# Patient Record
Sex: Male | Born: 1935
Health system: Southern US, Community
[De-identification: ages and names within clinical notes are randomized; demographics above are authoritative.]

## PROBLEM LIST (undated history)

## (undated) DIAGNOSIS — C61 Malignant neoplasm of prostate: Secondary | ICD-10-CM

## (undated) DIAGNOSIS — I10 Essential (primary) hypertension: Secondary | ICD-10-CM

## (undated) DIAGNOSIS — E119 Type 2 diabetes mellitus without complications: Secondary | ICD-10-CM

## (undated) HISTORY — DX: Malignant neoplasm of prostate: C61

---

## 2005-05-22 ENCOUNTER — Other Ambulatory Visit: Payer: Self-pay

## 2005-05-22 ENCOUNTER — Ambulatory Visit: Payer: Self-pay | Admitting: General Surgery

## 2005-05-27 ENCOUNTER — Ambulatory Visit: Payer: Self-pay | Admitting: General Surgery

## 2008-06-15 ENCOUNTER — Ambulatory Visit: Payer: Self-pay | Admitting: Internal Medicine

## 2010-10-11 ENCOUNTER — Ambulatory Visit: Payer: Self-pay | Admitting: Neurology

## 2013-12-09 ENCOUNTER — Ambulatory Visit: Payer: Self-pay | Admitting: Internal Medicine

## 2013-12-15 ENCOUNTER — Ambulatory Visit: Payer: Self-pay | Admitting: Urology

## 2013-12-20 LAB — CBC CANCER CENTER
Basophil #: 0 x10 3/mm (ref 0.0–0.1)
Basophil %: 0.7 %
EOS ABS: 0.5 x10 3/mm (ref 0.0–0.7)
EOS PCT: 6.4 %
HCT: 41.1 % (ref 40.0–52.0)
HGB: 13.7 g/dL (ref 13.0–18.0)
Lymphocyte #: 1.9 x10 3/mm (ref 1.0–3.6)
Lymphocyte %: 26.2 %
MCH: 31.8 pg (ref 26.0–34.0)
MCHC: 33.3 g/dL (ref 32.0–36.0)
MCV: 95 fL (ref 80–100)
MONO ABS: 0.6 x10 3/mm (ref 0.2–1.0)
MONOS PCT: 7.5 %
NEUTROS PCT: 59.2 %
Neutrophil #: 4.4 x10 3/mm (ref 1.4–6.5)
Platelet: 217 x10 3/mm (ref 150–440)
RBC: 4.31 10*6/uL — ABNORMAL LOW (ref 4.40–5.90)
RDW: 12.7 % (ref 11.5–14.5)
WBC: 7.4 x10 3/mm (ref 3.8–10.6)

## 2013-12-20 LAB — HEPATIC FUNCTION PANEL A (ARMC)
ALBUMIN: 4 g/dL (ref 3.4–5.0)
Alkaline Phosphatase: 68 U/L
Bilirubin,Total: 0.3 mg/dL (ref 0.2–1.0)
SGOT(AST): 15 U/L (ref 15–37)
SGPT (ALT): 24 U/L
TOTAL PROTEIN: 6.9 g/dL (ref 6.4–8.2)

## 2013-12-20 LAB — CREATININE, SERUM
CREATININE: 1.35 mg/dL — AB (ref 0.60–1.30)
EGFR (African American): >60
EGFR (Non-African Amer.): 54 — ABNORMAL LOW

## 2013-12-22 LAB — PSA: PSA: 13.6 ng/mL — AB (ref 0.0–4.0)

## 2014-01-06 LAB — HEPATIC FUNCTION PANEL A (ARMC)
Albumin: 3.7 g/dL (ref 3.4–5.0)
Alkaline Phosphatase: 81 U/L
BILIRUBIN DIRECT: 0.1 mg/dL (ref 0.0–0.2)
BILIRUBIN TOTAL: 0.3 mg/dL (ref 0.2–1.0)
SGOT(AST): 32 U/L (ref 15–37)
SGPT (ALT): 60 U/L
Total Protein: 7.2 g/dL (ref 6.4–8.2)

## 2014-01-06 LAB — CREATININE, SERUM
Creatinine: 1.28 mg/dL (ref 0.60–1.30)
EGFR (African American): >60
GFR CALC NON AF AMER: 58 — AB

## 2014-01-07 ENCOUNTER — Ambulatory Visit: Payer: Self-pay | Admitting: Internal Medicine

## 2014-01-19 LAB — HEPATIC FUNCTION PANEL A (ARMC)
ALBUMIN: 3.7 g/dL (ref 3.4–5.0)
Alkaline Phosphatase: 80 U/L
Bilirubin,Total: 0.3 mg/dL (ref 0.2–1.0)
SGOT(AST): 18 U/L (ref 15–37)
SGPT (ALT): 33 U/L
Total Protein: 7.2 g/dL (ref 6.4–8.2)

## 2014-02-07 ENCOUNTER — Ambulatory Visit: Payer: Self-pay | Admitting: Internal Medicine

## 2014-04-04 ENCOUNTER — Ambulatory Visit
Admit: 2014-04-04 | Disposition: A | Discharge status: Home / Self Care | Payer: Self-pay | Attending: Internal Medicine | Admitting: Internal Medicine

## 2014-04-04 LAB — CREATININE, SERUM
Creatinine: 1.17 mg/dL
EGFR (African American): >60
EGFR (Non-African Amer.): 59 — ABNORMAL LOW

## 2014-04-04 LAB — ALKALINE PHOSPHATASE: Alkaline Phosphatase: 61 U/L

## 2014-04-04 LAB — CANCER CENTER HEMOGLOBIN: HGB: 12.7 g/dL — AB (ref 13.0–18.0)

## 2014-04-05 LAB — PSA: PSA: 0.2 ng/mL (ref 0.0–4.0)

## 2014-04-08 ENCOUNTER — Ambulatory Visit
Admit: 2014-04-08 | Disposition: A | Discharge status: Home / Self Care | Payer: Self-pay | Attending: Internal Medicine | Admitting: Internal Medicine

## 2014-06-14 ENCOUNTER — Telehealth: Payer: Self-pay | Admitting: *Deleted

## 2014-06-15 NOTE — Telephone Encounter (Addendum)
Returned pt's call at 1230 on 06/15/14. He stated that his dose of Lupron SQ was on 04/04/14. This past week he begin to have an upset stomach with constipation; had dry heaves; and noted dizziness. His next treatment is scheduled in July. I told him that I would f/u with the NP regarding this and give him a call back this afternoon.  I mentioned to him that stomach upset and constipation were a side effect of Lupron; and I would f/u with the practitioner  regarding this... I returned pt's call at 1410 and advised him to try a stool softener and drink plenty of water; and that his symptoms were not a side effect of the Lupron injection as they would be noted earlier on~as discussed with Georgeanne Nim, AGNP-C.  He stated that he would drink plenty of fluids, but was not interested in taking a stool softener; is set to f/u with his PMD on next Friday.Marland KitchenMarland Kitchen

## 2014-07-08 ENCOUNTER — Other Ambulatory Visit: Payer: Self-pay | Admitting: *Deleted

## 2014-07-08 DIAGNOSIS — C61 Malignant neoplasm of prostate: Secondary | ICD-10-CM

## 2014-07-13 ENCOUNTER — Inpatient Hospital Stay: Payer: Medicare Other

## 2014-07-13 ENCOUNTER — Encounter: Payer: Self-pay | Admitting: Oncology

## 2014-07-13 ENCOUNTER — Inpatient Hospital Stay (HOSPITAL_BASED_OUTPATIENT_CLINIC_OR_DEPARTMENT_OTHER): Payer: Medicare Other | Admitting: Oncology

## 2014-07-13 ENCOUNTER — Inpatient Hospital Stay: Payer: Medicare Other | Attending: Oncology

## 2014-07-13 VITALS — BP 127/79 | HR 71 | Temp 96.4°F | Wt 226.0 lb

## 2014-07-13 DIAGNOSIS — Z79818 Long term (current) use of other agents affecting estrogen receptors and estrogen levels: Secondary | ICD-10-CM | POA: Insufficient documentation

## 2014-07-13 DIAGNOSIS — K5900 Constipation, unspecified: Secondary | ICD-10-CM

## 2014-07-13 DIAGNOSIS — F17210 Nicotine dependence, cigarettes, uncomplicated: Secondary | ICD-10-CM | POA: Diagnosis not present

## 2014-07-13 DIAGNOSIS — I10 Essential (primary) hypertension: Secondary | ICD-10-CM | POA: Insufficient documentation

## 2014-07-13 DIAGNOSIS — R5383 Other fatigue: Secondary | ICD-10-CM | POA: Diagnosis not present

## 2014-07-13 DIAGNOSIS — E785 Hyperlipidemia, unspecified: Secondary | ICD-10-CM | POA: Diagnosis not present

## 2014-07-13 DIAGNOSIS — E119 Type 2 diabetes mellitus without complications: Secondary | ICD-10-CM | POA: Insufficient documentation

## 2014-07-13 DIAGNOSIS — C61 Malignant neoplasm of prostate: Secondary | ICD-10-CM | POA: Insufficient documentation

## 2014-07-13 DIAGNOSIS — R599 Enlarged lymph nodes, unspecified: Secondary | ICD-10-CM | POA: Insufficient documentation

## 2014-07-13 DIAGNOSIS — R140 Abdominal distension (gaseous): Secondary | ICD-10-CM | POA: Diagnosis not present

## 2014-07-13 DIAGNOSIS — R531 Weakness: Secondary | ICD-10-CM

## 2014-07-13 HISTORY — DX: Malignant neoplasm of prostate: C61

## 2014-07-13 LAB — COMPREHENSIVE METABOLIC PANEL
ALT: 19 U/L (ref 17–63)
AST: 22 U/L (ref 15–41)
Albumin: 4 g/dL (ref 3.5–5.0)
Alkaline Phosphatase: 58 U/L (ref 38–126)
Anion gap: 10 (ref 5–15)
BUN: 23 mg/dL — AB (ref 6–20)
CALCIUM: 9.3 mg/dL (ref 8.9–10.3)
CO2: 24 mmol/L (ref 22–32)
CREATININE: 1.2 mg/dL (ref 0.61–1.24)
Chloride: 101 mmol/L (ref 101–111)
GFR calc Af Amer: >60 mL/min (ref >60)
GFR calc non Af Amer: 56 mL/min — ABNORMAL LOW (ref >60)
Glucose, Bld: 155 mg/dL — ABNORMAL HIGH (ref 65–99)
Potassium: 4.7 mmol/L (ref 3.5–5.1)
Sodium: 135 mmol/L (ref 135–145)
Total Bilirubin: 0.4 mg/dL (ref 0.3–1.2)
Total Protein: 7.4 g/dL (ref 6.5–8.1)

## 2014-07-13 LAB — CBC WITH DIFFERENTIAL/PLATELET
Basophils Absolute: 0 10*3/uL (ref 0–0.1)
Basophils Relative: 0 %
Eosinophils Absolute: 0.1 10*3/uL (ref 0–0.7)
Eosinophils Relative: 2 %
HCT: 38.1 % — ABNORMAL LOW (ref 40.0–52.0)
Hemoglobin: 12.6 g/dL — ABNORMAL LOW (ref 13.0–18.0)
LYMPHS ABS: 2.4 10*3/uL (ref 1.0–3.6)
LYMPHS PCT: 31 %
MCH: 30.6 pg (ref 26.0–34.0)
MCHC: 33 g/dL (ref 32.0–36.0)
MCV: 92.8 fL (ref 80.0–100.0)
MONO ABS: 0.5 10*3/uL (ref 0.2–1.0)
MONOS PCT: 6 %
NEUTROS ABS: 4.8 10*3/uL (ref 1.4–6.5)
NEUTROS PCT: 61 %
PLATELETS: 239 10*3/uL (ref 150–440)
RBC: 4.1 MIL/uL — ABNORMAL LOW (ref 4.40–5.90)
RDW: 13 % (ref 11.5–14.5)
WBC: 7.9 10*3/uL (ref 3.8–10.6)

## 2014-07-13 LAB — PSA: PSA: 0.12 ng/mL (ref 0.00–4.00)

## 2014-07-13 MED ORDER — LEUPROLIDE ACETATE (3 MONTH) 22.5 MG IM KIT
22.5000 mg | PACK | Freq: Once | INTRAMUSCULAR | Status: AC
  Administered 2014-07-13: 22.5 mg via INTRAMUSCULAR
  Filled 2014-07-13: qty 22.5

## 2014-07-13 NOTE — Progress Notes (Signed)
Does not have a living will.  Currently smokes. Patient requesting handicapped sticker.

## 2014-07-17 ENCOUNTER — Encounter: Payer: Self-pay | Admitting: Oncology

## 2014-07-17 NOTE — Progress Notes (Signed)
[place] @ [facility] Telephone:(336) [phone]  Fax:(336) [place]: 13-Mar-1933  MR#: [id number]  DXI#:[id number]  No care team member to display  CHIEF COMPLAINT:  Chief Complaint  Patient presents with  . Follow-up    Oncology History   1. prostate cancer, extra capsule extention, also illiac and retroperitoneal adenopathy on ct, small lesion liver possible metastatic, minimal uptake one area t spine. Asymptomatic. Not a surgical candidate with CT findings. Considdered not bulk and not visceral disease. 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  Since last evaluation patient has developed abdominal distention.  No bony pain.  Family is present.  Constipation.  Patient is taking stool softener as needed and no fibers.  Continues to smoke  REVIEW OF SYSTEMS:    general status: Patient is feeling weak and tired.  No change in a performance status.  No chills.  No fever. HEENT:   No evidence of stomatitis Lungs: No cough or shortness of breath Cardiac: No chest pain or paroxysmal nocturnal dyspnea GI: No nausea no vomiting no diarrhea no abdominal pain Complains of abdominal distention and constipation Skin: No rash Lower extremity no swelling Neurological system: No tingling.  No numbness.  No other focal signs Musculoskeletal system no bony pains  As per HPI. Otherwise, a complete review of systems is negatve.  PAST MEDICAL HISTORY: Past Medical History  Diagnosis Date  . Cancer of prostate 07/13/2014    Significant History/PMH:   Cataracts:    Hypertension:    Diabetes:    Appendectomy:   Preventive Screening:  Has patient had any of the following test? Colonscopy  Prostate Exam (1)   Last  Colonoscopy: Never(1)   Last Prostate Exam: Biopsy on 11/26/13, PSA 8.2(1)   Smoking History: Smoking History 1(1)Packs per day and Has been smoking for 68 years(1)  PFSH: Comments: no change see initial consult, ca prostate father    Social History: negative alcohol  Additional Past Medical and Surgical History: hyperlipidemia   ADVANCED DIRECTIVES: Patient does not have any living will or healthcare power of attorney.  Information was given .  Available resources had been discussed.  We will follow-up on subsequent appointments regarding this issue HEALTH MAINTENANCE: History  Substance Use Topics  . Smoking status: Current Every Day Smoker  . Smokeless tobacco: Not on file  . Alcohol Use: Not on file      No Known Allergies  No current outpatient prescriptions on file.   No current facility-administered medications for this visit.    OBJECTIVE:  Filed Vitals:   07/13/14 1531  BP: 127/79  Pulse: 71  Temp: 96.4 F (35.8 C)     There is no height on file to calculate BMI.    ECOG FS:1 - Symptomatic but completely ambulatory  PHYSICAL EXAM: General status: Performance status is good.  Patient has not lost significant weight HEENT: No evidence of stomatitis. Sclera and conjunctivae :: No jaundice.   pale looking. Lungs: Air  entry equal on both sides.  No rhonchi.  No rales.  Cardiac: Heart sounds are normal.  No pericardial rub.  No murmur. Lymphatic system: Cervical, axillary, inguinal, lymph nodes not palpable GI: Abdomen is soft.  No ascites.  Liver spleen not palpable.  No tenderness.  Bowel sounds are within normal limit Abdominal distention bowel sounds are present.  Liver and spleen not palpable no palpable masses Lower extremity: No edema Neurological system: Higher functions, cranial nerves intact no evidence of peripheral neuropathy. Skin: No rash.  No ecchymosis.Marland Kitchen   LAB RESULTS:  Appointment on 07/13/2014  Component Date Value Ref Range Status  . WBC  07/13/2014 7.9  3.8 - 10.6 K/uL Final  . RBC 07/13/2014 4.10* 4.40 - 5.90 MIL/uL Final  . Hemoglobin 07/13/2014 12.6* 13.0 - 18.0 g/dL Final  . HCT 07/13/2014 38.1* 40.0 - 52.0 % Final  . MCV 07/13/2014 92.8  80.0 - 100.0 fL Final  . MCH 07/13/2014 30.6  26.0 - 34.0 pg Final  . MCHC 07/13/2014 33.0  32.0 - 36.0 g/dL Final  . RDW 07/13/2014 13.0  11.5 - 14.5 % Final  . Platelets 07/13/2014 239  150 - 440 K/uL Final  . Neutrophils Relative % 07/13/2014 61   Final  . Neutro Abs 07/13/2014 4.8  1.4 - 6.5 K/uL Final  . Lymphocytes Relative 07/13/2014 31   Final  . Lymphs Abs 07/13/2014 2.4  1.0 - 3.6 K/uL Final  . Monocytes Relative 07/13/2014 6   Final  . Monocytes Absolute 07/13/2014 0.5  0.2 - 1.0 K/uL Final  . Eosinophils Relative 07/13/2014 2   Final  . Eosinophils Absolute 07/13/2014 0.1  0 - 0.7 K/uL Final  . Basophils Relative 07/13/2014 0   Final  . Basophils Absolute 07/13/2014 0.0  0 - 0.1 K/uL Final  . Sodium 07/13/2014 135  135 - 145 mmol/L Final  . Potassium 07/13/2014 4.7  3.5 - 5.1 mmol/L Final  . Chloride 07/13/2014 101  101 - 111 mmol/L Final  . CO2 07/13/2014 24  22 - 32 mmol/L Final  . Glucose, Bld 07/13/2014 155* 65 - 99 mg/dL Final  . BUN 07/13/2014 23* 6 - 20 mg/dL Final  . Creatinine, Ser 07/13/2014 1.20  0.61 - 1.24 mg/dL Final  . Calcium 07/13/2014 9.3  8.9 - 10.3 mg/dL Final  . Total Protein 07/13/2014 7.4  6.5 - 8.1 g/dL Final  . Albumin 07/13/2014 4.0  3.5 - 5.0 g/dL Final  . AST 07/13/2014 22  15 - 41 U/L Final  . ALT 07/13/2014 19  17 - 63 U/L Final  . Alkaline Phosphatase 07/13/2014 58  38 - 126 U/L Final  . Total Bilirubin 07/13/2014 0.4  0.3 - 1.2 mg/dL Final  . GFR calc non Af Amer 07/13/2014 56* >60 mL/min Final  . GFR calc Af Amer 07/13/2014 >60  >60 mL/min Final   Comment: (NOTE) The eGFR has been calculated using the CKD EPI equation. This calculation has not been validated in all clinical situations. eGFR's persistently <60 mL/min signify  possible Chronic Kidney Disease.   . Anion gap 07/13/2014 10  5 - 15 Final  . PSA 07/13/2014 0.12  0.00 - 4.00 ng/mL Final   Comment: (NOTE) While PSA levels of <=4.0 ng/ml are reported as reference range, some men with levels below 4.0 ng/ml can have prostate cancer and many men with PSA above 4.0 ng/ml do not have prostate cancer.  Other tests such as free PSA, age specific reference ranges, PSA velocity and PSA doubling time may be helpful especially in men less than 31 years old. Performed at [facility]      ASSESSMENT: Carcinoma prostate retroperitoneal adenopathy by PSA criteria patient is responding to the treatment MEDICAL DECISION  MAKING:  Lab data has been reviewed.  Continue Lupron.  In view of abdominal distention is CT scan of abdomen has been ordered.  Will be reviewed.  If there is abnormality will call patient. Patient was also advised regarding constipation to take stool softener and fibrosis and a regular basis Total duration of visit was 35  minutes.  50% or more time was spent in counseling patient and family regarding prognosis and options of treatment and available resources  Patient expressed understanding and was in agreement with this plan. He also understands that He can call clinic at any time with any questions, concerns, or complaints.    No matching staging information was found for the patient.  Forest Gleason, MD   07/17/2014 8:40 AM

## 2014-07-18 ENCOUNTER — Telehealth: Payer: Self-pay | Admitting: *Deleted

## 2014-07-18 NOTE — Telephone Encounter (Signed)
Called patient to inform him that his PSA level is coming down.  MD will continue current tx and plan.  Patient verbalized understanding.

## 2014-07-18 NOTE — Telephone Encounter (Signed)
PSA is trending down. Continue with current treatment plan.

## 2014-07-20 ENCOUNTER — Ambulatory Visit
Admission: RE | Admit: 2014-07-20 | Discharge: 2014-07-20 | Disposition: A | Discharge status: Home / Self Care | Payer: Medicare Other | Source: Ambulatory Visit | Attending: Oncology | Admitting: Oncology

## 2014-07-20 DIAGNOSIS — C61 Malignant neoplasm of prostate: Secondary | ICD-10-CM

## 2014-07-20 DIAGNOSIS — K8020 Calculus of gallbladder without cholecystitis without obstruction: Secondary | ICD-10-CM | POA: Insufficient documentation

## 2014-07-20 HISTORY — DX: Essential (primary) hypertension: I10

## 2014-07-20 HISTORY — DX: Type 2 diabetes mellitus without complications: E11.9

## 2014-07-20 MED ORDER — IOHEXOL 300 MG/ML  SOLN
100.0000 mL | Freq: Once | INTRAMUSCULAR | Status: AC | PRN
  Administered 2014-07-20: 100 mL via INTRAVENOUS

## 2014-10-18 ENCOUNTER — Inpatient Hospital Stay (HOSPITAL_BASED_OUTPATIENT_CLINIC_OR_DEPARTMENT_OTHER): Payer: Medicare Other | Admitting: Family Medicine

## 2014-10-18 ENCOUNTER — Inpatient Hospital Stay: Payer: Medicare Other | Attending: Oncology

## 2014-10-18 ENCOUNTER — Inpatient Hospital Stay: Payer: Medicare Other

## 2014-10-18 VITALS — BP 125/74 | HR 90 | Temp 97.1°F | Wt 227.3 lb

## 2014-10-18 DIAGNOSIS — C61 Malignant neoplasm of prostate: Secondary | ICD-10-CM

## 2014-10-18 DIAGNOSIS — I10 Essential (primary) hypertension: Secondary | ICD-10-CM | POA: Diagnosis not present

## 2014-10-18 DIAGNOSIS — R590 Localized enlarged lymph nodes: Secondary | ICD-10-CM | POA: Diagnosis not present

## 2014-10-18 DIAGNOSIS — Z7982 Long term (current) use of aspirin: Secondary | ICD-10-CM

## 2014-10-18 DIAGNOSIS — Z79899 Other long term (current) drug therapy: Secondary | ICD-10-CM | POA: Insufficient documentation

## 2014-10-18 DIAGNOSIS — Z87891 Personal history of nicotine dependence: Secondary | ICD-10-CM | POA: Diagnosis not present

## 2014-10-18 DIAGNOSIS — M2550 Pain in unspecified joint: Secondary | ICD-10-CM | POA: Insufficient documentation

## 2014-10-18 DIAGNOSIS — Z79818 Long term (current) use of other agents affecting estrogen receptors and estrogen levels: Secondary | ICD-10-CM | POA: Diagnosis not present

## 2014-10-18 DIAGNOSIS — E119 Type 2 diabetes mellitus without complications: Secondary | ICD-10-CM

## 2014-10-18 DIAGNOSIS — K769 Liver disease, unspecified: Secondary | ICD-10-CM | POA: Diagnosis not present

## 2014-10-18 LAB — COMPREHENSIVE METABOLIC PANEL
ALBUMIN: 4.1 g/dL (ref 3.5–5.0)
ALT: 27 U/L (ref 17–63)
AST: 24 U/L (ref 15–41)
Alkaline Phosphatase: 64 U/L (ref 38–126)
Anion gap: 6 (ref 5–15)
BUN: 24 mg/dL — AB (ref 6–20)
CO2: 24 mmol/L (ref 22–32)
CREATININE: 1.28 mg/dL — AB (ref 0.61–1.24)
Calcium: 9 mg/dL (ref 8.9–10.3)
Chloride: 104 mmol/L (ref 101–111)
GFR calc Af Amer: 60 mL/min — ABNORMAL LOW (ref >60)
GFR, EST NON AFRICAN AMERICAN: 52 mL/min — AB (ref >60)
GLUCOSE: 162 mg/dL — AB (ref 65–99)
POTASSIUM: 4.2 mmol/L (ref 3.5–5.1)
Sodium: 134 mmol/L — ABNORMAL LOW (ref 135–145)
Total Bilirubin: 0.4 mg/dL (ref 0.3–1.2)
Total Protein: 7.5 g/dL (ref 6.5–8.1)

## 2014-10-18 LAB — PSA: PSA: 0.11 ng/mL (ref 0.00–4.00)

## 2014-10-18 LAB — CBC WITH DIFFERENTIAL/PLATELET
BASOS ABS: 0 10*3/uL (ref 0–0.1)
BASOS PCT: 0 %
Eosinophils Absolute: 0.1 10*3/uL (ref 0–0.7)
Eosinophils Relative: 1 %
HCT: 38.7 % — ABNORMAL LOW (ref 40.0–52.0)
Hemoglobin: 13.1 g/dL (ref 13.0–18.0)
LYMPHS PCT: 23 %
Lymphs Abs: 2.5 10*3/uL (ref 1.0–3.6)
MCH: 30.5 pg (ref 26.0–34.0)
MCHC: 33.9 g/dL (ref 32.0–36.0)
MCV: 89.9 fL (ref 80.0–100.0)
Monocytes Absolute: 0.6 10*3/uL (ref 0.2–1.0)
Monocytes Relative: 6 %
NEUTROS ABS: 7.5 10*3/uL — AB (ref 1.4–6.5)
Neutrophils Relative %: 70 %
Platelets: 243 10*3/uL (ref 150–440)
RBC: 4.3 MIL/uL — AB (ref 4.40–5.90)
RDW: 13.7 % (ref 11.5–14.5)
WBC: 10.8 10*3/uL — AB (ref 3.8–10.6)

## 2014-10-18 MED ORDER — LEUPROLIDE ACETATE (3 MONTH) 22.5 MG IM KIT
22.5000 mg | PACK | Freq: Once | INTRAMUSCULAR | Status: AC
  Administered 2014-10-18: 22.5 mg via INTRAMUSCULAR
  Filled 2014-10-18: qty 22.5

## 2014-10-18 NOTE — Progress Notes (Signed)
Patient does not have living will.  Currently smokes. 

## 2014-10-24 NOTE — Progress Notes (Signed)
[place]  Telephone:(336) [phone]  Fax:(336) [place] DOB: 17-Aug-1933  MR#: [id number]  PYP#:[id number]  Patient Care Team: Pcp Not In System as PCP - General   Patient seen on October 18, 2014.  CHIEF COMPLAINT:  Chief Complaint  Patient presents with  . OTHER   Lupron therapy for prostate cancer.   INTERVAL HISTORY:  Patient is here for continued follow-up and treatment consideration regarding prostate cancer. Patient is for Lupron 22.5 mg today which he receives every 3 months. He reports having some joint pain mainly in the knees, as well as hot flashes from therapy. He reports having some occasional constipation. He overall feels fairly well and denies any other acute complaints.  REVIEW OF SYSTEMS:   Review of Systems  Constitutional: Negative for fever, chills, weight loss, malaise/fatigue and diaphoresis.       Hot flashes  HENT: Negative for congestion, ear discharge, ear pain, hearing loss, nosebleeds, sore throat and tinnitus.   Eyes: Negative for blurred vision, double vision, photophobia, pain, discharge and redness.  Respiratory: Negative for cough, hemoptysis, sputum production, shortness of breath, wheezing and stridor.   Cardiovascular: Negative for chest pain, palpitations, orthopnea, claudication, leg swelling and PND.  Gastrointestinal: Negative for heartburn, nausea, vomiting, abdominal pain, diarrhea, constipation, blood in stool and melena.  Genitourinary: Negative.   Musculoskeletal: Positive for joint pain.  Skin: Negative.   Neurological: Negative for dizziness, tingling, focal weakness, seizures, weakness and headaches.  Endo/Heme/Allergies: Does not bruise/bleed easily.  Psychiatric/Behavioral: Negative for depression. The patient is not nervous/anxious and does not have insomnia.     As per HPI. Otherwise, a complete review of systems is negatve.  ONCOLOGY HISTORY: Oncology History   1. prostate cancer,  extra capsule extention, also illiac and retroperitoneal adenopathy on ct, small lesion liver possible metastatic, minimal uptake one area t spine. Asymptomatic. Not a surgical candidate with CT findings. Considdered not bulk and not visceral disease. Psa 13.6 prior to tx...casodex , then lupron first dose 12/31     Cancer of prostate [facility])    PAST MEDICAL HISTORY: Past Medical History  Diagnosis Date  . Cancer of prostate 07/13/2014  . Diabetes mellitus without complication     Patient takes Metformin  . Hypertension     PAST SURGICAL HISTORY: No past surgical history on file.  FAMILY HISTORY No family history on file.  GYNECOLOGIC HISTORY:  No LMP for male patient.     ADVANCED DIRECTIVES:    HEALTH MAINTENANCE: Social History  Substance Use Topics  . Smoking status: Current Every Day Smoker  . Smokeless tobacco: Not on file  . Alcohol Use: Not on file     Colonoscopy:  PAP:  Bone density:  Lipid panel:  No Known Allergies  Current Outpatient Prescriptions  Medication Sig Dispense Refill  . aspirin 325 MG EC tablet Take 325 mg by mouth daily.    . fluvastatin (LESCOL) 20 MG capsule Take 20 mg by mouth at bedtime.    Marland Kitchen lisinopril-hydrochlorothiazide (PRINZIDE,ZESTORETIC) 10-12.5 MG tablet Take 1 tablet by mouth daily.    . metFORMIN (GLUCOPHAGE) 500 MG tablet Take by mouth 1 day or 1 dose.    . niacin (NIASPAN) 500 MG CR tablet Take 500 mg by mouth at bedtime.    . Omega-3 Fatty Acids (FISH OIL) 1000 MG CAPS Take by mouth 4 (four) times daily.    . tamsulosin (FLOMAX) 0.4 MG CAPS capsule Take 0.4 mg by mouth  2 (two) times daily.     No current facility-administered medications for this visit.    OBJECTIVE: BP 125/74 mmHg  Pulse 90  Temp(Src) 97.1 F (36.2 C) (Tympanic)  Wt 227 lb 4.7 oz (103.1 kg)   There is no height on file to calculate BMI.    ECOG FS:1 - Symptomatic but completely ambulatory  General: Well-developed, well-nourished, no acute  distress. Eyes: Pink conjunctiva, anicteric sclera. HEENT: Normocephalic, moist mucous membranes, clear oropharnyx. Lungs: Clear to auscultation bilaterally. Heart: Regular rate and rhythm. No rubs, murmurs, or gallops. Musculoskeletal: No edema, cyanosis, or clubbing. Occasional knee and joint pain. Neuro: Alert, answering all questions appropriately. Cranial nerves grossly intact. Skin: No rashes or petechiae noted. Psych: Normal affect.   LAB RESULTS:  Appointment on 10/18/2014  Component Date Value Ref Range Status  . WBC 10/18/2014 10.8* 3.8 - 10.6 K/uL Final  . RBC 10/18/2014 4.30* 4.40 - 5.90 MIL/uL Final  . Hemoglobin 10/18/2014 13.1  13.0 - 18.0 g/dL Final  . HCT 10/18/2014 38.7* 40.0 - 52.0 % Final  . MCV 10/18/2014 89.9  80.0 - 100.0 fL Final  . MCH 10/18/2014 30.5  26.0 - 34.0 pg Final  . MCHC 10/18/2014 33.9  32.0 - 36.0 g/dL Final  . RDW 10/18/2014 13.7  11.5 - 14.5 % Final  . Platelets 10/18/2014 243  150 - 440 K/uL Final  . Neutrophils Relative % 10/18/2014 70   Final  . Neutro Abs 10/18/2014 7.5* 1.4 - 6.5 K/uL Final  . Lymphocytes Relative 10/18/2014 23   Final  . Lymphs Abs 10/18/2014 2.5  1.0 - 3.6 K/uL Final  . Monocytes Relative 10/18/2014 6   Final  . Monocytes Absolute 10/18/2014 0.6  0.2 - 1.0 K/uL Final  . Eosinophils Relative 10/18/2014 1   Final  . Eosinophils Absolute 10/18/2014 0.1  0 - 0.7 K/uL Final  . Basophils Relative 10/18/2014 0   Final  . Basophils Absolute 10/18/2014 0.0  0 - 0.1 K/uL Final  . Sodium 10/18/2014 134* 135 - 145 mmol/L Final  . Potassium 10/18/2014 4.2  3.5 - 5.1 mmol/L Final  . Chloride 10/18/2014 104  101 - 111 mmol/L Final  . CO2 10/18/2014 24  22 - 32 mmol/L Final  . Glucose, Bld 10/18/2014 162* 65 - 99 mg/dL Final  . BUN 10/18/2014 24* 6 - 20 mg/dL Final  . Creatinine, Ser 10/18/2014 1.28* 0.61 - 1.24 mg/dL Final  . Calcium 10/18/2014 9.0  8.9 - 10.3 mg/dL Final  . Total Protein 10/18/2014 7.5  6.5 - 8.1 g/dL Final   . Albumin 10/18/2014 4.1  3.5 - 5.0 g/dL Final  . AST 10/18/2014 24  15 - 41 U/L Final  . ALT 10/18/2014 27  17 - 63 U/L Final  . Alkaline Phosphatase 10/18/2014 64  38 - 126 U/L Final  . Total Bilirubin 10/18/2014 0.4  0.3 - 1.2 mg/dL Final  . GFR calc non Af Amer 10/18/2014 52* >60 mL/min Final  . GFR calc Af Amer 10/18/2014 60* >60 mL/min Final   Comment: (NOTE) The eGFR has been calculated using the CKD EPI equation. This calculation has not been validated in all clinical situations. eGFR's persistently <60 mL/min signify possible Chronic Kidney Disease.   . Anion gap 10/18/2014 6  5 - 15 Final  . PSA 10/18/2014 0.11  0.00 - 4.00 ng/mL Final   Comment: (NOTE) While PSA levels of <=4.0 ng/ml are reported as reference range, some men with levels below 4.0 ng/ml can  have prostate cancer and many men with PSA above 4.0 ng/ml do not have prostate cancer.  Other tests such as free PSA, age specific reference ranges, PSA velocity and PSA doubling time may be helpful especially in men less than 22 years old. Performed at [facility]: No results found.  ASSESSMENT:  Prostate cancer, with retroperitoneal adenopathy.  PLAN:   1. Prostate CA. We'll proceed with Lupron 22.5 mg every 3 months. Most recent PSA was drawn on October 11 and reported as 0.11, patient called with these results. We will continue with follow-up in 3 months with Dr. Oliva Bustard.  Patient expressed understanding and was in agreement with this plan. He also understands that He can call clinic at any time with any questions, concerns, or complaints.   Dr. Oliva Bustard was available for consultation and review of plan of care for this patient.  Evlyn Kanner, NP   10/18/2014 10:44 AM

## 2015-01-05 ENCOUNTER — Other Ambulatory Visit: Payer: Self-pay | Admitting: Nurse Practitioner

## 2015-01-17 ENCOUNTER — Other Ambulatory Visit: Payer: Self-pay | Admitting: *Deleted

## 2015-01-17 DIAGNOSIS — C61 Malignant neoplasm of prostate: Secondary | ICD-10-CM

## 2015-01-25 ENCOUNTER — Encounter: Payer: Self-pay | Admitting: Oncology

## 2015-01-25 ENCOUNTER — Inpatient Hospital Stay: Payer: Medicare Other | Attending: Oncology | Admitting: Oncology

## 2015-01-25 ENCOUNTER — Inpatient Hospital Stay: Payer: Medicare Other

## 2015-01-25 VITALS — BP 106/71 | HR 109 | Temp 97.5°F | Resp 18 | Wt 222.7 lb

## 2015-01-25 DIAGNOSIS — R531 Weakness: Secondary | ICD-10-CM | POA: Diagnosis not present

## 2015-01-25 DIAGNOSIS — Z79899 Other long term (current) drug therapy: Secondary | ICD-10-CM | POA: Diagnosis not present

## 2015-01-25 DIAGNOSIS — Z79818 Long term (current) use of other agents affecting estrogen receptors and estrogen levels: Secondary | ICD-10-CM

## 2015-01-25 DIAGNOSIS — K5900 Constipation, unspecified: Secondary | ICD-10-CM | POA: Diagnosis not present

## 2015-01-25 DIAGNOSIS — R109 Unspecified abdominal pain: Secondary | ICD-10-CM | POA: Diagnosis not present

## 2015-01-25 DIAGNOSIS — Z8042 Family history of malignant neoplasm of prostate: Secondary | ICD-10-CM

## 2015-01-25 DIAGNOSIS — F17210 Nicotine dependence, cigarettes, uncomplicated: Secondary | ICD-10-CM

## 2015-01-25 DIAGNOSIS — K769 Liver disease, unspecified: Secondary | ICD-10-CM | POA: Diagnosis not present

## 2015-01-25 DIAGNOSIS — R590 Localized enlarged lymph nodes: Secondary | ICD-10-CM | POA: Diagnosis not present

## 2015-01-25 DIAGNOSIS — E785 Hyperlipidemia, unspecified: Secondary | ICD-10-CM

## 2015-01-25 DIAGNOSIS — C61 Malignant neoplasm of prostate: Secondary | ICD-10-CM

## 2015-01-25 DIAGNOSIS — E119 Type 2 diabetes mellitus without complications: Secondary | ICD-10-CM | POA: Diagnosis not present

## 2015-01-25 DIAGNOSIS — R5383 Other fatigue: Secondary | ICD-10-CM | POA: Diagnosis not present

## 2015-01-25 DIAGNOSIS — I10 Essential (primary) hypertension: Secondary | ICD-10-CM | POA: Diagnosis not present

## 2015-01-25 DIAGNOSIS — Z7984 Long term (current) use of oral hypoglycemic drugs: Secondary | ICD-10-CM

## 2015-01-25 LAB — CBC WITH DIFFERENTIAL/PLATELET
BASOS PCT: 1 %
Basophils Absolute: 0 10*3/uL (ref 0–0.1)
EOS ABS: 0.1 10*3/uL (ref 0–0.7)
EOS PCT: 1 %
HCT: 39 % — ABNORMAL LOW (ref 40.0–52.0)
HEMOGLOBIN: 13.1 g/dL (ref 13.0–18.0)
LYMPHS ABS: 2.2 10*3/uL (ref 1.0–3.6)
Lymphocytes Relative: 23 %
MCH: 29.8 pg (ref 26.0–34.0)
MCHC: 33.5 g/dL (ref 32.0–36.0)
MCV: 89.1 fL (ref 80.0–100.0)
MONO ABS: 0.6 10*3/uL (ref 0.2–1.0)
MONOS PCT: 6 %
NEUTROS PCT: 69 %
Neutro Abs: 6.5 10*3/uL (ref 1.4–6.5)
Platelets: 234 10*3/uL (ref 150–440)
RBC: 4.38 MIL/uL — ABNORMAL LOW (ref 4.40–5.90)
RDW: 13.6 % (ref 11.5–14.5)
WBC: 9.4 10*3/uL (ref 3.8–10.6)

## 2015-01-25 LAB — COMPREHENSIVE METABOLIC PANEL
ALK PHOS: 71 U/L (ref 38–126)
ALT: 25 U/L (ref 17–63)
ANION GAP: 11 (ref 5–15)
AST: 24 U/L (ref 15–41)
Albumin: 3.8 g/dL (ref 3.5–5.0)
BUN: 21 mg/dL — ABNORMAL HIGH (ref 6–20)
CALCIUM: 9.3 mg/dL (ref 8.9–10.3)
CO2: 22 mmol/L (ref 22–32)
Chloride: 102 mmol/L (ref 101–111)
Creatinine, Ser: 1.18 mg/dL (ref 0.61–1.24)
GFR calc Af Amer: >60 mL/min (ref >60)
GFR calc non Af Amer: 57 mL/min — ABNORMAL LOW (ref >60)
Glucose, Bld: 167 mg/dL — ABNORMAL HIGH (ref 65–99)
POTASSIUM: 3.9 mmol/L (ref 3.5–5.1)
SODIUM: 135 mmol/L (ref 135–145)
Total Bilirubin: 0.4 mg/dL (ref 0.3–1.2)
Total Protein: 7.3 g/dL (ref 6.5–8.1)

## 2015-01-25 LAB — PSA: PSA: 0.19 ng/mL (ref 0.00–4.00)

## 2015-01-25 MED ORDER — LEUPROLIDE ACETATE (3 MONTH) 22.5 MG IM KIT
22.5000 mg | PACK | Freq: Once | INTRAMUSCULAR | Status: AC
  Administered 2015-01-25: 22.5 mg via INTRAMUSCULAR
  Filled 2015-01-25: qty 22.5

## 2015-01-25 MED ORDER — OMEPRAZOLE 20 MG PO CPDR: 20.0000 mg | DELAYED_RELEASE_CAPSULE | Freq: Every day | ORAL | Status: DC

## 2015-01-25 NOTE — Progress Notes (Signed)
[place] @ [facility] Telephone:(336) [phone]  Fax:(336) [place]: [birth year]  MR#: [id number]  CHE#:[id number]  Patient Care Team: Pcp Not In System as PCP - General  CHIEF COMPLAINT:  Chief Complaint  Patient presents with  . Prostate Cancer   Oncology History   1. prostate cancer, extra capsule extention, also illiac and retroperitoneal adenopathy on ct, small lesion liver possible metastatic, minimal uptake one area t spine. Asymptomatic. Not a surgical candidate with CT findings. Considdered not bulky  and non  visceral disease. Psa 13.6 prior to tx...casodex , then lupron first dose 12/31     Oncology History   1. prostate cancer, extra capsule extention, also illiac and retroperitoneal adenopathy on ct, small lesion liver possible metastatic, minimal uptake one area t spine. Asymptomatic. Not a surgical candidate with CT findings. Considdered not bulk and not visceral disease. 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  Since last evaluation patient has developed abdominal distention.  No bony pain.  Family is present.  Constipation.  Patient is taking stool softener as needed and no fibers.  Continues to smoke   patient came today further follow-up.  Feeling much better.  Her abdominal pain.  No nausea no vomiting no diarrhea.  CT scan done in July did not reveal any bulky retroperitoneal lymphadenopathy.  Patient is responding to the Lupron   REVIEW OF SYSTEMS:    general status: Patient is feeling weak and tired.  No change in a performance status.  No chills.  No fever. HEENT:   No evidence of stomatitis Lungs: No cough or shortness of breath Cardiac: No chest pain or  paroxysmal nocturnal dyspnea GI: No nausea no vomiting no diarrhea no abdominal pain Complains of abdominal distention and constipation Skin: No rash Lower extremity no swelling Neurological system: No tingling.  No numbness.  No other focal signs Musculoskeletal system no bony pains  As per HPI. Otherwise, a complete review of systems is negatve.  PAST MEDICAL HISTORY: Past Medical History  Diagnosis Date  . Cancer of prostate ([place]) 07/13/2014  . Diabetes mellitus without complication [facility])     Patient takes Metformin  . Hypertension     Significant History/PMH:   Cataracts:    Hypertension:    Diabetes:    Appendectomy:   Preventive Screening:  Has patient had any of the following test? Colonscopy  Prostate Exam (1)   Last Colonoscopy: Never(1)   Last Prostate Exam: Biopsy on 11/26/13, PSA 8.2(1)   Smoking History: Smoking History 1(1)Packs per day and Has been smoking for 68 years(1)  PFSH: Comments: no change see initial consult, ca prostate father    Social History: negative alcohol  Additional Past Medical and Surgical History: hyperlipidemia   ADVANCED DIRECTIVES: Patient does not have any living will or healthcare power of attorney.  Information was given .  Available resources had been discussed.  We will follow-up on subsequent appointments regarding this issue HEALTH MAINTENANCE: Social History  Substance Use Topics  . Smoking status: Current Every Day Smoker  . Smokeless tobacco: None  . Alcohol Use: None      No Known Allergies  Current Outpatient Prescriptions  Medication Sig Dispense  Refill  . aspirin 325 MG EC tablet Take 325 mg by mouth daily.    . fluvastatin (LESCOL) 20 MG capsule Take 20 mg by mouth at bedtime.    Marland Kitchen lisinopril-hydrochlorothiazide (PRINZIDE,ZESTORETIC) 10-12.5 MG tablet Take 1 tablet by mouth daily.    . metFORMIN (GLUCOPHAGE) 500 MG tablet Take by mouth 1 day or 1 dose.    . niacin (NIASPAN) 500 MG CR tablet Take 500  mg by mouth at bedtime.    . Omega-3 Fatty Acids (FISH OIL) 1000 MG CAPS Take by mouth 4 (four) times daily.    . tamsulosin (FLOMAX) 0.4 MG CAPS capsule Take 0.4 mg by mouth 2 (two) times daily.    Marland Kitchen lisinopril-hydrochlorothiazide (PRINZIDE,ZESTORETIC) 20-12.5 MG tablet      No current facility-administered medications for this visit.    OBJECTIVE:  Filed Vitals:   01/25/15 1457  BP: 106/71  Pulse: 109  Temp: 97.5 F (36.4 C)  Resp: 18     There is no height on file to calculate BMI.    ECOG FS:1 - Symptomatic but completely ambulatory  PHYSICAL EXAM: General status: Performance status is good.  Patient has not lost significant weight HEENT: No evidence of stomatitis. Sclera and conjunctivae :: No jaundice.   pale looking. Lungs: Air  entry equal on both sides.  No rhonchi.  No rales.  Cardiac: Heart sounds are normal.  No pericardial rub.  No murmur. Lymphatic system: Cervical, axillary, inguinal, lymph nodes not palpable GI: Abdomen is soft.  No ascites.  Liver spleen not palpable.  No tenderness.  Bowel sounds are within normal limit Abdominal distention bowel sounds are present.  Liver and spleen not palpable no palpable masses Lower extremity: No edema Neurological system: Higher functions, cranial nerves intact no evidence of peripheral neuropathy. Skin: No rash.  No ecchymosis.Marland Kitchen   LAB RESULTS:  Appointment on 01/25/2015  Component Date Value Ref Range Status  . WBC 01/25/2015 9.4  3.8 - 10.6 K/uL Final  . RBC 01/25/2015 4.38* 4.40 - 5.90 MIL/uL Final  . Hemoglobin 01/25/2015 13.1  13.0 - 18.0 g/dL Final  . HCT 01/25/2015 39.0* 40.0 - 52.0 % Final  . MCV 01/25/2015 89.1  80.0 - 100.0 fL Final  . MCH 01/25/2015 29.8  26.0 - 34.0 pg Final  . MCHC 01/25/2015 33.5  32.0 - 36.0 g/dL Final  . RDW 01/25/2015 13.6  11.5 - 14.5 % Final  . Platelets 01/25/2015 234  150 - 440 K/uL Final  . Neutrophils Relative % 01/25/2015 69   Final  . Neutro Abs 01/25/2015 6.5  1.4 - 6.5  K/uL Final  . Lymphocytes Relative 01/25/2015 23   Final  . Lymphs Abs 01/25/2015 2.2  1.0 - 3.6 K/uL Final  . Monocytes Relative 01/25/2015 6   Final  . Monocytes Absolute 01/25/2015 0.6  0.2 - 1.0 K/uL Final  . Eosinophils Relative 01/25/2015 1   Final  . Eosinophils Absolute 01/25/2015 0.1  0 - 0.7 K/uL Final  . Basophils Relative 01/25/2015 1   Final  . Basophils Absolute 01/25/2015 0.0  0 - 0.1 K/uL Final  . Sodium 01/25/2015 135  135 - 145 mmol/L Final  . Potassium 01/25/2015 3.9  3.5 - 5.1 mmol/L Final  . Chloride 01/25/2015 102  101 - 111 mmol/L Final  . CO2 01/25/2015 22  22 - 32 mmol/L Final  . Glucose, Bld 01/25/2015 167* 65 - 99 mg/dL Final  . BUN 01/25/2015 21* 6 - 20 mg/dL Final  .  Creatinine, Ser 01/25/2015 1.18  0.61 - 1.24 mg/dL Final  . Calcium 01/25/2015 9.3  8.9 - 10.3 mg/dL Final  . Total Protein 01/25/2015 7.3  6.5 - 8.1 g/dL Final  . Albumin 01/25/2015 3.8  3.5 - 5.0 g/dL Final  . AST 01/25/2015 24  15 - 41 U/L Final  . ALT 01/25/2015 25  17 - 63 U/L Final  . Alkaline Phosphatase 01/25/2015 71  38 - 126 U/L Final  . Total Bilirubin 01/25/2015 0.4  0.3 - 1.2 mg/dL Final  . GFR calc non Af Amer 01/25/2015 57* >60 mL/min Final  . GFR calc Af Amer 01/25/2015 >60  >60 mL/min Final   Comment: (NOTE) The eGFR has been calculated using the CKD EPI equation. This calculation has not been validated in all clinical situations. eGFR's persistently <60 mL/min signify possible Chronic Kidney Disease.   . Anion gap 01/25/2015 11  5 - 15 Final     ASSESSMENT: Carcinoma prostate retroperitoneal adenopathy by PSA criteria patient is responding to the treatment MEDICAL DECISION MAKING:   PSA 0.19  All of the lab data has been reviewed and they are within normal limit  Continue Lupron 22.5 mg.  Reevaluate patient in 3 months   Patient expressed understanding and was in agreement with this plan. He also understands that He can call clinic at any time with any questions,  concerns, or complaints.    No matching staging information was found for the patient.  Forest Gleason, MD   01/25/2015 3:22 PM

## 2015-01-25 NOTE — Progress Notes (Signed)
Patient states he has reflux but also states he drinks 12 cups of coffee a day.  Also states that he becomes SOB when his reflux is bad.

## 2015-01-26 ENCOUNTER — Encounter: Payer: Self-pay | Admitting: Oncology

## 2015-04-26 ENCOUNTER — Inpatient Hospital Stay (HOSPITAL_BASED_OUTPATIENT_CLINIC_OR_DEPARTMENT_OTHER): Payer: Medicare Other | Admitting: Family Medicine

## 2015-04-26 ENCOUNTER — Inpatient Hospital Stay: Payer: Medicare Other

## 2015-04-26 ENCOUNTER — Inpatient Hospital Stay: Payer: Medicare Other | Attending: Oncology

## 2015-04-26 DIAGNOSIS — Z7982 Long term (current) use of aspirin: Secondary | ICD-10-CM | POA: Diagnosis not present

## 2015-04-26 DIAGNOSIS — M2550 Pain in unspecified joint: Secondary | ICD-10-CM | POA: Insufficient documentation

## 2015-04-26 DIAGNOSIS — E119 Type 2 diabetes mellitus without complications: Secondary | ICD-10-CM | POA: Diagnosis not present

## 2015-04-26 DIAGNOSIS — I10 Essential (primary) hypertension: Secondary | ICD-10-CM | POA: Insufficient documentation

## 2015-04-26 DIAGNOSIS — Z79899 Other long term (current) drug therapy: Secondary | ICD-10-CM

## 2015-04-26 DIAGNOSIS — K769 Liver disease, unspecified: Secondary | ICD-10-CM

## 2015-04-26 DIAGNOSIS — K219 Gastro-esophageal reflux disease without esophagitis: Secondary | ICD-10-CM

## 2015-04-26 DIAGNOSIS — Z7984 Long term (current) use of oral hypoglycemic drugs: Secondary | ICD-10-CM | POA: Insufficient documentation

## 2015-04-26 DIAGNOSIS — Z79818 Long term (current) use of other agents affecting estrogen receptors and estrogen levels: Secondary | ICD-10-CM | POA: Insufficient documentation

## 2015-04-26 DIAGNOSIS — R590 Localized enlarged lymph nodes: Secondary | ICD-10-CM | POA: Diagnosis not present

## 2015-04-26 DIAGNOSIS — F17210 Nicotine dependence, cigarettes, uncomplicated: Secondary | ICD-10-CM

## 2015-04-26 DIAGNOSIS — M899 Disorder of bone, unspecified: Secondary | ICD-10-CM | POA: Insufficient documentation

## 2015-04-26 DIAGNOSIS — C61 Malignant neoplasm of prostate: Secondary | ICD-10-CM

## 2015-04-26 LAB — CBC WITH DIFFERENTIAL/PLATELET
BASOS ABS: 0.1 10*3/uL (ref 0–0.1)
Basophils Relative: 1 %
Eosinophils Absolute: 0.1 10*3/uL (ref 0–0.7)
Eosinophils Relative: 1 %
HEMATOCRIT: 37.6 % — AB (ref 40.0–52.0)
HEMOGLOBIN: 12.6 g/dL — AB (ref 13.0–18.0)
LYMPHS ABS: 2 10*3/uL (ref 1.0–3.6)
LYMPHS PCT: 21 %
MCH: 29.9 pg (ref 26.0–34.0)
MCHC: 33.6 g/dL (ref 32.0–36.0)
MCV: 89.1 fL (ref 80.0–100.0)
Monocytes Absolute: 0.6 10*3/uL (ref 0.2–1.0)
Monocytes Relative: 6 %
NEUTROS ABS: 6.7 10*3/uL — AB (ref 1.4–6.5)
Neutrophils Relative %: 71 %
Platelets: 243 10*3/uL (ref 150–440)
RBC: 4.22 MIL/uL — AB (ref 4.40–5.90)
RDW: 14.1 % (ref 11.5–14.5)
WBC: 9.4 10*3/uL (ref 3.8–10.6)

## 2015-04-26 LAB — COMPREHENSIVE METABOLIC PANEL
ALK PHOS: 69 U/L (ref 38–126)
ALT: 30 U/L (ref 17–63)
AST: 31 U/L (ref 15–41)
Albumin: 4.2 g/dL (ref 3.5–5.0)
Anion gap: 8 (ref 5–15)
BILIRUBIN TOTAL: 0.4 mg/dL (ref 0.3–1.2)
BUN: 25 mg/dL — AB (ref 6–20)
CALCIUM: 9.3 mg/dL (ref 8.9–10.3)
CHLORIDE: 106 mmol/L (ref 101–111)
CO2: 23 mmol/L (ref 22–32)
CREATININE: 1.29 mg/dL — AB (ref 0.61–1.24)
GFR calc Af Amer: 59 mL/min — ABNORMAL LOW (ref >60)
GFR, EST NON AFRICAN AMERICAN: 51 mL/min — AB (ref >60)
Glucose, Bld: 145 mg/dL — ABNORMAL HIGH (ref 65–99)
Potassium: 5.2 mmol/L — ABNORMAL HIGH (ref 3.5–5.1)
Sodium: 137 mmol/L (ref 135–145)
TOTAL PROTEIN: 7.5 g/dL (ref 6.5–8.1)

## 2015-04-26 MED ORDER — LEUPROLIDE ACETATE (3 MONTH) 22.5 MG IM KIT
22.5000 mg | PACK | Freq: Once | INTRAMUSCULAR | Status: AC
  Administered 2015-04-26: 22.5 mg via INTRAMUSCULAR
  Filled 2015-04-26: qty 22.5

## 2015-04-26 MED ORDER — OMEPRAZOLE 20 MG PO CPDR: 20.0000 mg | DELAYED_RELEASE_CAPSULE | Freq: Every day | ORAL | Status: DC

## 2015-04-26 NOTE — Progress Notes (Signed)
[place]  Telephone:(336) [phone]  Fax:(336) [place] DOB: 1934-01-08  MR#: [id number]  CHY#:[id number]  Patient Care Team: Pcp Not In System as PCP - General   Patient seen on October 18, 2014.  CHIEF COMPLAINT:  Chief Complaint  Patient presents with  . Prostate Cancer   Lupron therapy for prostate cancer.   INTERVAL HISTORY:  Patient is here for continued follow-up and treatment consideration regarding prostate cancer. Patient is for Lupron 22.5 mg today which he receives every 3 months. He states overall feeling very well. He denies any hematuria, difficulty with urination, or pain with urination. He reports having improvement in acid reflux with initiation of omeprazole.   REVIEW OF SYSTEMS:   Review of Systems  Constitutional: Negative for fever, chills, weight loss, malaise/fatigue and diaphoresis.       Hot flashes  HENT: Negative for congestion, ear discharge, ear pain, hearing loss, nosebleeds, sore throat and tinnitus.   Eyes: Negative for blurred vision, double vision, photophobia, pain, discharge and redness.  Respiratory: Negative for cough, hemoptysis, sputum production, shortness of breath, wheezing and stridor.   Cardiovascular: Negative for chest pain, palpitations, orthopnea, claudication, leg swelling and PND.  Gastrointestinal: Negative for heartburn, nausea, vomiting, abdominal pain, diarrhea, constipation, blood in stool and melena.  Genitourinary: Negative.   Musculoskeletal: Positive for joint pain.  Skin: Negative.   Neurological: Negative for dizziness, tingling, focal weakness, seizures, weakness and headaches.  Endo/Heme/Allergies: Does not bruise/bleed easily.  Psychiatric/Behavioral: Negative for depression. The patient is not nervous/anxious and does not have insomnia.     As per HPI. Otherwise, a complete review of systems is negatve.  ONCOLOGY HISTORY: Oncology History   1. prostate cancer, extra  capsule extention, also illiac and retroperitoneal adenopathy on ct, small lesion liver possible metastatic, minimal uptake one area t spine. Asymptomatic. Not a surgical candidate with CT findings. Considdered not bulk and not visceral disease. Psa 13.6 prior to tx...casodex , then lupron first dose 12/31     Cancer of prostate [facility])    PAST MEDICAL HISTORY: Past Medical History  Diagnosis Date  . Cancer of prostate ([place]) 07/13/2014  . Diabetes mellitus without complication [facility])     Patient takes Metformin  . Hypertension     PAST SURGICAL HISTORY: No past surgical history on file.  FAMILY HISTORY No family history on file.  GYNECOLOGIC HISTORY:  No LMP for male patient.     ADVANCED DIRECTIVES:    HEALTH MAINTENANCE: Social History  Substance Use Topics  . Smoking status: Current Every Day Smoker  . Smokeless tobacco: Not on file  . Alcohol Use: Not on file     No Known Allergies  Current Outpatient Prescriptions  Medication Sig Dispense Refill  . aspirin 325 MG EC tablet Take 325 mg by mouth daily.    . fluvastatin (LESCOL) 20 MG capsule Take 20 mg by mouth at bedtime.    Marland Kitchen lisinopril-hydrochlorothiazide (PRINZIDE,ZESTORETIC) 10-12.5 MG tablet Take 1 tablet by mouth daily.    . metFORMIN (GLUCOPHAGE) 500 MG tablet Take 500 mg by mouth 2 (two) times daily with a meal.     . niacin (NIASPAN) 500 MG CR tablet Take 500 mg by mouth at bedtime.    . Omega-3 Fatty Acids (FISH OIL) 1000 MG CAPS Take by mouth 4 (four) times daily.    Marland Kitchen omeprazole (PRILOSEC) 20 MG capsule Take 1 capsule (20 mg total) by mouth daily. 30 capsule 3  .  tamsulosin (FLOMAX) 0.4 MG CAPS capsule Take 0.4 mg by mouth 2 (two) times daily.     No current facility-administered medications for this visit.    OBJECTIVE: There were no vitals taken for this visit.   There is no height or weight on file to calculate BMI.    ECOG FS:1 - Symptomatic but completely ambulatory  General: Well-developed,  well-nourished, no acute distress. Eyes: Pink conjunctiva, anicteric sclera. HEENT: Normocephalic, moist mucous membranes, clear oropharnyx. Lungs: Clear to auscultation bilaterally. Heart: Regular rate and rhythm. No rubs, murmurs, or gallops. Musculoskeletal: No edema, cyanosis, or clubbing. Occasional knee and joint pain. Neuro: Alert, answering all questions appropriately. Cranial nerves grossly intact. Skin: No rashes or petechiae noted. Psych: Normal affect.   LAB RESULTS:  Appointment on 04/26/2015  Component Date Value Ref Range Status  . WBC 04/26/2015 9.4  3.8 - 10.6 K/uL Final  . RBC 04/26/2015 4.22* 4.40 - 5.90 MIL/uL Final  . Hemoglobin 04/26/2015 12.6* 13.0 - 18.0 g/dL Final  . HCT 04/26/2015 37.6* 40.0 - 52.0 % Final  . MCV 04/26/2015 89.1  80.0 - 100.0 fL Final  . MCH 04/26/2015 29.9  26.0 - 34.0 pg Final  . MCHC 04/26/2015 33.6  32.0 - 36.0 g/dL Final  . RDW 04/26/2015 14.1  11.5 - 14.5 % Final  . Platelets 04/26/2015 243  150 - 440 K/uL Final  . Neutrophils Relative % 04/26/2015 71   Final  . Neutro Abs 04/26/2015 6.7* 1.4 - 6.5 K/uL Final  . Lymphocytes Relative 04/26/2015 21   Final  . Lymphs Abs 04/26/2015 2.0  1.0 - 3.6 K/uL Final  . Monocytes Relative 04/26/2015 6   Final  . Monocytes Absolute 04/26/2015 0.6  0.2 - 1.0 K/uL Final  . Eosinophils Relative 04/26/2015 1   Final  . Eosinophils Absolute 04/26/2015 0.1  0 - 0.7 K/uL Final  . Basophils Relative 04/26/2015 1   Final  . Basophils Absolute 04/26/2015 0.1  0 - 0.1 K/uL Final  . Sodium 04/26/2015 137  135 - 145 mmol/L Final  . Potassium 04/26/2015 5.2* 3.5 - 5.1 mmol/L Final  . Chloride 04/26/2015 106  101 - 111 mmol/L Final  . CO2 04/26/2015 23  22 - 32 mmol/L Final  . Glucose, Bld 04/26/2015 145* 65 - 99 mg/dL Final  . BUN 04/26/2015 25* 6 - 20 mg/dL Final  . Creatinine, Ser 04/26/2015 1.29* 0.61 - 1.24 mg/dL Final  . Calcium 04/26/2015 9.3  8.9 - 10.3 mg/dL Final  . Total Protein 04/26/2015 7.5   6.5 - 8.1 g/dL Final  . Albumin 04/26/2015 4.2  3.5 - 5.0 g/dL Final  . AST 04/26/2015 31  15 - 41 U/L Final  . ALT 04/26/2015 30  17 - 63 U/L Final  . Alkaline Phosphatase 04/26/2015 69  38 - 126 U/L Final  . Total Bilirubin 04/26/2015 0.4  0.3 - 1.2 mg/dL Final  . GFR calc non Af Amer 04/26/2015 51* >60 mL/min Final  . GFR calc Af Amer 04/26/2015 59* >60 mL/min Final   Comment: (NOTE) The eGFR has been calculated using the CKD EPI equation. This calculation has not been validated in all clinical situations. eGFR's persistently <60 mL/min signify possible Chronic Kidney Disease.   . Anion gap 04/26/2015 8  5 - 15 Final    STUDIES: No results found.  ASSESSMENT:  Prostate cancer, with retroperitoneal adenopathy.  PLAN:   1. Prostate CA. We'll proceed with Lupron 22.5 mg every 3 months. Most recent PSA  was In January 2017 and reported as 0.19. PSA from today is pending, will call patient with results on Monday.  We will continue with follow-up in 3 months with Dr. Grayland Ormond at his request.  Patient expressed understanding and was in agreement with this plan. He also understands that He can call clinic at any time with any questions, concerns, or complaints.   Dr. Oliva Bustard was available for consultation and review of plan of care for this patient.  Evlyn Kanner, NP   04/26/2015 3:45 PM

## 2015-04-27 LAB — PSA: PSA: 0.48 ng/mL (ref 0.00–4.00)

## 2015-05-01 ENCOUNTER — Other Ambulatory Visit: Payer: Self-pay | Admitting: Family Medicine

## 2015-06-29 ENCOUNTER — Other Ambulatory Visit: Payer: Self-pay | Admitting: Nurse Practitioner

## 2015-07-26 ENCOUNTER — Ambulatory Visit: Payer: Medicare Other

## 2015-07-26 ENCOUNTER — Ambulatory Visit: Payer: Medicare Other | Admitting: Oncology

## 2015-07-26 ENCOUNTER — Other Ambulatory Visit: Payer: Medicare Other

## 2015-08-02 ENCOUNTER — Inpatient Hospital Stay: Payer: Medicare Other

## 2015-08-02 ENCOUNTER — Inpatient Hospital Stay: Payer: Medicare Other | Attending: Oncology | Admitting: Oncology

## 2015-08-02 VITALS — BP 149/74 | HR 80 | Temp 97.4°F | Wt 221.8 lb

## 2015-08-02 DIAGNOSIS — I10 Essential (primary) hypertension: Secondary | ICD-10-CM | POA: Insufficient documentation

## 2015-08-02 DIAGNOSIS — Z7984 Long term (current) use of oral hypoglycemic drugs: Secondary | ICD-10-CM | POA: Insufficient documentation

## 2015-08-02 DIAGNOSIS — Z7982 Long term (current) use of aspirin: Secondary | ICD-10-CM | POA: Insufficient documentation

## 2015-08-02 DIAGNOSIS — R599 Enlarged lymph nodes, unspecified: Secondary | ICD-10-CM | POA: Diagnosis not present

## 2015-08-02 DIAGNOSIS — K769 Liver disease, unspecified: Secondary | ICD-10-CM | POA: Insufficient documentation

## 2015-08-02 DIAGNOSIS — Z79899 Other long term (current) drug therapy: Secondary | ICD-10-CM | POA: Diagnosis not present

## 2015-08-02 DIAGNOSIS — E119 Type 2 diabetes mellitus without complications: Secondary | ICD-10-CM | POA: Diagnosis not present

## 2015-08-02 DIAGNOSIS — M899 Disorder of bone, unspecified: Secondary | ICD-10-CM | POA: Diagnosis not present

## 2015-08-02 DIAGNOSIS — C61 Malignant neoplasm of prostate: Secondary | ICD-10-CM

## 2015-08-02 DIAGNOSIS — R232 Flushing: Secondary | ICD-10-CM | POA: Diagnosis not present

## 2015-08-02 LAB — COMPREHENSIVE METABOLIC PANEL
ALK PHOS: 64 U/L (ref 38–126)
ALT: 27 U/L (ref 17–63)
AST: 27 U/L (ref 15–41)
Albumin: 4 g/dL (ref 3.5–5.0)
Anion gap: 6 (ref 5–15)
BUN: 29 mg/dL — AB (ref 6–20)
CALCIUM: 9.4 mg/dL (ref 8.9–10.3)
CHLORIDE: 106 mmol/L (ref 101–111)
CO2: 24 mmol/L (ref 22–32)
CREATININE: 1.26 mg/dL — AB (ref 0.61–1.24)
GFR calc Af Amer: >60 mL/min (ref >60)
GFR, EST NON AFRICAN AMERICAN: 52 mL/min — AB (ref >60)
Glucose, Bld: 114 mg/dL — ABNORMAL HIGH (ref 65–99)
Potassium: 5.4 mmol/L — ABNORMAL HIGH (ref 3.5–5.1)
Sodium: 136 mmol/L (ref 135–145)
Total Bilirubin: 0.5 mg/dL (ref 0.3–1.2)
Total Protein: 7.5 g/dL (ref 6.5–8.1)

## 2015-08-02 LAB — CBC WITH DIFFERENTIAL/PLATELET
BASOS ABS: 0.1 10*3/uL (ref 0–0.1)
Basophils Relative: 1 %
EOS PCT: 2 %
Eosinophils Absolute: 0.1 10*3/uL (ref 0–0.7)
HCT: 36.4 % — ABNORMAL LOW (ref 40.0–52.0)
HEMOGLOBIN: 12.5 g/dL — AB (ref 13.0–18.0)
LYMPHS ABS: 2.5 10*3/uL (ref 1.0–3.6)
LYMPHS PCT: 30 %
MCH: 30.6 pg (ref 26.0–34.0)
MCHC: 34.3 g/dL (ref 32.0–36.0)
MCV: 89.3 fL (ref 80.0–100.0)
Monocytes Absolute: 0.5 10*3/uL (ref 0.2–1.0)
Monocytes Relative: 6 %
NEUTROS ABS: 5.1 10*3/uL (ref 1.4–6.5)
NEUTROS PCT: 61 %
PLATELETS: 224 10*3/uL (ref 150–440)
RBC: 4.08 MIL/uL — AB (ref 4.40–5.90)
RDW: 13.7 % (ref 11.5–14.5)
WBC: 8.2 10*3/uL (ref 3.8–10.6)

## 2015-08-02 LAB — PSA: PSA: 2.92 ng/mL (ref 0.00–4.00)

## 2015-08-02 MED ORDER — LEUPROLIDE ACETATE (4 MONTH) 30 MG IM KIT
30.0000 mg | PACK | Freq: Once | INTRAMUSCULAR | Status: AC
  Administered 2015-08-02: 30 mg via INTRAMUSCULAR
  Filled 2015-08-02: qty 30

## 2015-08-02 NOTE — Progress Notes (Signed)
[place]  [phone]) [phone]  Fax:(336) [place] DOB: [birth year]  MR#: [id number]  FXT#:[id number]  Patient Care Team: Pcp Not In System as PCP - General   Patient seen on October 18, 2014.  CHIEF COMPLAINT: Prostate cancer with retroperitoneal adenopathy.  INTERVAL HISTORY:  Patient returns to clinic today for routine follow-up and continuation of Lupron. He continues to feel well and remains asymptomatic. He does admit to hot flashes with his Lupron injections, but otherwise is tolerating them well. He has no neurologic complaints. He denies any recent fevers or illnesses. He has a good appetite and denies weight loss. He denies any chest pain or shortness of breath. He denies any nausea, vomiting, constipation, or diarrhea. He has no urinary complaints. Patient otherwise feels well and offers no further specific complaints.   REVIEW OF SYSTEMS:   Review of Systems  Constitutional: Positive for diaphoresis. Negative for fever, malaise/fatigue and weight loss.  Respiratory: Negative.  Negative for shortness of breath.   Cardiovascular: Negative.  Negative for chest pain and leg swelling.  Gastrointestinal: Negative.  Negative for abdominal pain.  Genitourinary: Negative.   Musculoskeletal: Negative.   Neurological: Positive for sensory change. Negative for weakness.  Psychiatric/Behavioral: Negative.     As per HPI. Otherwise, a complete review of systems is negatve.  ONCOLOGY HISTORY: Oncology History   1. prostate cancer, extra capsule extention, also illiac and retroperitoneal adenopathy on ct, small lesion liver possible metastatic, minimal uptake one area t spine. Asymptomatic. Not a surgical candidate with CT findings. Considdered not bulk and not visceral disease. Psa 13.6 prior to tx...casodex , then lupron first dose 12/31     Cancer of prostate [facility])    PAST MEDICAL HISTORY: Past Medical History:  Diagnosis Date  .  Cancer of prostate ([place]) 07/13/2014  . Diabetes mellitus without complication [facility])    Patient takes Metformin  . Hypertension     PAST SURGICAL HISTORY: No past surgical history on file.  FAMILY HISTORY: Reviewed and unchanged. No reported history of malignancy or chronic disease.  GYNECOLOGIC HISTORY:  No LMP for male patient.     ADVANCED DIRECTIVES:    HEALTH MAINTENANCE: Social History  Substance Use Topics  . Smoking status: Current Every Day Smoker  . Smokeless tobacco: Not on file  . Alcohol use Not on file     No Known Allergies  Current Outpatient Prescriptions  Medication Sig Dispense Refill  . aspirin 325 MG EC tablet Take 325 mg by mouth daily.    . fluvastatin (LESCOL) 20 MG capsule Take 20 mg by mouth at bedtime.    Marland Kitchen lisinopril-hydrochlorothiazide (PRINZIDE,ZESTORETIC) 10-12.5 MG tablet Take 1 tablet by mouth daily.    Marland Kitchen lisinopril-hydrochlorothiazide (PRINZIDE,ZESTORETIC) 20-12.5 MG tablet     . metFORMIN (GLUCOPHAGE) 500 MG tablet Take 500 mg by mouth 2 (two) times daily with a meal.     . niacin (NIASPAN) 500 MG CR tablet Take 500 mg by mouth at bedtime.    . Omega-3 Fatty Acids (FISH OIL) 1000 MG CAPS Take by mouth 4 (four) times daily.    Marland Kitchen omeprazole (PRILOSEC) 20 MG capsule Take 1 capsule (20 mg total) by mouth daily. 90 capsule 3  . tamsulosin (FLOMAX) 0.4 MG CAPS capsule Take 0.4 mg by mouth 2 (two) times daily.     No current facility-administered medications for this visit.     OBJECTIVE: BP (!) 149/74 (BP Location: Right Arm, Patient Position: Sitting)  Pulse 80   Temp 97.4 F (36.3 C) (Tympanic)   Wt 221 lb 12.5 oz (100.6 kg)    There is no height or weight on file to calculate BMI.    ECOG FS:1 - Symptomatic but completely ambulatory  General: Well-developed, well-nourished, no acute distress. Eyes: Pink conjunctiva, anicteric sclera. HEENT: Normocephalic, moist mucous membranes, clear oropharnyx. Lungs: Clear to auscultation  bilaterally. Heart: Regular rate and rhythm. No rubs, murmurs, or gallops. Musculoskeletal: No edema, cyanosis, or clubbing. Occasional knee and joint pain. Neuro: Alert, answering all questions appropriately. Cranial nerves grossly intact. Skin: No rashes or petechiae noted. Psych: Normal affect.   LAB RESULTS:  Appointment on 08/02/2015  Component Date Value Ref Range Status  . WBC 08/02/2015 8.2  3.8 - 10.6 K/uL Final  . RBC 08/02/2015 4.08* 4.40 - 5.90 MIL/uL Final  . Hemoglobin 08/02/2015 12.5* 13.0 - 18.0 g/dL Final  . HCT 08/02/2015 36.4* 40.0 - 52.0 % Final  . MCV 08/02/2015 89.3  80.0 - 100.0 fL Final  . MCH 08/02/2015 30.6  26.0 - 34.0 pg Final  . MCHC 08/02/2015 34.3  32.0 - 36.0 g/dL Final  . RDW 08/02/2015 13.7  11.5 - 14.5 % Final  . Platelets 08/02/2015 224  150 - 440 K/uL Final  . Neutrophils Relative % 08/02/2015 61  % Final  . Neutro Abs 08/02/2015 5.1  1.4 - 6.5 K/uL Final  . Lymphocytes Relative 08/02/2015 30  % Final  . Lymphs Abs 08/02/2015 2.5  1.0 - 3.6 K/uL Final  . Monocytes Relative 08/02/2015 6  % Final  . Monocytes Absolute 08/02/2015 0.5  0.2 - 1.0 K/uL Final  . Eosinophils Relative 08/02/2015 2  % Final  . Eosinophils Absolute 08/02/2015 0.1  0 - 0.7 K/uL Final  . Basophils Relative 08/02/2015 1  % Final  . Basophils Absolute 08/02/2015 0.1  0 - 0.1 K/uL Final  . Sodium 08/02/2015 136  135 - 145 mmol/L Final  . Potassium 08/02/2015 5.4* 3.5 - 5.1 mmol/L Final  . Chloride 08/02/2015 106  101 - 111 mmol/L Final  . CO2 08/02/2015 24  22 - 32 mmol/L Final  . Glucose, Bld 08/02/2015 114* 65 - 99 mg/dL Final  . BUN 08/02/2015 29* 6 - 20 mg/dL Final  . Creatinine, Ser 08/02/2015 1.26* 0.61 - 1.24 mg/dL Final  . Calcium 08/02/2015 9.4  8.9 - 10.3 mg/dL Final  . Total Protein 08/02/2015 7.5  6.5 - 8.1 g/dL Final  . Albumin 08/02/2015 4.0  3.5 - 5.0 g/dL Final  . AST 08/02/2015 27  15 - 41 U/L Final  . ALT 08/02/2015 27  17 - 63 U/L Final  . Alkaline  Phosphatase 08/02/2015 64  38 - 126 U/L Final  . Total Bilirubin 08/02/2015 0.5  0.3 - 1.2 mg/dL Final  . GFR calc non Af Amer 08/02/2015 52* >60 mL/min Final  . GFR calc Af Amer 08/02/2015 >60  >60 mL/min Final   Comment: (NOTE) The eGFR has been calculated using the CKD EPI equation. This calculation has not been validated in all clinical situations. eGFR's persistently <60 mL/min signify possible Chronic Kidney Disease.   . Anion gap 08/02/2015 6  5 - 15 Final   Lab Results  Component Value Date   PSA 2.92 08/02/2015    STUDIES: No results found.  ASSESSMENT:  Prostate cancer with retroperitoneal adenopathy.  PLAN:     1. Prostate cancer with retroperitoneal adenopathy: Although patient's PSA is trending up, will proceed with increased  dose of 30 mg Lupron today. Patient likely has progression of disease. Will call patient with his PSA results and also inquired he wishes to proceed with more aggressive treatment using Xtandi or Zytiga. For now, patient has follow-up in 4 months to receive his next injection of Lupron. At that point, he may be switched to 45 mg Lupron for injections every 6 months.  Approximately 30 minutes was spent discussion of which greater than 50% was consultation.  Patient expressed understanding and was in agreement with this plan. He also understands that He can call clinic at any time with any questions, concerns, or complaints.    Lloyd Huger, MD   04/26/2015 2:22 PM

## 2015-08-28 NOTE — Progress Notes (Signed)
[place]  Telephone:(336) [phone]  Fax:(336) [place] DOB: 28-Nov-1933  MR#: [id number]  ZYY#:[id number]  Patient Care Team: Pcp Not In System as PCP - General   Patient seen on October 18, 2014.  CHIEF COMPLAINT: Prostate cancer with retroperitoneal adenopathy.  INTERVAL HISTORY:  Patient returns to clinic today for evaluation and treatment planning for his rising PSA. He continues to feel well and remains asymptomatic. He does admit to hot flashes with his Lupron injections, but otherwise is tolerating them well. He has no neurologic complaints. He denies any recent fevers or illnesses. He has a good appetite and denies weight loss. He denies any chest pain or shortness of breath. He denies any nausea, vomiting, constipation, or diarrhea. He has no urinary complaints. Patient otherwise feels well and offers no further specific complaints.   REVIEW OF SYSTEMS:   Review of Systems  Constitutional: Positive for diaphoresis. Negative for fever, malaise/fatigue and weight loss.  Respiratory: Negative.  Negative for shortness of breath.   Cardiovascular: Negative.  Negative for chest pain and leg swelling.  Gastrointestinal: Negative.  Negative for abdominal pain.  Genitourinary: Negative.   Musculoskeletal: Negative.   Neurological: Positive for sensory change. Negative for weakness.  Psychiatric/Behavioral: Negative.     As per HPI. Otherwise, a complete review of systems is negatve.  ONCOLOGY HISTORY: Oncology History   1. prostate cancer, extra capsule extention, also illiac and retroperitoneal adenopathy on ct, small lesion liver possible metastatic, minimal uptake one area t spine. Asymptomatic. Not a surgical candidate with CT findings. Considdered not bulk and not visceral disease. Psa 13.6 prior to tx...casodex , then lupron first dose 12/31     Primary prostate cancer with metastasis from prostate to other site [facility])    PAST  MEDICAL HISTORY: Past Medical History:  Diagnosis Date  . Cancer of prostate ([place]) 07/13/2014  . Diabetes mellitus without complication [facility])    Patient takes Metformin  . Hypertension     PAST SURGICAL HISTORY: No past surgical history on file.  FAMILY HISTORY: Reviewed and unchanged. No reported history of malignancy or chronic disease.  GYNECOLOGIC HISTORY:  No LMP for male patient.     ADVANCED DIRECTIVES:    HEALTH MAINTENANCE: Social History  Substance Use Topics  . Smoking status: Current Every Day Smoker  . Smokeless tobacco: Not on file  . Alcohol use Not on file     No Known Allergies  Current Outpatient Prescriptions  Medication Sig Dispense Refill  . aspirin 325 MG EC tablet Take 325 mg by mouth daily.    . fluvastatin (LESCOL) 20 MG capsule Take 20 mg by mouth at bedtime.    Marland Kitchen lisinopril-hydrochlorothiazide (PRINZIDE,ZESTORETIC) 20-12.5 MG tablet     . metFORMIN (GLUCOPHAGE) 500 MG tablet Take 500 mg by mouth 2 (two) times daily with a meal.     . niacin (NIASPAN) 500 MG CR tablet Take 500 mg by mouth at bedtime.    . Omega-3 Fatty Acids (FISH OIL) 1000 MG CAPS Take by mouth 4 (four) times daily.    Marland Kitchen omeprazole (PRILOSEC) 20 MG capsule Take 1 capsule (20 mg total) by mouth daily. 90 capsule 3  . tamsulosin (FLOMAX) 0.4 MG CAPS capsule Take 0.4 mg by mouth 2 (two) times daily.    . enzalutamide (XTANDI) 40 MG capsule Take 4 capsules (160 mg total) by mouth daily. 120 capsule 5  . lisinopril-hydrochlorothiazide (PRINZIDE,ZESTORETIC) 10-12.5 MG tablet Take 1 tablet by  mouth daily.     No current facility-administered medications for this visit.     OBJECTIVE: BP 118/80 (BP Location: Right Arm, Patient Position: Sitting)   Pulse 83   Temp 97.5 F (36.4 C) (Oral)   Resp 18   Wt 221 lb 14.3 oz (100.7 kg)    There is no height or weight on file to calculate BMI.    ECOG FS:1 - Symptomatic but completely ambulatory  General: Well-developed, well-nourished,  no acute distress. Eyes: Pink conjunctiva, anicteric sclera. HEENT: Normocephalic, moist mucous membranes, clear oropharnyx. Lungs: Clear to auscultation bilaterally. Heart: Regular rate and rhythm. No rubs, murmurs, or gallops. Musculoskeletal: No edema, cyanosis, or clubbing. Occasional knee and joint pain. Neuro: Alert, answering all questions appropriately. Cranial nerves grossly intact. Skin: No rashes or petechiae noted. Psych: Normal affect.   LAB RESULTS:  No visits with results within 3 Day(s) from this visit.  Latest known visit with results is:  Appointment on 08/02/2015  Component Date Value Ref Range Status  . WBC 08/02/2015 8.2  3.8 - 10.6 K/uL Final  . RBC 08/02/2015 4.08* 4.40 - 5.90 MIL/uL Final  . Hemoglobin 08/02/2015 12.5* 13.0 - 18.0 g/dL Final  . HCT 08/02/2015 36.4* 40.0 - 52.0 % Final  . MCV 08/02/2015 89.3  80.0 - 100.0 fL Final  . MCH 08/02/2015 30.6  26.0 - 34.0 pg Final  . MCHC 08/02/2015 34.3  32.0 - 36.0 g/dL Final  . RDW 08/02/2015 13.7  11.5 - 14.5 % Final  . Platelets 08/02/2015 224  150 - 440 K/uL Final  . Neutrophils Relative % 08/02/2015 61  % Final  . Neutro Abs 08/02/2015 5.1  1.4 - 6.5 K/uL Final  . Lymphocytes Relative 08/02/2015 30  % Final  . Lymphs Abs 08/02/2015 2.5  1.0 - 3.6 K/uL Final  . Monocytes Relative 08/02/2015 6  % Final  . Monocytes Absolute 08/02/2015 0.5  0.2 - 1.0 K/uL Final  . Eosinophils Relative 08/02/2015 2  % Final  . Eosinophils Absolute 08/02/2015 0.1  0 - 0.7 K/uL Final  . Basophils Relative 08/02/2015 1  % Final  . Basophils Absolute 08/02/2015 0.1  0 - 0.1 K/uL Final  . Sodium 08/02/2015 136  135 - 145 mmol/L Final  . Potassium 08/02/2015 5.4* 3.5 - 5.1 mmol/L Final  . Chloride 08/02/2015 106  101 - 111 mmol/L Final  . CO2 08/02/2015 24  22 - 32 mmol/L Final  . Glucose, Bld 08/02/2015 114* 65 - 99 mg/dL Final  . BUN 08/02/2015 29* 6 - 20 mg/dL Final  . Creatinine, Ser 08/02/2015 1.26* 0.61 - 1.24 mg/dL Final   . Calcium 08/02/2015 9.4  8.9 - 10.3 mg/dL Final  . Total Protein 08/02/2015 7.5  6.5 - 8.1 g/dL Final  . Albumin 08/02/2015 4.0  3.5 - 5.0 g/dL Final  . AST 08/02/2015 27  15 - 41 U/L Final  . ALT 08/02/2015 27  17 - 63 U/L Final  . Alkaline Phosphatase 08/02/2015 64  38 - 126 U/L Final  . Total Bilirubin 08/02/2015 0.5  0.3 - 1.2 mg/dL Final  . GFR calc non Af Amer 08/02/2015 52* >60 mL/min Final  . GFR calc Af Amer 08/02/2015 >60  >60 mL/min Final   Comment: (NOTE) The eGFR has been calculated using the CKD EPI equation. This calculation has not been validated in all clinical situations. eGFR's persistently <60 mL/min signify possible Chronic Kidney Disease.   . Anion gap 08/02/2015 6  5 - 15  Final  . PSA 08/02/2015 2.92  0.00 - 4.00 ng/mL Final   Comment: (NOTE) While PSA levels of <=4.0 ng/ml are reported as reference range, some men with levels below 4.0 ng/ml can have prostate cancer and many men with PSA above 4.0 ng/ml do not have prostate cancer.  Other tests such as free PSA, age specific reference ranges, PSA velocity and PSA doubling time may be helpful especially in men less than 30 years old. Performed at [facility]    Lab Results  Component Value Date   PSA 2.92 08/02/2015    STUDIES: No results found.  ASSESSMENT:  Prostate cancer with retroperitoneal adenopathy.  PLAN:     1. Prostate cancer with retroperitoneal adenopathy: Patient's PSA is now trending up. Given his retroperitoneal adenopathy patient likely has progression of disease. After lengthy discussion with the patient and his family, he has agreed to initiate 160 mg Xtandi daily. Patient last received Lupron in July 2017. Return to clinic in 6 weeks which will be approximately 4 weeks after initiating treatment for laboratory work and to assess his toleration of Xtandi.  Approximately 30 minutes was spent discussion of which greater than 50% was consultation.  Patient expressed  understanding and was in agreement with this plan. He also understands that He can call clinic at any time with any questions, concerns, or complaints.    Lloyd Huger, MD   04/26/2015 8:23 AM

## 2015-08-29 ENCOUNTER — Inpatient Hospital Stay: Payer: Medicare Other | Attending: Oncology | Admitting: Oncology

## 2015-08-29 VITALS — BP 118/80 | HR 83 | Temp 97.5°F | Resp 18 | Wt 221.9 lb

## 2015-08-29 DIAGNOSIS — R61 Generalized hyperhidrosis: Secondary | ICD-10-CM | POA: Insufficient documentation

## 2015-08-29 DIAGNOSIS — F17210 Nicotine dependence, cigarettes, uncomplicated: Secondary | ICD-10-CM | POA: Insufficient documentation

## 2015-08-29 DIAGNOSIS — Z7982 Long term (current) use of aspirin: Secondary | ICD-10-CM | POA: Insufficient documentation

## 2015-08-29 DIAGNOSIS — E119 Type 2 diabetes mellitus without complications: Secondary | ICD-10-CM | POA: Insufficient documentation

## 2015-08-29 DIAGNOSIS — I10 Essential (primary) hypertension: Secondary | ICD-10-CM | POA: Insufficient documentation

## 2015-08-29 DIAGNOSIS — R9721 Rising PSA following treatment for malignant neoplasm of prostate: Secondary | ICD-10-CM | POA: Insufficient documentation

## 2015-08-29 DIAGNOSIS — Z79899 Other long term (current) drug therapy: Secondary | ICD-10-CM

## 2015-08-29 DIAGNOSIS — C61 Malignant neoplasm of prostate: Secondary | ICD-10-CM | POA: Diagnosis not present

## 2015-08-29 DIAGNOSIS — R599 Enlarged lymph nodes, unspecified: Secondary | ICD-10-CM | POA: Insufficient documentation

## 2015-08-29 DIAGNOSIS — K769 Liver disease, unspecified: Secondary | ICD-10-CM | POA: Insufficient documentation

## 2015-08-29 DIAGNOSIS — M899 Disorder of bone, unspecified: Secondary | ICD-10-CM | POA: Insufficient documentation

## 2015-08-29 DIAGNOSIS — Z7984 Long term (current) use of oral hypoglycemic drugs: Secondary | ICD-10-CM | POA: Insufficient documentation

## 2015-08-31 MED ORDER — ENZALUTAMIDE 40 MG PO CAPS: 160.0000 mg | ORAL_CAPSULE | Freq: Every day | ORAL | 5 refills | Status: DC

## 2015-09-06 NOTE — Progress Notes (Signed)
Pt to start xtandi 40mg  tablets, take 4 tabs PO daily. Informed consent obtained. Pt educated on how to take medication and side effects were reviewed with patient and family. Prescription faxed to [facility]. Pt has follow up scheduled on 10/10/15. Pt and family verbalized understanding.

## 2015-09-18 ENCOUNTER — Other Ambulatory Visit: Payer: Self-pay | Admitting: *Deleted

## 2015-09-18 ENCOUNTER — Telehealth: Payer: Self-pay | Admitting: *Deleted

## 2015-09-18 NOTE — Telephone Encounter (Signed)
Patient notified our office that received xtandi medication and started taking on Thursday, Sept 7th. Pt states has difficult time taking all 4 capsules at one time and was instructed to take 2 caps in the morning and 2 in the evening. Pt verbalized understanding. Pt has appt scheduled for follow up on 10/10/15.

## 2015-10-09 NOTE — Progress Notes (Signed)
[place]  Telephone:(336) [phone]  Fax:(336) [place] DOB: 1933/01/25  MR#: [id number]  WOE#:[id number]  Patient Care Team: Pcp Not In System as PCP - General   Patient seen on October 18, 2014.  CHIEF COMPLAINT: Prostate cancer with retroperitoneal adenopathy.  INTERVAL HISTORY:  Patient returns to clinic today for evaluation and to assess his toleration of Xtandi. He has intermittent postural dizziness and occasional shortness of breath, but otherwise feels well. He has no neurologic complaints. He denies any recent fevers or illnesses. He has a good appetite and denies weight loss. He denies any chest pain. He denies any nausea, vomiting, constipation, or diarrhea. He has no urinary complaints. Patient offers no further specific complaints today.   REVIEW OF SYSTEMS:   Review of Systems  Constitutional: Negative for diaphoresis, fever, malaise/fatigue and weight loss.  Respiratory: Positive for shortness of breath. Negative for cough.   Cardiovascular: Negative.  Negative for chest pain and leg swelling.  Gastrointestinal: Negative.  Negative for abdominal pain.  Genitourinary: Negative.   Musculoskeletal: Negative.   Neurological: Positive for dizziness and sensory change. Negative for weakness.  Psychiatric/Behavioral: Negative.  The patient is not nervous/anxious.     As per HPI. Otherwise, a complete review of systems is negative.  ONCOLOGY HISTORY: Oncology History   1. prostate cancer, extra capsule extention, also illiac and retroperitoneal adenopathy on ct, small lesion liver possible metastatic, minimal uptake one area t spine. Asymptomatic. Not a surgical candidate with CT findings. Considdered not bulk and not visceral disease. Psa 13.6 prior to tx...casodex , then lupron first dose 12/31     Primary prostate cancer with metastasis from prostate to other site [facility])    PAST MEDICAL HISTORY: Past Medical History:    Diagnosis Date  . Cancer of prostate ([place]) 07/13/2014  . Diabetes mellitus without complication [facility])    Patient takes Metformin  . Hypertension     PAST SURGICAL HISTORY: No past surgical history on file.  FAMILY HISTORY: Reviewed and unchanged. No reported history of malignancy or chronic disease.  GYNECOLOGIC HISTORY:  No LMP for male patient.     ADVANCED DIRECTIVES:    HEALTH MAINTENANCE: Social History  Substance Use Topics  . Smoking status: Current Every Day Smoker  . Smokeless tobacco: Never Used  . Alcohol use 0.0 oz/week     No Known Allergies  Current Outpatient Prescriptions  Medication Sig Dispense Refill  . aspirin 325 MG EC tablet Take 325 mg by mouth daily.    . enzalutamide (XTANDI) 40 MG capsule Take 4 capsules (160 mg total) by mouth daily. 120 capsule 5  . fluvastatin (LESCOL) 20 MG capsule Take 20 mg by mouth at bedtime.    Marland Kitchen lisinopril-hydrochlorothiazide (PRINZIDE,ZESTORETIC) 20-12.5 MG tablet     . metFORMIN (GLUCOPHAGE) 500 MG tablet Take 500 mg by mouth 2 (two) times daily with a meal.     . niacin (NIASPAN) 500 MG CR tablet Take 500 mg by mouth at bedtime.    . Omega-3 Fatty Acids (FISH OIL) 1000 MG CAPS Take by mouth 4 (four) times daily.    Marland Kitchen omeprazole (PRILOSEC) 20 MG capsule Take 1 capsule (20 mg total) by mouth daily. 90 capsule 3  . tamsulosin (FLOMAX) 0.4 MG CAPS capsule Take 0.4 mg by mouth 2 (two) times daily.    Marland Kitchen lisinopril-hydrochlorothiazide (PRINZIDE,ZESTORETIC) 10-12.5 MG tablet Take 1 tablet by mouth daily.     No current facility-administered medications for  this visit.     OBJECTIVE: BP 136/78 (BP Location: Left Arm, Patient Position: Sitting)   Pulse 91   Temp 97.6 F (36.4 C) (Tympanic)   Wt 221 lb 0.2 oz (100.3 kg)    There is no height or weight on file to calculate BMI.    ECOG FS:1 - Symptomatic but completely ambulatory  General: Well-developed, well-nourished, no acute distress. Eyes: Pink conjunctiva,  anicteric sclera. HEENT: Normocephalic, moist mucous membranes, clear oropharnyx. Lungs: Clear to auscultation bilaterally. Heart: Regular rate and rhythm. No rubs, murmurs, or gallops. Musculoskeletal: No edema, cyanosis, or clubbing. Occasional knee and joint pain. Neuro: Alert, answering all questions appropriately. Cranial nerves grossly intact. Skin: No rashes or petechiae noted. Psych: Normal affect.   LAB RESULTS:  Appointment on 10/10/2015  Component Date Value Ref Range Status  . WBC 10/10/2015 8.1  3.8 - 10.6 K/uL Final  . RBC 10/10/2015 3.96* 4.40 - 5.90 MIL/uL Final  . Hemoglobin 10/10/2015 12.1* 13.0 - 18.0 g/dL Final  . HCT 10/10/2015 35.2* 40.0 - 52.0 % Final  . MCV 10/10/2015 88.8  80.0 - 100.0 fL Final  . MCH 10/10/2015 30.6  26.0 - 34.0 pg Final  . MCHC 10/10/2015 34.5  32.0 - 36.0 g/dL Final  . RDW 10/10/2015 13.8  11.5 - 14.5 % Final  . Platelets 10/10/2015 239  150 - 440 K/uL Final  . Neutrophils Relative % 10/10/2015 65  % Final  . Neutro Abs 10/10/2015 5.3  1.4 - 6.5 K/uL Final  . Lymphocytes Relative 10/10/2015 26  % Final  . Lymphs Abs 10/10/2015 2.1  1.0 - 3.6 K/uL Final  . Monocytes Relative 10/10/2015 6  % Final  . Monocytes Absolute 10/10/2015 0.5  0.2 - 1.0 K/uL Final  . Eosinophils Relative 10/10/2015 2  % Final  . Eosinophils Absolute 10/10/2015 0.2  0 - 0.7 K/uL Final  . Basophils Relative 10/10/2015 1  % Final  . Basophils Absolute 10/10/2015 0.1  0 - 0.1 K/uL Final  . Sodium 10/10/2015 135  135 - 145 mmol/L Final  . Potassium 10/10/2015 4.6  3.5 - 5.1 mmol/L Final  . Chloride 10/10/2015 103  101 - 111 mmol/L Final  . CO2 10/10/2015 23  22 - 32 mmol/L Final  . Glucose, Bld 10/10/2015 163* 65 - 99 mg/dL Final  . BUN 10/10/2015 28* 6 - 20 mg/dL Final  . Creatinine, Ser 10/10/2015 1.46* 0.61 - 1.24 mg/dL Final  . Calcium 10/10/2015 9.3  8.9 - 10.3 mg/dL Final  . Total Protein 10/10/2015 7.5  6.5 - 8.1 g/dL Final  . Albumin 10/10/2015 4.1  3.5 -  5.0 g/dL Final  . AST 10/10/2015 20  15 - 41 U/L Final  . ALT 10/10/2015 17  17 - 63 U/L Final  . Alkaline Phosphatase 10/10/2015 73  38 - 126 U/L Final  . Total Bilirubin 10/10/2015 0.5  0.3 - 1.2 mg/dL Final  . GFR calc non Af Amer 10/10/2015 44* >60 mL/min Final  . GFR calc Af Amer 10/10/2015 51* >60 mL/min Final   Comment: (NOTE) The eGFR has been calculated using the CKD EPI equation. This calculation has not been validated in all clinical situations. eGFR's persistently <60 mL/min signify possible Chronic Kidney Disease.   . Anion gap 10/10/2015 9  5 - 15 Final   Lab Results  Component Value Date   PSA 2.92 08/02/2015    STUDIES: No results found.  ASSESSMENT:  Prostate cancer with retroperitoneal adenopathy.  PLAN:  1. Prostate cancer with retroperitoneal adenopathy: Patient recently initiated 119m Xtandi and is tolerating it well without significant side effects. PSA was initially trending up, but today's result is pending. Given his retroperitoneal adenopathy patient likely has progression of disease.  Patient last received Lupron in July 2017. We will consider nuclear med bone scan in the near future. Return to clinic in 6 weeks for repeat laboratory work and further evaluation.  2. Dizziness: Likely secondary to postural hypotension, monitor. 3. Shortness of breath: Patient attributes this to his weight gain, monitor.   Approximately 30 minutes was spent discussion of which greater than 50% was consultation.  Patient expressed understanding and was in agreement with this plan. He also understands that He can call clinic at any time with any questions, concerns, or complaints.    TLloyd Huger MD   04/26/2015 10:58 AM

## 2015-10-10 ENCOUNTER — Inpatient Hospital Stay: Payer: Medicare Other | Attending: Oncology

## 2015-10-10 ENCOUNTER — Encounter: Payer: Self-pay | Admitting: Oncology

## 2015-10-10 ENCOUNTER — Inpatient Hospital Stay (HOSPITAL_BASED_OUTPATIENT_CLINIC_OR_DEPARTMENT_OTHER): Payer: Medicare Other | Admitting: Oncology

## 2015-10-10 ENCOUNTER — Other Ambulatory Visit: Payer: Medicare Other

## 2015-10-10 ENCOUNTER — Ambulatory Visit: Payer: Medicare Other | Admitting: Oncology

## 2015-10-10 VITALS — BP 136/78 | HR 91 | Temp 97.6°F | Wt 221.0 lb

## 2015-10-10 DIAGNOSIS — Z23 Encounter for immunization: Secondary | ICD-10-CM

## 2015-10-10 DIAGNOSIS — C61 Malignant neoplasm of prostate: Secondary | ICD-10-CM | POA: Diagnosis present

## 2015-10-10 DIAGNOSIS — Z7984 Long term (current) use of oral hypoglycemic drugs: Secondary | ICD-10-CM | POA: Insufficient documentation

## 2015-10-10 DIAGNOSIS — R42 Dizziness and giddiness: Secondary | ICD-10-CM | POA: Insufficient documentation

## 2015-10-10 DIAGNOSIS — R590 Localized enlarged lymph nodes: Secondary | ICD-10-CM | POA: Insufficient documentation

## 2015-10-10 DIAGNOSIS — R0602 Shortness of breath: Secondary | ICD-10-CM | POA: Diagnosis not present

## 2015-10-10 DIAGNOSIS — F17210 Nicotine dependence, cigarettes, uncomplicated: Secondary | ICD-10-CM | POA: Insufficient documentation

## 2015-10-10 DIAGNOSIS — Z7982 Long term (current) use of aspirin: Secondary | ICD-10-CM | POA: Diagnosis not present

## 2015-10-10 DIAGNOSIS — E119 Type 2 diabetes mellitus without complications: Secondary | ICD-10-CM

## 2015-10-10 DIAGNOSIS — I10 Essential (primary) hypertension: Secondary | ICD-10-CM | POA: Diagnosis not present

## 2015-10-10 DIAGNOSIS — C7951 Secondary malignant neoplasm of bone: Secondary | ICD-10-CM | POA: Insufficient documentation

## 2015-10-10 DIAGNOSIS — Z79899 Other long term (current) drug therapy: Secondary | ICD-10-CM

## 2015-10-10 LAB — COMPREHENSIVE METABOLIC PANEL
ALK PHOS: 73 U/L (ref 38–126)
ALT: 17 U/L (ref 17–63)
ANION GAP: 9 (ref 5–15)
AST: 20 U/L (ref 15–41)
Albumin: 4.1 g/dL (ref 3.5–5.0)
BILIRUBIN TOTAL: 0.5 mg/dL (ref 0.3–1.2)
BUN: 28 mg/dL — ABNORMAL HIGH (ref 6–20)
CALCIUM: 9.3 mg/dL (ref 8.9–10.3)
CO2: 23 mmol/L (ref 22–32)
Chloride: 103 mmol/L (ref 101–111)
Creatinine, Ser: 1.46 mg/dL — ABNORMAL HIGH (ref 0.61–1.24)
GFR, EST AFRICAN AMERICAN: 51 mL/min — AB (ref >60)
GFR, EST NON AFRICAN AMERICAN: 44 mL/min — AB (ref >60)
GLUCOSE: 163 mg/dL — AB (ref 65–99)
Potassium: 4.6 mmol/L (ref 3.5–5.1)
Sodium: 135 mmol/L (ref 135–145)
TOTAL PROTEIN: 7.5 g/dL (ref 6.5–8.1)

## 2015-10-10 LAB — CBC WITH DIFFERENTIAL/PLATELET
Basophils Absolute: 0.1 10*3/uL (ref 0–0.1)
Basophils Relative: 1 %
Eosinophils Absolute: 0.2 10*3/uL (ref 0–0.7)
Eosinophils Relative: 2 %
HEMATOCRIT: 35.2 % — AB (ref 40.0–52.0)
HEMOGLOBIN: 12.1 g/dL — AB (ref 13.0–18.0)
LYMPHS ABS: 2.1 10*3/uL (ref 1.0–3.6)
Lymphocytes Relative: 26 %
MCH: 30.6 pg (ref 26.0–34.0)
MCHC: 34.5 g/dL (ref 32.0–36.0)
MCV: 88.8 fL (ref 80.0–100.0)
MONO ABS: 0.5 10*3/uL (ref 0.2–1.0)
MONOS PCT: 6 %
NEUTROS ABS: 5.3 10*3/uL (ref 1.4–6.5)
NEUTROS PCT: 65 %
Platelets: 239 10*3/uL (ref 150–440)
RBC: 3.96 MIL/uL — ABNORMAL LOW (ref 4.40–5.90)
RDW: 13.8 % (ref 11.5–14.5)
WBC: 8.1 10*3/uL (ref 3.8–10.6)

## 2015-10-10 LAB — PSA: PSA: 0.53 ng/mL (ref 0.00–4.00)

## 2015-10-10 MED ORDER — INFLUENZA VAC SPLIT QUAD 0.5 ML IM SUSY
0.5000 mL | PREFILLED_SYRINGE | Freq: Once | INTRAMUSCULAR | Status: AC
  Administered 2015-10-10: 0.5 mL via INTRAMUSCULAR
  Filled 2015-10-10: qty 0.5

## 2015-10-10 NOTE — Progress Notes (Signed)
Patient here today for follow up regarding prostate cancer. Patient currently on Xtandi, began treatment on 09/14/15. Patient reports tolerating Xtandi well. Patient does report dizziness and shortness of breath today.

## 2015-10-19 ENCOUNTER — Telehealth: Payer: Self-pay | Admitting: *Deleted

## 2015-10-19 MED ORDER — ENZALUTAMIDE 40 MG PO CAPS: 160.0000 mg | ORAL_CAPSULE | Freq: Every day | ORAL | 3 refills | Status: DC

## 2015-10-19 NOTE — Telephone Encounter (Signed)
Pt states that will not get xtandi refill for another 2 weeks and would be out of medication by then. Re[facility] and was informed that the patient would get prescription delivered on Monday or Tuesday next week. Also, pt requests 90 day supply of prescription. Will send new script with 90 day supply. Pt aware.

## 2015-11-20 ENCOUNTER — Other Ambulatory Visit: Payer: Self-pay | Admitting: *Deleted

## 2015-11-20 DIAGNOSIS — C61 Malignant neoplasm of prostate: Secondary | ICD-10-CM

## 2015-11-20 NOTE — Progress Notes (Signed)
[place]  Telephone:(336) [phone]  Fax:(336) [place] DOB: 08-Aug-1933  MR#: [id number]  YCX#:[id number]  Patient Care Team: Pcp Not In System as PCP - General   Patient seen on October 18, 2014.  CHIEF COMPLAINT: Prostate cancer with retroperitoneal adenopathy.  INTERVAL HISTORY:  Patient returns to clinic today for evaluation, laboratory work, and to assess his toleration of Xtandi. He currently feels well and is asymptomatic. He has no neurologic complaints. He denies any recent fevers or illnesses. He has a good appetite and denies weight loss. He denies any chest pain or shortness of breath. He denies any nausea, vomiting, constipation, or diarrhea. He has no urinary complaints. Patient offers no specific complaints today.   REVIEW OF SYSTEMS:   Review of Systems  Constitutional: Negative for diaphoresis, fever, malaise/fatigue and weight loss.  Respiratory: Negative for cough and shortness of breath.   Cardiovascular: Negative.  Negative for chest pain and leg swelling.  Gastrointestinal: Negative.  Negative for abdominal pain.  Genitourinary: Negative.   Musculoskeletal: Negative.   Neurological: Positive for sensory change. Negative for dizziness and weakness.  Psychiatric/Behavioral: Negative.  The patient is not nervous/anxious.     As per HPI. Otherwise, a complete review of systems is negative.  ONCOLOGY HISTORY: Oncology History   1. prostate cancer, extra capsule extention, also illiac and retroperitoneal adenopathy on ct, small lesion liver possible metastatic, minimal uptake one area t spine. Asymptomatic. Not a surgical candidate with CT findings. Considdered not bulk and not visceral disease. Psa 13.6 prior to tx...casodex , then lupron first dose 12/31     Primary prostate cancer with metastasis from prostate to other site [facility])    PAST MEDICAL HISTORY: Past Medical History:  Diagnosis Date  . Cancer of prostate  ([place]) 07/13/2014  . Diabetes mellitus without complication [facility])    Patient takes Metformin  . Hypertension     PAST SURGICAL HISTORY: No past surgical history on file.  FAMILY HISTORY: Reviewed and unchanged. No reported history of malignancy or chronic disease.  GYNECOLOGIC HISTORY:  No LMP for male patient.     ADVANCED DIRECTIVES:    HEALTH MAINTENANCE: Social History  Substance Use Topics  . Smoking status: Current Every Day Smoker  . Smokeless tobacco: Never Used  . Alcohol use 0.0 oz/week     No Known Allergies  Current Outpatient Prescriptions  Medication Sig Dispense Refill  . aspirin 325 MG EC tablet Take 325 mg by mouth daily.    . enzalutamide (XTANDI) 40 MG capsule Take 4 capsules (160 mg total) by mouth daily. 360 capsule 3  . fluvastatin (LESCOL) 20 MG capsule Take 20 mg by mouth at bedtime.    Marland Kitchen lisinopril-hydrochlorothiazide (PRINZIDE,ZESTORETIC) 10-12.5 MG tablet Take 1 tablet by mouth daily.    Marland Kitchen lisinopril-hydrochlorothiazide (PRINZIDE,ZESTORETIC) 20-12.5 MG tablet     . metFORMIN (GLUCOPHAGE) 500 MG tablet Take 500 mg by mouth 2 (two) times daily with a meal.     . niacin (NIASPAN) 500 MG CR tablet Take 500 mg by mouth at bedtime.    . Omega-3 Fatty Acids (FISH OIL) 1000 MG CAPS Take by mouth 4 (four) times daily.    Marland Kitchen omeprazole (PRILOSEC) 20 MG capsule Take 1 capsule (20 mg total) by mouth daily. 90 capsule 3  . tamsulosin (FLOMAX) 0.4 MG CAPS capsule Take 0.4 mg by mouth 2 (two) times daily.     No current facility-administered medications for this visit.  OBJECTIVE: BP 132/79 (BP Location: Left Arm, Patient Position: Sitting)   Pulse 78   Temp 97.1 F (36.2 C) (Tympanic)   Resp 18   Wt 218 lb 4.1 oz (99 kg)    There is no height or weight on file to calculate BMI.    ECOG FS:1 - Symptomatic but completely ambulatory  General: Well-developed, well-nourished, no acute distress. Eyes: Pink conjunctiva, anicteric sclera. HEENT:  Normocephalic, moist mucous membranes, clear oropharnyx. Lungs: Clear to auscultation bilaterally. Heart: Regular rate and rhythm. No rubs, murmurs, or gallops. Musculoskeletal: No edema, cyanosis, or clubbing. Occasional knee and joint pain. Neuro: Alert, answering all questions appropriately. Cranial nerves grossly intact. Skin: No rashes or petechiae noted. Psych: Normal affect.   LAB RESULTS:  Appointment on 11/21/2015  Component Date Value Ref Range Status  . WBC 11/21/2015 7.5  3.8 - 10.6 K/uL Final  . RBC 11/21/2015 3.82* 4.40 - 5.90 MIL/uL Final  . Hemoglobin 11/21/2015 11.6* 13.0 - 18.0 g/dL Final  . HCT 11/21/2015 34.2* 40.0 - 52.0 % Final  . MCV 11/21/2015 89.5  80.0 - 100.0 fL Final  . MCH 11/21/2015 30.3  26.0 - 34.0 pg Final  . MCHC 11/21/2015 33.9  32.0 - 36.0 g/dL Final  . RDW 11/21/2015 14.5  11.5 - 14.5 % Final  . Platelets 11/21/2015 244  150 - 440 K/uL Final  . Neutrophils Relative % 11/21/2015 60  % Final  . Neutro Abs 11/21/2015 4.5  1.4 - 6.5 K/uL Final  . Lymphocytes Relative 11/21/2015 30  % Final  . Lymphs Abs 11/21/2015 2.2  1.0 - 3.6 K/uL Final  . Monocytes Relative 11/21/2015 7  % Final  . Monocytes Absolute 11/21/2015 0.6  0.2 - 1.0 K/uL Final  . Eosinophils Relative 11/21/2015 2  % Final  . Eosinophils Absolute 11/21/2015 0.1  0 - 0.7 K/uL Final  . Basophils Relative 11/21/2015 1  % Final  . Basophils Absolute 11/21/2015 0.0  0 - 0.1 K/uL Final  . Sodium 11/21/2015 135  135 - 145 mmol/L Final  . Potassium 11/21/2015 4.6  3.5 - 5.1 mmol/L Final  . Chloride 11/21/2015 105  101 - 111 mmol/L Final  . CO2 11/21/2015 21* 22 - 32 mmol/L Final  . Glucose, Bld 11/21/2015 136* 65 - 99 mg/dL Final  . BUN 11/21/2015 30* 6 - 20 mg/dL Final  . Creatinine, Ser 11/21/2015 1.34* 0.61 - 1.24 mg/dL Final  . Calcium 11/21/2015 9.4  8.9 - 10.3 mg/dL Final  . Total Protein 11/21/2015 7.4  6.5 - 8.1 g/dL Final  . Albumin 11/21/2015 3.7  3.5 - 5.0 g/dL Final  . AST  11/21/2015 20  15 - 41 U/L Final  . ALT 11/21/2015 17  17 - 63 U/L Final  . Alkaline Phosphatase 11/21/2015 66  38 - 126 U/L Final  . Total Bilirubin 11/21/2015 0.4  0.3 - 1.2 mg/dL Final  . GFR calc non Af Amer 11/21/2015 48* >60 mL/min Final  . GFR calc Af Amer 11/21/2015 56* >60 mL/min Final   Comment: (NOTE) The eGFR has been calculated using the CKD EPI equation. This calculation has not been validated in all clinical situations. eGFR's persistently <60 mL/min signify possible Chronic Kidney Disease.   . Anion gap 11/21/2015 9  5 - 15 Final  . PSA 11/21/2015 0.19  0.00 - 4.00 ng/mL Final   Comment: (NOTE) While PSA levels of <=4.0 ng/ml are reported as reference range, some men with levels below 4.0 ng/ml can have  prostate cancer and many men with PSA above 4.0 ng/ml do not have prostate cancer.  Other tests such as free PSA, age specific reference ranges, PSA velocity and PSA doubling time may be helpful especially in men less than 25 years old. Performed at [facility]    Lab Results  Component Value Date   PSA 0.19 11/21/2015    STUDIES: No results found.  ASSESSMENT:  Prostate cancer with retroperitoneal adenopathy.  PLAN:     1. Prostate cancer with retroperitoneal adenopathy: Continue 18m Xtandi daily. PSA continues to trend down. Given his retroperitoneal adenopathy patient likely has progression of disease.  Patient last received Lupron in July 2017. We will consider nuclear med bone scan in the near future if necessary. Return to clinic in 3 months for repeat laboratory work and further evaluation.  2. Chronic renal insufficiency: Patient's creatinine appears to be at his baseline.  Approximately 30 minutes was spent discussion of which greater than 50% was consultation.  Patient expressed understanding and was in agreement with this plan. He also understands that He can call clinic at any time with any questions, concerns, or complaints.     TLloyd Huger MD   04/26/2015 10:52 PM

## 2015-11-21 ENCOUNTER — Inpatient Hospital Stay: Payer: Medicare Other | Attending: Oncology

## 2015-11-21 ENCOUNTER — Inpatient Hospital Stay (HOSPITAL_BASED_OUTPATIENT_CLINIC_OR_DEPARTMENT_OTHER): Payer: Medicare Other | Admitting: Oncology

## 2015-11-21 VITALS — BP 132/79 | HR 78 | Temp 97.1°F | Resp 18 | Wt 218.3 lb

## 2015-11-21 DIAGNOSIS — M899 Disorder of bone, unspecified: Secondary | ICD-10-CM | POA: Diagnosis not present

## 2015-11-21 DIAGNOSIS — I129 Hypertensive chronic kidney disease with stage 1 through stage 4 chronic kidney disease, or unspecified chronic kidney disease: Secondary | ICD-10-CM

## 2015-11-21 DIAGNOSIS — Z7982 Long term (current) use of aspirin: Secondary | ICD-10-CM

## 2015-11-21 DIAGNOSIS — F17210 Nicotine dependence, cigarettes, uncomplicated: Secondary | ICD-10-CM | POA: Diagnosis not present

## 2015-11-21 DIAGNOSIS — C61 Malignant neoplasm of prostate: Secondary | ICD-10-CM | POA: Diagnosis not present

## 2015-11-21 DIAGNOSIS — R599 Enlarged lymph nodes, unspecified: Secondary | ICD-10-CM

## 2015-11-21 DIAGNOSIS — Z79899 Other long term (current) drug therapy: Secondary | ICD-10-CM

## 2015-11-21 DIAGNOSIS — Z7984 Long term (current) use of oral hypoglycemic drugs: Secondary | ICD-10-CM | POA: Insufficient documentation

## 2015-11-21 DIAGNOSIS — N189 Chronic kidney disease, unspecified: Secondary | ICD-10-CM

## 2015-11-21 DIAGNOSIS — E119 Type 2 diabetes mellitus without complications: Secondary | ICD-10-CM

## 2015-11-21 DIAGNOSIS — K769 Liver disease, unspecified: Secondary | ICD-10-CM | POA: Diagnosis not present

## 2015-11-21 LAB — CBC WITH DIFFERENTIAL/PLATELET
Basophils Absolute: 0 10*3/uL (ref 0–0.1)
Basophils Relative: 1 %
EOS PCT: 2 %
Eosinophils Absolute: 0.1 10*3/uL (ref 0–0.7)
HEMATOCRIT: 34.2 % — AB (ref 40.0–52.0)
Hemoglobin: 11.6 g/dL — ABNORMAL LOW (ref 13.0–18.0)
LYMPHS ABS: 2.2 10*3/uL (ref 1.0–3.6)
LYMPHS PCT: 30 %
MCH: 30.3 pg (ref 26.0–34.0)
MCHC: 33.9 g/dL (ref 32.0–36.0)
MCV: 89.5 fL (ref 80.0–100.0)
MONO ABS: 0.6 10*3/uL (ref 0.2–1.0)
Monocytes Relative: 7 %
NEUTROS ABS: 4.5 10*3/uL (ref 1.4–6.5)
Neutrophils Relative %: 60 %
PLATELETS: 244 10*3/uL (ref 150–440)
RBC: 3.82 MIL/uL — ABNORMAL LOW (ref 4.40–5.90)
RDW: 14.5 % (ref 11.5–14.5)
WBC: 7.5 10*3/uL (ref 3.8–10.6)

## 2015-11-21 LAB — COMPREHENSIVE METABOLIC PANEL
ALT: 17 U/L (ref 17–63)
AST: 20 U/L (ref 15–41)
Albumin: 3.7 g/dL (ref 3.5–5.0)
Alkaline Phosphatase: 66 U/L (ref 38–126)
Anion gap: 9 (ref 5–15)
BILIRUBIN TOTAL: 0.4 mg/dL (ref 0.3–1.2)
BUN: 30 mg/dL — AB (ref 6–20)
CHLORIDE: 105 mmol/L (ref 101–111)
CO2: 21 mmol/L — ABNORMAL LOW (ref 22–32)
CREATININE: 1.34 mg/dL — AB (ref 0.61–1.24)
Calcium: 9.4 mg/dL (ref 8.9–10.3)
GFR, EST AFRICAN AMERICAN: 56 mL/min — AB (ref >60)
GFR, EST NON AFRICAN AMERICAN: 48 mL/min — AB (ref >60)
Glucose, Bld: 136 mg/dL — ABNORMAL HIGH (ref 65–99)
POTASSIUM: 4.6 mmol/L (ref 3.5–5.1)
Sodium: 135 mmol/L (ref 135–145)
TOTAL PROTEIN: 7.4 g/dL (ref 6.5–8.1)

## 2015-11-21 LAB — PSA: PSA: 0.19 ng/mL (ref 0.00–4.00)

## 2015-11-21 NOTE — Progress Notes (Signed)
Offers no complaints. Pt states has been taking xtandi as prescribed without missing any doses. Pt states understanding that is to take 4 capsules (160mg  total) xtandi daily.

## 2015-12-04 ENCOUNTER — Ambulatory Visit: Payer: Medicare Other

## 2015-12-04 ENCOUNTER — Ambulatory Visit: Payer: Medicare Other | Admitting: Oncology

## 2015-12-04 ENCOUNTER — Other Ambulatory Visit: Payer: Medicare Other

## 2016-02-21 ENCOUNTER — Ambulatory Visit: Payer: Medicare Other | Admitting: Oncology

## 2016-02-21 ENCOUNTER — Other Ambulatory Visit: Payer: Medicare Other

## 2016-02-21 NOTE — Progress Notes (Signed)
[place]  Telephone:(336) [phone]  Fax:(336) [place] DOB: 1933-09-18  MR#: [id number]  UUV#:[id number]  Patient Care Team: Pcp Not In System as PCP - General   Patient seen on October 18, 2014.  CHIEF COMPLAINT: Prostate cancer with retroperitoneal adenopathy.  INTERVAL HISTORY:  Patient returns to clinic today for evaluation, laboratory work, and to assess his toleration of Xtandi. He currently feels well and is asymptomatic. He has no neurologic complaints. He denies any recent fevers or illnesses. He has a good appetite and denies weight loss. He denies any chest pain or shortness of breath. He denies any nausea, vomiting, constipation, or diarrhea. He has no urinary complaints. Patient offers no specific complaints today.   REVIEW OF SYSTEMS:   Review of Systems  Constitutional: Negative for diaphoresis, fever, malaise/fatigue and weight loss.  Respiratory: Negative for cough and shortness of breath.   Cardiovascular: Negative.  Negative for chest pain and leg swelling.  Gastrointestinal: Negative.  Negative for abdominal pain.  Genitourinary: Negative.   Musculoskeletal: Negative.   Neurological: Negative for dizziness, sensory change and weakness.  Psychiatric/Behavioral: Negative.  The patient is not nervous/anxious.     As per HPI. Otherwise, a complete review of systems is negative.  ONCOLOGY HISTORY: Oncology History   1. prostate cancer, extra capsule extention, also illiac and retroperitoneal adenopathy on ct, small lesion liver possible metastatic, minimal uptake one area t spine. Asymptomatic. Not a surgical candidate with CT findings. Considdered not bulk and not visceral disease. Psa 13.6 prior to tx...casodex , then lupron first dose 12/31     Primary prostate cancer with metastasis from prostate to other site [facility])    PAST MEDICAL HISTORY: Past Medical History:  Diagnosis Date  . Cancer of prostate ([place]) 07/13/2014    . Diabetes mellitus without complication [facility])    Patient takes Metformin  . Hypertension     PAST SURGICAL HISTORY: No past surgical history on file.  FAMILY HISTORY: Reviewed and unchanged. No reported history of malignancy or chronic disease.  GYNECOLOGIC HISTORY:  No LMP for male patient.     ADVANCED DIRECTIVES:    HEALTH MAINTENANCE: Social History  Substance Use Topics  . Smoking status: Current Every Day Smoker  . Smokeless tobacco: Never Used  . Alcohol use 0.0 oz/week     No Known Allergies  Current Outpatient Prescriptions  Medication Sig Dispense Refill  . aspirin 325 MG EC tablet Take 325 mg by mouth daily.    . enzalutamide (XTANDI) 40 MG capsule Take 4 capsules (160 mg total) by mouth daily. 360 capsule 3  . fluvastatin (LESCOL) 20 MG capsule Take 20 mg by mouth at bedtime.    Marland Kitchen lisinopril-hydrochlorothiazide (PRINZIDE,ZESTORETIC) 10-12.5 MG tablet Take 1 tablet by mouth daily.    . niacin (NIASPAN) 500 MG CR tablet Take 500 mg by mouth at bedtime.    . Omega-3 Fatty Acids (FISH OIL) 1000 MG CAPS Take by mouth 4 (four) times daily.    . tamsulosin (FLOMAX) 0.4 MG CAPS capsule Take 0.4 mg by mouth 2 (two) times daily.    Marland Kitchen omeprazole (PRILOSEC) 20 MG capsule Take 1 capsule (20 mg total) by mouth daily. (Patient not taking: Reported on 02/22/2016) 90 capsule 3   No current facility-administered medications for this visit.     OBJECTIVE: BP 136/78 (BP Location: Left Arm, Patient Position: Sitting)   Pulse 98   Temp 97 F (36.1 C) (Tympanic)   Ht  '5\' 8"'  (1.727 m)   Wt 219 lb 12.8 oz (99.7 kg)   BMI 33.42 kg/m    Body mass index is 33.42 kg/m.    ECOG FS:1 - Symptomatic but completely ambulatory  General: Well-developed, well-nourished, no acute distress. Eyes: Pink conjunctiva, anicteric sclera. HEENT: Normocephalic, moist mucous membranes, clear oropharnyx. Lungs: Clear to auscultation bilaterally. Heart: Regular rate and rhythm. No rubs, murmurs,  or gallops. Musculoskeletal: No edema, cyanosis, or clubbing. Occasional knee and joint pain. Neuro: Alert, answering all questions appropriately. Cranial nerves grossly intact. Skin: No rashes or petechiae noted. Psych: Normal affect.   LAB RESULTS:  Appointment on 02/22/2016  Component Date Value Ref Range Status  . PSA 02/22/2016 0.28  0.00 - 4.00 ng/mL Final   Comment: (NOTE) While PSA levels of <=4.0 ng/ml are reported as reference range, some men with levels below 4.0 ng/ml can have prostate cancer and many men with PSA above 4.0 ng/ml do not have prostate cancer.  Other tests such as free PSA, age specific reference ranges, PSA velocity and PSA doubling time may be helpful especially in men less than 3 years old. Performed at [facility], [place] [address] [place],  [address]   . WBC 02/22/2016 8.7  3.8 - 10.6 K/uL Final  . RBC 02/22/2016 3.76* 4.40 - 5.90 MIL/uL Final  . Hemoglobin 02/22/2016 11.5* 13.0 - 18.0 g/dL Final  . HCT 02/22/2016 33.7* 40.0 - 52.0 % Final  . MCV 02/22/2016 89.6  80.0 - 100.0 fL Final  . MCH 02/22/2016 30.6  26.0 - 34.0 pg Final  . MCHC 02/22/2016 34.1  32.0 - 36.0 g/dL Final  . RDW 02/22/2016 14.5  11.5 - 14.5 % Final  . Platelets 02/22/2016 291  150 - 440 K/uL Final  . Neutrophils Relative % 02/22/2016 63  % Final  . Neutro Abs 02/22/2016 5.4  1.4 - 6.5 K/uL Final  . Lymphocytes Relative 02/22/2016 28  % Final  . Lymphs Abs 02/22/2016 2.4  1.0 - 3.6 K/uL Final  . Monocytes Relative 02/22/2016 7  % Final  . Monocytes Absolute 02/22/2016 0.6  0.2 - 1.0 K/uL Final  . Eosinophils Relative 02/22/2016 2  % Final  . Eosinophils Absolute 02/22/2016 0.2  0 - 0.7 K/uL Final  . Basophils Relative 02/22/2016 0  % Final  . Basophils Absolute 02/22/2016 0.0  0 - 0.1 K/uL Final  . Sodium 02/22/2016 132* 135 - 145 mmol/L Final  . Potassium 02/22/2016 4.6  3.5 - 5.1 mmol/L Final  . Chloride 02/22/2016 101  101 - 111 mmol/L Final  . CO2  02/22/2016 24  22 - 32 mmol/L Final  . Glucose, Bld 02/22/2016 142* 65 - 99 mg/dL Final  . BUN 02/22/2016 28* 6 - 20 mg/dL Final  . Creatinine, Ser 02/22/2016 1.21  0.61 - 1.24 mg/dL Final  . Calcium 02/22/2016 9.2  8.9 - 10.3 mg/dL Final  . Total Protein 02/22/2016 7.6  6.5 - 8.1 g/dL Final  . Albumin 02/22/2016 3.8  3.5 - 5.0 g/dL Final  . AST 02/22/2016 18  15 - 41 U/L Final  . ALT 02/22/2016 13* 17 - 63 U/L Final  . Alkaline Phosphatase 02/22/2016 73  38 - 126 U/L Final  . Total Bilirubin 02/22/2016 0.4  0.3 - 1.2 mg/dL Final  . GFR calc non Af Amer 02/22/2016 55* >60 mL/min Final  . GFR calc Af Amer 02/22/2016 >60  >60 mL/min Final   Comment: (NOTE) The eGFR has been calculated  using the CKD EPI equation. This calculation has not been validated in all clinical situations. eGFR's persistently <60 mL/min signify possible Chronic Kidney Disease.   . Anion gap 02/22/2016 7  5 - 15 Final   Lab Results  Component Value Date   PSA 0.28 02/22/2016    STUDIES: No results found.  ASSESSMENT:  Prostate cancer with retroperitoneal adenopathy.  PLAN:     1. Prostate cancer with retroperitoneal adenopathy: Continue 130m Xtandi daily. PSA is essentially stable at 0.28. Given his retroperitoneal adenopathy patient likely has progression of disease.  Patient last received Lupron in July 2017. Will consider nuclear med bone scan in the near future if necessary. Return to clinic in 3 months for repeat laboratory work and further evaluation.  2. Chronic renal insufficiency: Patient's creatinine appears to be at his baseline.   Patient expressed understanding and was in agreement with this plan. He also understands that He can call clinic at any time with any questions, concerns, or complaints.    TLloyd Huger MD   04/26/2015 8:07 AM

## 2016-02-22 ENCOUNTER — Inpatient Hospital Stay (HOSPITAL_BASED_OUTPATIENT_CLINIC_OR_DEPARTMENT_OTHER): Payer: Medicare Other | Admitting: Oncology

## 2016-02-22 ENCOUNTER — Inpatient Hospital Stay: Payer: Medicare Other | Attending: Oncology

## 2016-02-22 VITALS — BP 136/78 | HR 98 | Temp 97.0°F | Ht 68.0 in | Wt 219.8 lb

## 2016-02-22 DIAGNOSIS — Z7984 Long term (current) use of oral hypoglycemic drugs: Secondary | ICD-10-CM | POA: Insufficient documentation

## 2016-02-22 DIAGNOSIS — Z79818 Long term (current) use of other agents affecting estrogen receptors and estrogen levels: Secondary | ICD-10-CM | POA: Insufficient documentation

## 2016-02-22 DIAGNOSIS — C61 Malignant neoplasm of prostate: Secondary | ICD-10-CM | POA: Diagnosis present

## 2016-02-22 DIAGNOSIS — N2889 Other specified disorders of kidney and ureter: Secondary | ICD-10-CM | POA: Insufficient documentation

## 2016-02-22 DIAGNOSIS — Z79899 Other long term (current) drug therapy: Secondary | ICD-10-CM | POA: Diagnosis not present

## 2016-02-22 DIAGNOSIS — F17210 Nicotine dependence, cigarettes, uncomplicated: Secondary | ICD-10-CM | POA: Insufficient documentation

## 2016-02-22 DIAGNOSIS — E119 Type 2 diabetes mellitus without complications: Secondary | ICD-10-CM | POA: Insufficient documentation

## 2016-02-22 DIAGNOSIS — K769 Liver disease, unspecified: Secondary | ICD-10-CM

## 2016-02-22 DIAGNOSIS — R599 Enlarged lymph nodes, unspecified: Secondary | ICD-10-CM | POA: Diagnosis not present

## 2016-02-22 DIAGNOSIS — I10 Essential (primary) hypertension: Secondary | ICD-10-CM | POA: Diagnosis not present

## 2016-02-22 LAB — CBC WITH DIFFERENTIAL/PLATELET
BASOS ABS: 0 10*3/uL (ref 0–0.1)
Basophils Relative: 0 %
Eosinophils Absolute: 0.2 10*3/uL (ref 0–0.7)
Eosinophils Relative: 2 %
HEMATOCRIT: 33.7 % — AB (ref 40.0–52.0)
Hemoglobin: 11.5 g/dL — ABNORMAL LOW (ref 13.0–18.0)
LYMPHS ABS: 2.4 10*3/uL (ref 1.0–3.6)
LYMPHS PCT: 28 %
MCH: 30.6 pg (ref 26.0–34.0)
MCHC: 34.1 g/dL (ref 32.0–36.0)
MCV: 89.6 fL (ref 80.0–100.0)
MONO ABS: 0.6 10*3/uL (ref 0.2–1.0)
MONOS PCT: 7 %
NEUTROS ABS: 5.4 10*3/uL (ref 1.4–6.5)
Neutrophils Relative %: 63 %
Platelets: 291 10*3/uL (ref 150–440)
RBC: 3.76 MIL/uL — ABNORMAL LOW (ref 4.40–5.90)
RDW: 14.5 % (ref 11.5–14.5)
WBC: 8.7 10*3/uL (ref 3.8–10.6)

## 2016-02-22 LAB — COMPREHENSIVE METABOLIC PANEL
ALT: 13 U/L — ABNORMAL LOW (ref 17–63)
AST: 18 U/L (ref 15–41)
Albumin: 3.8 g/dL (ref 3.5–5.0)
Alkaline Phosphatase: 73 U/L (ref 38–126)
Anion gap: 7 (ref 5–15)
BILIRUBIN TOTAL: 0.4 mg/dL (ref 0.3–1.2)
BUN: 28 mg/dL — AB (ref 6–20)
CO2: 24 mmol/L (ref 22–32)
Calcium: 9.2 mg/dL (ref 8.9–10.3)
Chloride: 101 mmol/L (ref 101–111)
Creatinine, Ser: 1.21 mg/dL (ref 0.61–1.24)
GFR calc Af Amer: >60 mL/min (ref >60)
GFR, EST NON AFRICAN AMERICAN: 55 mL/min — AB (ref >60)
Glucose, Bld: 142 mg/dL — ABNORMAL HIGH (ref 65–99)
POTASSIUM: 4.6 mmol/L (ref 3.5–5.1)
Sodium: 132 mmol/L — ABNORMAL LOW (ref 135–145)
TOTAL PROTEIN: 7.6 g/dL (ref 6.5–8.1)

## 2016-02-22 LAB — PSA: PSA: 0.28 ng/mL (ref 0.00–4.00)

## 2016-02-22 NOTE — Progress Notes (Signed)
Patient here for follow up no changes in condition since last appointment.

## 2016-02-23 ENCOUNTER — Telehealth: Payer: Self-pay

## 2016-02-23 NOTE — Telephone Encounter (Signed)
Called patient with PSA results as requested 02/22/16 when he was seen in the office. Gave him the results and he voiced understanding and we will see him in 3 months.

## 2016-05-19 NOTE — Progress Notes (Signed)
[place]  Telephone:([phone]) [phone]  Fax:(336) [place] DOB: 10-16-1933  MR#: [id number]  CBJ#:[id number]  Patient Care Team: System, Pcp Not In as PCP - General   Patient seen on October 18, 2014.  CHIEF COMPLAINT: Prostate cancer with retroperitoneal adenopathy.  INTERVAL HISTORY:  Patient returns to clinic today for evaluation, laboratory work, and continuation of Xtandi. He currently feels well and is asymptomatic. He complains of nontender gynecomastia. He has no neurologic complaints. He denies any recent fevers or illnesses. He has a good appetite and denies weight loss. He denies any chest pain or shortness of breath. He denies any nausea, vomiting, constipation, or diarrhea. He has no urinary complaints. Patient offers no further specific complaints today.   REVIEW OF SYSTEMS:   Review of Systems  Constitutional: Negative for diaphoresis, fever, malaise/fatigue and weight loss.  Respiratory: Negative for cough and shortness of breath.   Cardiovascular: Negative.  Negative for chest pain and leg swelling.  Gastrointestinal: Negative.  Negative for abdominal pain.  Genitourinary: Negative.   Musculoskeletal: Negative.   Neurological: Negative for dizziness, sensory change and weakness.  Psychiatric/Behavioral: Negative.  The patient is not nervous/anxious.     As per HPI. Otherwise, a complete review of systems is negative.  ONCOLOGY HISTORY: Oncology History   1. prostate cancer, extra capsule extention, also illiac and retroperitoneal adenopathy on ct, small lesion liver possible metastatic, minimal uptake one area t spine. Asymptomatic. Not a surgical candidate with CT findings. Considdered not bulk and not visceral disease. Psa 13.6 prior to tx...casodex , then lupron first dose 12/31     Primary prostate cancer with metastasis from prostate to other site [facility])    PAST MEDICAL HISTORY: Past Medical History:  Diagnosis Date    . Cancer of prostate ([place]) 07/13/2014  . Diabetes mellitus without complication [facility])    Patient takes Metformin  . Hypertension     PAST SURGICAL HISTORY: No past surgical history on file.  FAMILY HISTORY: Reviewed and unchanged. No reported history of malignancy or chronic disease.  GYNECOLOGIC HISTORY:  No LMP for male patient.     ADVANCED DIRECTIVES:    HEALTH MAINTENANCE: Social History  Substance Use Topics  . Smoking status: Current Every Day Smoker  . Smokeless tobacco: Never Used  . Alcohol use 0.0 oz/week     No Known Allergies  Current Outpatient Prescriptions  Medication Sig Dispense Refill  . aspirin 325 MG EC tablet Take 325 mg by mouth daily.    . enzalutamide (XTANDI) 40 MG capsule Take 4 capsules (160 mg total) by mouth daily. 360 capsule 3  . fluvastatin (LESCOL) 20 MG capsule Take 20 mg by mouth at bedtime.    Marland Kitchen lisinopril-hydrochlorothiazide (PRINZIDE,ZESTORETIC) 10-12.5 MG tablet Take 1 tablet by mouth daily.    . metFORMIN (GLUCOPHAGE) 500 MG tablet     . Omega-3 Fatty Acids (FISH OIL) 1000 MG CAPS Take by mouth 4 (four) times daily.    . tamsulosin (FLOMAX) 0.4 MG CAPS capsule Take 0.4 mg by mouth 2 (two) times daily.     No current facility-administered medications for this visit.     OBJECTIVE: BP 121/77 (BP Location: Left Arm, Patient Position: Sitting)   Pulse 66   Temp 98.5 F (36.9 C) (Tympanic)   Resp 18   Wt 219 lb 6.4 oz (99.5 kg)   BMI 33.36 kg/m    Body mass index is 33.36 kg/m.    ECOG FS:1 -  Symptomatic but completely ambulatory  General: Well-developed, well-nourished, no acute distress. Eyes: Pink conjunctiva, anicteric sclera. HEENT: Normocephalic, moist mucous membranes, clear oropharnyx. Breasts: Gynecomastia, without lumps or masses. Lungs: Clear to auscultation bilaterally. Heart: Regular rate and rhythm. No rubs, murmurs, or gallops. Musculoskeletal: No edema, cyanosis, or clubbing. Occasional knee and joint  pain. Neuro: Alert, answering all questions appropriately. Cranial nerves grossly intact. Skin: No rashes or petechiae noted. Psych: Normal affect.   LAB RESULTS:  Appointment on 05/21/2016  Component Date Value Ref Range Status  . Sodium 05/21/2016 134* 135 - 145 mmol/L Final  . Potassium 05/21/2016 4.6  3.5 - 5.1 mmol/L Final  . Chloride 05/21/2016 104  101 - 111 mmol/L Final  . CO2 05/21/2016 22  22 - 32 mmol/L Final  . Glucose, Bld 05/21/2016 177* 65 - 99 mg/dL Final  . BUN 05/21/2016 34* 6 - 20 mg/dL Final  . Creatinine, Ser 05/21/2016 1.49* 0.61 - 1.24 mg/dL Final  . Calcium 05/21/2016 9.3  8.9 - 10.3 mg/dL Final  . Total Protein 05/21/2016 7.0  6.5 - 8.1 g/dL Final  . Albumin 05/21/2016 3.7  3.5 - 5.0 g/dL Final  . AST 05/21/2016 21  15 - 41 U/L Final  . ALT 05/21/2016 12* 17 - 63 U/L Final  . Alkaline Phosphatase 05/21/2016 71  38 - 126 U/L Final  . Total Bilirubin 05/21/2016 0.5  0.3 - 1.2 mg/dL Final  . GFR calc non Af Amer 05/21/2016 43* >60 mL/min Final  . GFR calc Af Amer 05/21/2016 49* >60 mL/min Final   Comment: (NOTE) The eGFR has been calculated using the CKD EPI equation. This calculation has not been validated in all clinical situations. eGFR's persistently <60 mL/min signify possible Chronic Kidney Disease.   . Anion gap 05/21/2016 8  5 - 15 Final  . WBC 05/21/2016 7.8  3.8 - 10.6 K/uL Final  . RBC 05/21/2016 3.73* 4.40 - 5.90 MIL/uL Final  . Hemoglobin 05/21/2016 11.4* 13.0 - 18.0 g/dL Final  . HCT 05/21/2016 33.5* 40.0 - 52.0 % Final  . MCV 05/21/2016 90.0  80.0 - 100.0 fL Final  . MCH 05/21/2016 30.6  26.0 - 34.0 pg Final  . MCHC 05/21/2016 34.0  32.0 - 36.0 g/dL Final  . RDW 05/21/2016 14.7* 11.5 - 14.5 % Final  . Platelets 05/21/2016 259  150 - 440 K/uL Final  . Neutrophils Relative % 05/21/2016 57  % Final  . Neutro Abs 05/21/2016 4.4  1.4 - 6.5 K/uL Final  . Lymphocytes Relative 05/21/2016 34  % Final  . Lymphs Abs 05/21/2016 2.6  1.0 - 3.6 K/uL  Final  . Monocytes Relative 05/21/2016 6  % Final  . Monocytes Absolute 05/21/2016 0.5  0.2 - 1.0 K/uL Final  . Eosinophils Relative 05/21/2016 2  % Final  . Eosinophils Absolute 05/21/2016 0.1  0 - 0.7 K/uL Final  . Basophils Relative 05/21/2016 1  % Final  . Basophils Absolute 05/21/2016 0.1  0 - 0.1 K/uL Final  . PSA 05/21/2016 0.95  0.00 - 4.00 ng/mL Final   Comment: (NOTE) While PSA levels of <=4.0 ng/ml are reported as reference range, some men with levels below 4.0 ng/ml can have prostate cancer and many men with PSA above 4.0 ng/ml do not have prostate cancer.  Other tests such as free PSA, age specific reference ranges, PSA velocity and PSA doubling time may be helpful especially in men less than 89 years old. Performed at [facility], [place]  [address], [place], [place] [address]    Lab Results  Component Value Date   PSA 0.95 05/21/2016    STUDIES: No results found.  ASSESSMENT:  Prostate cancer with retroperitoneal adenopathy.  PLAN:     1. Prostate cancer with retroperitoneal adenopathy: Continue 184m Xtandi daily. PSA is Slowly trending up and is now 0.95. Patient last received Lupron in July 2017. Will consider nuclear med bone scan in the near future if necessary. Return to clinic in 3 months for repeat laboratory work only and then in 6 months for laboratory work and further evaluation.  2. Chronic renal insufficiency: Patient's creatinine appears to be at his baseline. 3. Gynecomastia: Possibly secondary to XKelso We discussed discontinuing treatment or possibly a dose reduction, which patient declined at this time.  Approximately 30 minutes was spent in discussion of which greater than 50% was consultation.  Patient expressed understanding and was in agreement with this plan. He also understands that He can call clinic at any time with any questions, concerns, or complaints.    TLloyd Huger MD   04/26/2015 1:37 PM

## 2016-05-21 ENCOUNTER — Inpatient Hospital Stay (HOSPITAL_BASED_OUTPATIENT_CLINIC_OR_DEPARTMENT_OTHER): Payer: Medicare Other | Admitting: Oncology

## 2016-05-21 ENCOUNTER — Inpatient Hospital Stay: Payer: Medicare Other | Attending: Oncology

## 2016-05-21 VITALS — BP 121/77 | HR 66 | Temp 98.5°F | Resp 18 | Wt 219.4 lb

## 2016-05-21 DIAGNOSIS — F17210 Nicotine dependence, cigarettes, uncomplicated: Secondary | ICD-10-CM

## 2016-05-21 DIAGNOSIS — I10 Essential (primary) hypertension: Secondary | ICD-10-CM | POA: Insufficient documentation

## 2016-05-21 DIAGNOSIS — Z7984 Long term (current) use of oral hypoglycemic drugs: Secondary | ICD-10-CM | POA: Insufficient documentation

## 2016-05-21 DIAGNOSIS — N62 Hypertrophy of breast: Secondary | ICD-10-CM

## 2016-05-21 DIAGNOSIS — E119 Type 2 diabetes mellitus without complications: Secondary | ICD-10-CM | POA: Insufficient documentation

## 2016-05-21 DIAGNOSIS — C61 Malignant neoplasm of prostate: Secondary | ICD-10-CM | POA: Diagnosis present

## 2016-05-21 DIAGNOSIS — C772 Secondary and unspecified malignant neoplasm of intra-abdominal lymph nodes: Secondary | ICD-10-CM | POA: Diagnosis not present

## 2016-05-21 DIAGNOSIS — Z7982 Long term (current) use of aspirin: Secondary | ICD-10-CM

## 2016-05-21 DIAGNOSIS — N2889 Other specified disorders of kidney and ureter: Secondary | ICD-10-CM | POA: Insufficient documentation

## 2016-05-21 LAB — COMPREHENSIVE METABOLIC PANEL
ALBUMIN: 3.7 g/dL (ref 3.5–5.0)
ALT: 12 U/L — ABNORMAL LOW (ref 17–63)
ANION GAP: 8 (ref 5–15)
AST: 21 U/L (ref 15–41)
Alkaline Phosphatase: 71 U/L (ref 38–126)
BUN: 34 mg/dL — ABNORMAL HIGH (ref 6–20)
CHLORIDE: 104 mmol/L (ref 101–111)
CO2: 22 mmol/L (ref 22–32)
Calcium: 9.3 mg/dL (ref 8.9–10.3)
Creatinine, Ser: 1.49 mg/dL — ABNORMAL HIGH (ref 0.61–1.24)
GFR calc Af Amer: 49 mL/min — ABNORMAL LOW (ref >60)
GFR calc non Af Amer: 43 mL/min — ABNORMAL LOW (ref >60)
GLUCOSE: 177 mg/dL — AB (ref 65–99)
POTASSIUM: 4.6 mmol/L (ref 3.5–5.1)
SODIUM: 134 mmol/L — AB (ref 135–145)
Total Bilirubin: 0.5 mg/dL (ref 0.3–1.2)
Total Protein: 7 g/dL (ref 6.5–8.1)

## 2016-05-21 LAB — PSA: PSA: 0.95 ng/mL (ref 0.00–4.00)

## 2016-05-21 LAB — CBC WITH DIFFERENTIAL/PLATELET
BASOS PCT: 1 %
Basophils Absolute: 0.1 10*3/uL (ref 0–0.1)
Eosinophils Absolute: 0.1 10*3/uL (ref 0–0.7)
Eosinophils Relative: 2 %
HEMATOCRIT: 33.5 % — AB (ref 40.0–52.0)
Hemoglobin: 11.4 g/dL — ABNORMAL LOW (ref 13.0–18.0)
Lymphocytes Relative: 34 %
Lymphs Abs: 2.6 10*3/uL (ref 1.0–3.6)
MCH: 30.6 pg (ref 26.0–34.0)
MCHC: 34 g/dL (ref 32.0–36.0)
MCV: 90 fL (ref 80.0–100.0)
MONO ABS: 0.5 10*3/uL (ref 0.2–1.0)
MONOS PCT: 6 %
NEUTROS ABS: 4.4 10*3/uL (ref 1.4–6.5)
Neutrophils Relative %: 57 %
Platelets: 259 10*3/uL (ref 150–440)
RBC: 3.73 MIL/uL — ABNORMAL LOW (ref 4.40–5.90)
RDW: 14.7 % — AB (ref 11.5–14.5)
WBC: 7.8 10*3/uL (ref 3.8–10.6)

## 2016-08-16 ENCOUNTER — Other Ambulatory Visit: Payer: Self-pay

## 2016-08-16 DIAGNOSIS — C61 Malignant neoplasm of prostate: Secondary | ICD-10-CM

## 2016-08-20 ENCOUNTER — Inpatient Hospital Stay: Payer: Medicare Other | Attending: Oncology

## 2016-08-20 DIAGNOSIS — C61 Malignant neoplasm of prostate: Secondary | ICD-10-CM

## 2016-08-20 LAB — CBC WITH DIFFERENTIAL/PLATELET
BASOS ABS: 0.1 10*3/uL (ref 0–0.1)
Basophils Relative: 1 %
Eosinophils Absolute: 0.2 10*3/uL (ref 0–0.7)
Eosinophils Relative: 2 %
HEMATOCRIT: 36.7 % — AB (ref 40.0–52.0)
HEMOGLOBIN: 12.5 g/dL — AB (ref 13.0–18.0)
LYMPHS PCT: 19 %
Lymphs Abs: 1.9 10*3/uL (ref 1.0–3.6)
MCH: 30.5 pg (ref 26.0–34.0)
MCHC: 34 g/dL (ref 32.0–36.0)
MCV: 89.8 fL (ref 80.0–100.0)
Monocytes Absolute: 0.6 10*3/uL (ref 0.2–1.0)
Monocytes Relative: 6 %
NEUTROS ABS: 7.5 10*3/uL — AB (ref 1.4–6.5)
NEUTROS PCT: 72 %
Platelets: 302 10*3/uL (ref 150–440)
RBC: 4.09 MIL/uL — AB (ref 4.40–5.90)
RDW: 14 % (ref 11.5–14.5)
WBC: 10.3 10*3/uL (ref 3.8–10.6)

## 2016-08-20 LAB — COMPREHENSIVE METABOLIC PANEL
ALBUMIN: 3.7 g/dL (ref 3.5–5.0)
ALK PHOS: 91 U/L (ref 38–126)
ALT: 16 U/L — AB (ref 17–63)
AST: 22 U/L (ref 15–41)
Anion gap: 10 (ref 5–15)
BILIRUBIN TOTAL: 0.5 mg/dL (ref 0.3–1.2)
BUN: 24 mg/dL — AB (ref 6–20)
CO2: 24 mmol/L (ref 22–32)
CREATININE: 1.25 mg/dL — AB (ref 0.61–1.24)
Calcium: 9.5 mg/dL (ref 8.9–10.3)
Chloride: 104 mmol/L (ref 101–111)
GFR calc Af Amer: >60 mL/min (ref >60)
GFR calc non Af Amer: 53 mL/min — ABNORMAL LOW (ref >60)
GLUCOSE: 113 mg/dL — AB (ref 65–99)
POTASSIUM: 5.1 mmol/L (ref 3.5–5.1)
Sodium: 138 mmol/L (ref 135–145)
TOTAL PROTEIN: 7.4 g/dL (ref 6.5–8.1)

## 2016-08-20 LAB — PSA: Prostatic Specific Antigen: 3.75 ng/mL (ref 0.00–4.00)

## 2016-11-04 NOTE — Progress Notes (Signed)
[place]  Telephone:(336) [phone]  Fax:(336) [place] DOB: February 16, 1933  MR#: [id number]  UMP#:[id number]  Patient Care Team: System, Pcp Not In as PCP - General   Patient seen on October 18, 2014.  CHIEF COMPLAINT: Prostate cancer with retroperitoneal adenopathy.  INTERVAL HISTORY:  Patient returns to clinic today for evaluation and laboratory work.  His only complaint today is of constipation.  He denies any bony pain. He has no neurologic complaints. He denies any recent fevers or illnesses. He has a good appetite and denies weight loss. He denies any chest pain or shortness of breath. He denies any nausea, vomiting, constipation, or diarrhea. He has no urinary complaints. Patient offers no further specific complaints today.   REVIEW OF SYSTEMS:   Review of Systems  Constitutional: Negative for diaphoresis, fever, malaise/fatigue and weight loss.  Respiratory: Negative for cough and shortness of breath.   Cardiovascular: Negative.  Negative for chest pain and leg swelling.  Gastrointestinal: Positive for constipation. Negative for abdominal pain.  Genitourinary: Negative.  Negative for flank pain and frequency.  Musculoskeletal: Negative.   Neurological: Negative for dizziness, sensory change and weakness.  Psychiatric/Behavioral: Negative.  The patient is not nervous/anxious.     As per HPI. Otherwise, a complete review of systems is negative.  ONCOLOGY HISTORY: Oncology History   1. prostate cancer, extra capsule extention, also illiac and retroperitoneal adenopathy on ct, small lesion liver possible metastatic, minimal uptake one area t spine. Asymptomatic. Not a surgical candidate with CT findings. Considdered not bulk and not visceral disease. Psa 13.6 prior to tx...casodex , then lupron first dose 12/31     Primary prostate cancer with metastasis from prostate to other site [facility])    PAST MEDICAL HISTORY: Past Medical History:    Diagnosis Date  . Cancer of prostate ([place]) 07/13/2014  . Diabetes mellitus without complication [facility])    Patient takes Metformin  . Hypertension     PAST SURGICAL HISTORY: No past surgical history on file.  FAMILY HISTORY: Reviewed and unchanged. No reported history of malignancy or chronic disease.  GYNECOLOGIC HISTORY:  No LMP for male patient.     ADVANCED DIRECTIVES:    HEALTH MAINTENANCE: Social History  Substance Use Topics  . Smoking status: Current Every Day Smoker  . Smokeless tobacco: Never Used  . Alcohol use 0.0 oz/week     No Known Allergies  Current Outpatient Prescriptions  Medication Sig Dispense Refill  . aspirin 325 MG EC tablet Take 325 mg by mouth daily.    . enzalutamide (XTANDI) 40 MG capsule Take 4 capsules (160 mg total) by mouth daily. 360 capsule 3  . fluvastatin (LESCOL) 20 MG capsule Take 20 mg by mouth at bedtime.    Marland Kitchen lisinopril-hydrochlorothiazide (PRINZIDE,ZESTORETIC) 20-12.5 MG tablet     . metFORMIN (GLUCOPHAGE) 500 MG tablet 500 mg daily at 6 (six) AM.     . Omega-3 Fatty Acids (FISH OIL) 1000 MG CAPS Take by mouth 4 (four) times daily.    . tamsulosin (FLOMAX) 0.4 MG CAPS capsule Take 0.4 mg by mouth 2 (two) times daily.     No current facility-administered medications for this visit.     OBJECTIVE: BP 122/77 (BP Location: Left Arm, Patient Position: Sitting)   Pulse 84   Temp 97.6 F (36.4 C) (Tympanic)   Resp 18   Wt 212 lb 14.4 oz (96.6 kg)   BMI 32.37 kg/m    Body mass index  is 32.37 kg/m.    ECOG FS:1 - Symptomatic but completely ambulatory  General: Well-developed, well-nourished, no acute distress. Eyes: Pink conjunctiva, anicteric sclera. HEENT: Normocephalic, moist mucous membranes, clear oropharnyx. Breasts: Gynecomastia, without lumps or masses. Lungs: Clear to auscultation bilaterally. Heart: Regular rate and rhythm. No rubs, murmurs, or gallops. Musculoskeletal: No edema, cyanosis, or clubbing. Occasional knee  and joint pain. Neuro: Alert, answering all questions appropriately. Cranial nerves grossly intact. Skin: No rashes or petechiae noted. Psych: Normal affect.   LAB RESULTS:  Appointment on 11/06/2016  Component Date Value Ref Range Status  . WBC 11/06/2016 11.1* 3.8 - 10.6 K/uL Final  . RBC 11/06/2016 3.97* 4.40 - 5.90 MIL/uL Final  . Hemoglobin 11/06/2016 11.9* 13.0 - 18.0 g/dL Final  . HCT 11/06/2016 35.7* 40.0 - 52.0 % Final  . MCV 11/06/2016 89.8  80.0 - 100.0 fL Final  . MCH 11/06/2016 29.9  26.0 - 34.0 pg Final  . MCHC 11/06/2016 33.3  32.0 - 36.0 g/dL Final  . RDW 11/06/2016 13.7  11.5 - 14.5 % Final  . Platelets 11/06/2016 322  150 - 440 K/uL Final  . Neutrophils Relative % 11/06/2016 78  % Final  . Neutro Abs 11/06/2016 8.8* 1.4 - 6.5 K/uL Final  . Lymphocytes Relative 11/06/2016 15  % Final  . Lymphs Abs 11/06/2016 1.7  1.0 - 3.6 K/uL Final  . Monocytes Relative 11/06/2016 5  % Final  . Monocytes Absolute 11/06/2016 0.5  0.2 - 1.0 K/uL Final  . Eosinophils Relative 11/06/2016 1  % Final  . Eosinophils Absolute 11/06/2016 0.1  0 - 0.7 K/uL Final  . Basophils Relative 11/06/2016 1  % Final  . Basophils Absolute 11/06/2016 0.1  0 - 0.1 K/uL Final  . Sodium 11/06/2016 135  135 - 145 mmol/L Final  . Potassium 11/06/2016 4.6  3.5 - 5.1 mmol/L Final  . Chloride 11/06/2016 103  101 - 111 mmol/L Final  . CO2 11/06/2016 23  22 - 32 mmol/L Final  . Glucose, Bld 11/06/2016 130* 65 - 99 mg/dL Final  . BUN 11/06/2016 25* 6 - 20 mg/dL Final  . Creatinine, Ser 11/06/2016 1.27* 0.61 - 1.24 mg/dL Final  . Calcium 11/06/2016 9.4  8.9 - 10.3 mg/dL Final  . Total Protein 11/06/2016 7.3  6.5 - 8.1 g/dL Final  . Albumin 11/06/2016 3.5  3.5 - 5.0 g/dL Final  . AST 11/06/2016 21  15 - 41 U/L Final  . ALT 11/06/2016 12* 17 - 63 U/L Final  . Alkaline Phosphatase 11/06/2016 90  38 - 126 U/L Final  . Total Bilirubin 11/06/2016 0.4  0.3 - 1.2 mg/dL Final  . GFR calc non Af Amer 11/06/2016 51*  >60 mL/min Final  . GFR calc Af Amer 11/06/2016 59* >60 mL/min Final   Comment: (NOTE) The eGFR has been calculated using the CKD EPI equation. This calculation has not been validated in all clinical situations. eGFR's persistently <60 mL/min signify possible Chronic Kidney Disease.   . Anion gap 11/06/2016 9  5 - 15 Final   Lab Results  Component Value Date   PSA 0.95 05/21/2016    STUDIES: No results found.  ASSESSMENT:  Prostate cancer with retroperitoneal adenopathy.  PLAN:     1. Prostate cancer with retroperitoneal adenopathy: PSA continues to trend up and is now 3.75, today's result is pending.  Patient likely has progressive disease and would likely need to change treatments in the near future.  He has been instructed to discontinue  Xtandi.  Will get a nuclear med bone scan as well as CT scans of the chest, abdomen, pelvis to assess for metastatic disease.  Patient will return to clinic in approximately 2 weeks to discuss the results and treatment planning.  2. Chronic renal insufficiency: Patient's creatinine appears to be at his baseline. 3.  Constipation: Continue OTC treatment at this time.  Patient states MiraLAX does not work and is going to take Dulcolax instead.  Approximately 30 minutes was spent in discussion of which greater than 50% was consultation.  Patient expressed understanding and was in agreement with this plan. He also understands that He can call clinic at any time with any questions, concerns, or complaints.    Lloyd Huger, MD   04/26/2015 4:21 PM

## 2016-11-05 ENCOUNTER — Other Ambulatory Visit: Payer: Self-pay | Admitting: *Deleted

## 2016-11-05 DIAGNOSIS — C61 Malignant neoplasm of prostate: Secondary | ICD-10-CM

## 2016-11-06 ENCOUNTER — Inpatient Hospital Stay (HOSPITAL_BASED_OUTPATIENT_CLINIC_OR_DEPARTMENT_OTHER): Payer: Medicare Other | Admitting: Oncology

## 2016-11-06 ENCOUNTER — Inpatient Hospital Stay: Payer: Medicare Other | Attending: Oncology

## 2016-11-06 VITALS — BP 122/77 | HR 84 | Temp 97.6°F | Resp 18 | Wt 212.9 lb

## 2016-11-06 DIAGNOSIS — Z79899 Other long term (current) drug therapy: Secondary | ICD-10-CM | POA: Diagnosis not present

## 2016-11-06 DIAGNOSIS — R599 Enlarged lymph nodes, unspecified: Secondary | ICD-10-CM | POA: Diagnosis not present

## 2016-11-06 DIAGNOSIS — C61 Malignant neoplasm of prostate: Secondary | ICD-10-CM | POA: Diagnosis not present

## 2016-11-06 DIAGNOSIS — Z7984 Long term (current) use of oral hypoglycemic drugs: Secondary | ICD-10-CM | POA: Diagnosis not present

## 2016-11-06 DIAGNOSIS — E119 Type 2 diabetes mellitus without complications: Secondary | ICD-10-CM | POA: Insufficient documentation

## 2016-11-06 DIAGNOSIS — N189 Chronic kidney disease, unspecified: Secondary | ICD-10-CM | POA: Insufficient documentation

## 2016-11-06 DIAGNOSIS — Z7982 Long term (current) use of aspirin: Secondary | ICD-10-CM | POA: Diagnosis not present

## 2016-11-06 DIAGNOSIS — K5900 Constipation, unspecified: Secondary | ICD-10-CM | POA: Diagnosis not present

## 2016-11-06 DIAGNOSIS — I129 Hypertensive chronic kidney disease with stage 1 through stage 4 chronic kidney disease, or unspecified chronic kidney disease: Secondary | ICD-10-CM | POA: Insufficient documentation

## 2016-11-06 DIAGNOSIS — F17210 Nicotine dependence, cigarettes, uncomplicated: Secondary | ICD-10-CM

## 2016-11-06 LAB — CBC WITH DIFFERENTIAL/PLATELET
Basophils Absolute: 0.1 10*3/uL (ref 0–0.1)
Basophils Relative: 1 %
EOS ABS: 0.1 10*3/uL (ref 0–0.7)
EOS PCT: 1 %
HCT: 35.7 % — ABNORMAL LOW (ref 40.0–52.0)
Hemoglobin: 11.9 g/dL — ABNORMAL LOW (ref 13.0–18.0)
LYMPHS ABS: 1.7 10*3/uL (ref 1.0–3.6)
LYMPHS PCT: 15 %
MCH: 29.9 pg (ref 26.0–34.0)
MCHC: 33.3 g/dL (ref 32.0–36.0)
MCV: 89.8 fL (ref 80.0–100.0)
MONOS PCT: 5 %
Monocytes Absolute: 0.5 10*3/uL (ref 0.2–1.0)
Neutro Abs: 8.8 10*3/uL — ABNORMAL HIGH (ref 1.4–6.5)
Neutrophils Relative %: 78 %
PLATELETS: 322 10*3/uL (ref 150–440)
RBC: 3.97 MIL/uL — AB (ref 4.40–5.90)
RDW: 13.7 % (ref 11.5–14.5)
WBC: 11.1 10*3/uL — ABNORMAL HIGH (ref 3.8–10.6)

## 2016-11-06 LAB — COMPREHENSIVE METABOLIC PANEL
ALK PHOS: 90 U/L (ref 38–126)
ALT: 12 U/L — AB (ref 17–63)
ANION GAP: 9 (ref 5–15)
AST: 21 U/L (ref 15–41)
Albumin: 3.5 g/dL (ref 3.5–5.0)
BUN: 25 mg/dL — ABNORMAL HIGH (ref 6–20)
CALCIUM: 9.4 mg/dL (ref 8.9–10.3)
CHLORIDE: 103 mmol/L (ref 101–111)
CO2: 23 mmol/L (ref 22–32)
CREATININE: 1.27 mg/dL — AB (ref 0.61–1.24)
GFR, EST AFRICAN AMERICAN: 59 mL/min — AB (ref >60)
GFR, EST NON AFRICAN AMERICAN: 51 mL/min — AB (ref >60)
Glucose, Bld: 130 mg/dL — ABNORMAL HIGH (ref 65–99)
Potassium: 4.6 mmol/L (ref 3.5–5.1)
SODIUM: 135 mmol/L (ref 135–145)
Total Bilirubin: 0.4 mg/dL (ref 0.3–1.2)
Total Protein: 7.3 g/dL (ref 6.5–8.1)

## 2016-11-06 LAB — PSA: PROSTATIC SPECIFIC ANTIGEN: 12.25 ng/mL — AB (ref 0.00–4.00)

## 2016-11-12 ENCOUNTER — Ambulatory Visit
Admission: RE | Admit: 2016-11-12 | Discharge: 2016-11-12 | Disposition: A | Discharge status: Home / Self Care | Payer: Medicare Other | Source: Ambulatory Visit | Attending: Oncology | Admitting: Oncology

## 2016-11-12 ENCOUNTER — Encounter
Admission: RE | Admit: 2016-11-12 | Discharge: 2016-11-12 | Disposition: A | Discharge status: Home / Self Care | Payer: Medicare Other | Source: Ambulatory Visit | Attending: Oncology | Admitting: Oncology

## 2016-11-12 DIAGNOSIS — N281 Cyst of kidney, acquired: Secondary | ICD-10-CM | POA: Diagnosis not present

## 2016-11-12 DIAGNOSIS — R911 Solitary pulmonary nodule: Secondary | ICD-10-CM | POA: Insufficient documentation

## 2016-11-12 DIAGNOSIS — K8020 Calculus of gallbladder without cholecystitis without obstruction: Secondary | ICD-10-CM | POA: Insufficient documentation

## 2016-11-12 DIAGNOSIS — I700 Atherosclerosis of aorta: Secondary | ICD-10-CM | POA: Diagnosis not present

## 2016-11-12 DIAGNOSIS — N1330 Unspecified hydronephrosis: Secondary | ICD-10-CM | POA: Insufficient documentation

## 2016-11-12 DIAGNOSIS — M5137 Other intervertebral disc degeneration, lumbosacral region: Secondary | ICD-10-CM | POA: Diagnosis not present

## 2016-11-12 DIAGNOSIS — I2510 Atherosclerotic heart disease of native coronary artery without angina pectoris: Secondary | ICD-10-CM | POA: Insufficient documentation

## 2016-11-12 DIAGNOSIS — C61 Malignant neoplasm of prostate: Secondary | ICD-10-CM | POA: Diagnosis not present

## 2016-11-12 MED ORDER — TECHNETIUM TC 99M MEDRONATE IV KIT
25.0000 | PACK | Freq: Once | INTRAVENOUS | Status: AC | PRN
  Administered 2016-11-12: 23.69 via INTRAVENOUS

## 2016-11-12 MED ORDER — IOPAMIDOL (ISOVUE-300) INJECTION 61%
100.0000 mL | Freq: Once | INTRAVENOUS | Status: AC | PRN
  Administered 2016-11-12: 100 mL via INTRAVENOUS

## 2016-11-13 NOTE — Progress Notes (Signed)
[place]  [phone]  Fax:([phone]) [place] DOB: [birth year]  MR#: [id number]  HXT#:[id number]  Patient Care Team: System, Pcp Not In as PCP - General   Patient seen on October 18, 2014.  CHIEF COMPLAINT: Prostate cancer with retroperitoneal adenopathy.  INTERVAL HISTORY:  Patient returns to clinic today for further evaluation, discussion of his imaging results, and treatment planning.  He currently feels well and is asymptomatic.  His only complaint is poor appetite. He denies any bony pain. He has no neurologic complaints. He denies any recent fevers or illnesses. He denies any chest pain or shortness of breath. He denies any nausea, vomiting, constipation, or diarrhea. He has no urinary complaints. Patient offers no further specific complaints today.   REVIEW OF SYSTEMS:   Review of Systems  Constitutional: Negative for diaphoresis, fever, malaise/fatigue and weight loss.  Respiratory: Negative for cough and shortness of breath.   Cardiovascular: Negative.  Negative for chest pain and leg swelling.  Gastrointestinal: Positive for constipation. Negative for abdominal pain.  Genitourinary: Negative.  Negative for flank pain and frequency.  Musculoskeletal: Negative.   Neurological: Negative for dizziness, sensory change and weakness.  Psychiatric/Behavioral: Negative.  The patient is not nervous/anxious.     As per HPI. Otherwise, a complete review of systems is negative.  ONCOLOGY HISTORY: Oncology History   1. prostate cancer, extra capsule extention, also illiac and retroperitoneal adenopathy on ct, small lesion liver possible metastatic, minimal uptake one area t spine. Asymptomatic. Not a surgical candidate with CT findings. Considdered not bulk and not visceral disease. Psa 13.6 prior to tx...casodex , then lupron first dose 12/31     Primary prostate cancer with metastasis from prostate to other site [facility])    PAST  MEDICAL HISTORY: Past Medical History:  Diagnosis Date  . Cancer of prostate ([place]) 07/13/2014  . Diabetes mellitus without complication [facility])    Patient takes Metformin  . Hypertension     PAST SURGICAL HISTORY: No past surgical history on file.  FAMILY HISTORY: Reviewed and unchanged. No reported history of malignancy or chronic disease.  GYNECOLOGIC HISTORY:  No LMP for male patient.     ADVANCED DIRECTIVES:    HEALTH MAINTENANCE: Social History   Tobacco Use  . Smoking status: Current Every Day Smoker  . Smokeless tobacco: Never Used  Substance Use Topics  . Alcohol use: Yes    Alcohol/week: 0.0 oz  . Drug use: No     No Known Allergies  Current Outpatient Medications  Medication Sig Dispense Refill  . aspirin 325 MG EC tablet Take 325 mg by mouth daily.    . enzalutamide (XTANDI) 40 MG capsule Take 4 capsules (160 mg total) by mouth daily. 360 capsule 3  . fluvastatin (LESCOL) 20 MG capsule Take 20 mg by mouth at bedtime.    Marland Kitchen lisinopril-hydrochlorothiazide (PRINZIDE,ZESTORETIC) 20-12.5 MG tablet     . metFORMIN (GLUCOPHAGE) 500 MG tablet 500 mg daily at 6 (six) AM.     . Omega-3 Fatty Acids (FISH OIL) 1000 MG CAPS Take by mouth 4 (four) times daily.    . tamsulosin (FLOMAX) 0.4 MG CAPS capsule Take 0.4 mg by mouth 2 (two) times daily.     No current facility-administered medications for this visit.     OBJECTIVE: BP 114/74 (BP Location: Left Arm, Patient Position: Sitting)   Pulse 68   Temp 97.9 F (36.6 C) (Tympanic)   Resp 18   Wt 210  lb 9.6 oz (95.5 kg)   SpO2 96%   BMI 32.02 kg/m    Body mass index is 32.02 kg/m.    ECOG FS:1 - Symptomatic but completely ambulatory  General: Well-developed, well-nourished, no acute distress. Eyes: Pink conjunctiva, anicteric sclera. HEENT: Normocephalic, moist mucous membranes, clear oropharnyx. Breasts: Gynecomastia, without lumps or masses. Lungs: Clear to auscultation bilaterally. Heart: Regular rate and  rhythm. No rubs, murmurs, or gallops. Musculoskeletal: No edema, cyanosis, or clubbing. Occasional knee and joint pain. Neuro: Alert, answering all questions appropriately. Cranial nerves grossly intact. Skin: No rashes or petechiae noted. Psych: Normal affect.   LAB RESULTS:  No visits with results within 3 Day(s) from this visit.  Latest known visit with results is:  Appointment on 11/06/2016  Component Date Value Ref Range Status  . WBC 11/06/2016 11.1* 3.8 - 10.6 K/uL Final  . RBC 11/06/2016 3.97* 4.40 - 5.90 MIL/uL Final  . Hemoglobin 11/06/2016 11.9* 13.0 - 18.0 g/dL Final  . HCT 11/06/2016 35.7* 40.0 - 52.0 % Final  . MCV 11/06/2016 89.8  80.0 - 100.0 fL Final  . MCH 11/06/2016 29.9  26.0 - 34.0 pg Final  . MCHC 11/06/2016 33.3  32.0 - 36.0 g/dL Final  . RDW 11/06/2016 13.7  11.5 - 14.5 % Final  . Platelets 11/06/2016 322  150 - 440 K/uL Final  . Neutrophils Relative % 11/06/2016 78  % Final  . Neutro Abs 11/06/2016 8.8* 1.4 - 6.5 K/uL Final  . Lymphocytes Relative 11/06/2016 15  % Final  . Lymphs Abs 11/06/2016 1.7  1.0 - 3.6 K/uL Final  . Monocytes Relative 11/06/2016 5  % Final  . Monocytes Absolute 11/06/2016 0.5  0.2 - 1.0 K/uL Final  . Eosinophils Relative 11/06/2016 1  % Final  . Eosinophils Absolute 11/06/2016 0.1  0 - 0.7 K/uL Final  . Basophils Relative 11/06/2016 1  % Final  . Basophils Absolute 11/06/2016 0.1  0 - 0.1 K/uL Final  . Sodium 11/06/2016 135  135 - 145 mmol/L Final  . Potassium 11/06/2016 4.6  3.5 - 5.1 mmol/L Final  . Chloride 11/06/2016 103  101 - 111 mmol/L Final  . CO2 11/06/2016 23  22 - 32 mmol/L Final  . Glucose, Bld 11/06/2016 130* 65 - 99 mg/dL Final  . BUN 11/06/2016 25* 6 - 20 mg/dL Final  . Creatinine, Ser 11/06/2016 1.27* 0.61 - 1.24 mg/dL Final  . Calcium 11/06/2016 9.4  8.9 - 10.3 mg/dL Final  . Total Protein 11/06/2016 7.3  6.5 - 8.1 g/dL Final  . Albumin 11/06/2016 3.5  3.5 - 5.0 g/dL Final  . AST 11/06/2016 21  15 - 41 U/L  Final  . ALT 11/06/2016 12* 17 - 63 U/L Final  . Alkaline Phosphatase 11/06/2016 90  38 - 126 U/L Final  . Total Bilirubin 11/06/2016 0.4  0.3 - 1.2 mg/dL Final  . GFR calc non Af Amer 11/06/2016 51* >60 mL/min Final  . GFR calc Af Amer 11/06/2016 59* >60 mL/min Final   Comment: (NOTE) The eGFR has been calculated using the CKD EPI equation. This calculation has not been validated in all clinical situations. eGFR's persistently <60 mL/min signify possible Chronic Kidney Disease.   . Anion gap 11/06/2016 9  5 - 15 Final  . Prostatic Specific Antigen 11/06/2016 12.25* 0.00 - 4.00 ng/mL Final   Comment: (NOTE) While PSA levels of <=4.0 ng/ml are reported as reference range, some men with levels below 4.0 ng/ml can have prostate cancer and  many men with PSA above 4.0 ng/ml do not have prostate cancer.  Other tests such as free PSA, age specific reference ranges, PSA velocity and PSA doubling time may be helpful especially in men less than 73 years old. Performed at [facility], [place] [address], [place], [place] [address]    Lab Results  Component Value Date   PSA 0.95 05/21/2016    STUDIES: Ct Chest W Contrast  Result Date: 11/12/2016 CLINICAL DATA:  Prostate cancer with extracapsular extension and pathologic adenopathy. Liver lesion, possibly metastatic. Restaging assessment. EXAM: CT CHEST, ABDOMEN, AND PELVIS WITH CONTRAST TECHNIQUE: Multidetector CT imaging of the chest, abdomen and pelvis was performed following the standard protocol during bolus administration of intravenous contrast. CONTRAST:  144m ISOVUE-300 IOPAMIDOL (ISOVUE-300) INJECTION 61% COMPARISON:  Multiple exams, including 07/20/2014 FINDINGS: CT CHEST FINDINGS Cardiovascular: Coronary, aortic arch, and branch vessel atherosclerotic vascular disease. Mediastinum/Nodes: Small subcarinal lymph nodes are present. No pathologic thoracic adenopathy. Lungs/Pleura: 0.5 by 0.4 cm left upper lobe nodule on image 72/4  posteriorly. Musculoskeletal: Abnormal sclerosis in the right eighth rib with questionable pathologic fracture on image 123/4. Subtle nearby sclerosis in the right seventh rib and right ninth rib very subtle sclerosis laterally in the left seventh rib. Thoracic spondylosis. CT ABDOMEN PELVIS FINDINGS Hepatobiliary: Multiple small gallstones layering dependently in the gallbladder. Otherwise unremarkable. Pancreas: Unremarkable Spleen: Unremarkable Adrenals/Urinary Tract: 3.3 by 3.6 by 3.3 cm enhancing partially exophytic mass in the right kidney lower pole favoring renal cell carcinoma. Bilateral renal cysts. Slightly non rotated right kidney. Adrenal glands normal. Moderate left hydronephrosis and hydroureter extending down she to a mass of the left UVJ region which appears contiguous with the prostate gland. The overall mass extending from the prostate measures approximately 4.3 by 2.6 by 3.0 cm. Appearance concerning for direct prostate cancer invasion of the left UVJ and resulting in obstruction. Bladder tumor is considered less likely. Stomach/Bowel: Unremarkable Vascular/Lymphatic: Aortoiliac atherosclerotic vascular disease. 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 Reproductive: As noted above, there is an enhancing mass extending from the base of the prostate gland into the left posterior urinary bladder and left UVJ, measuring about 4.3 by 2.6 by 3.0 cm. Other: No supplemental non-categorized findings. Musculoskeletal: Lipoma along the lateral margin of the right distal iliopsoas. There is degenerative disc disease and facet spurring causing impingement at L4-5 and L5-S1. The lesion on bone scan along the right anterior iliac crests is not have a well-defined CT correlate. The lesion in the left sacrum seen on bone scan does not have a well-defined CT correlate. The subtle lesion along the right side of the L5 vertebral body on the bone scan does not have an apparent CT correlate. IMPRESSION:  1. A locally invasive mass from the left prostate base extends up into the urinary bladder and appears to invade the left UVJ, causing moderate left hydronephrosis and hydroureter. 2. Right kidney lower pole mass measures 3.6 cm in diameter and probably represents a synchronous renal cell carcinoma. No tumor thrombus in the right renal vein. No associated periaortic adenopathy. 3. Abnormal sclerosis in the right eighth rib with possible small pathologic fracture. Subtle adjacent sclerotic lesions in the seventh and ninth ribs. Appearance suspicious for prostate metastatic disease. Other lesions seen on today's bone scan along the right iliac crest, left sacrum, and right side of the L5 vertebral body do not have a definite CT correlate. 4. Small left upper lobe pulmonary nodule at 5 by 4 mm, quite likely benign but  meriting surveillance given the patient's active prostate cancer. 5. Other imaging findings of potential clinical significance: Aortic Atherosclerosis (ICD10-I70.0). Coronary atherosclerosis. Cholelithiasis. Bilateral renal cysts. The lumbar spondylosis and degenerative disc disease cause impingement at L4-5 and L5-S1. Electronically Signed   By: Van Clines M.D.   On: 11/12/2016 15:20   Nm Bone Scan Whole Body  Result Date: 11/12/2016 CLINICAL DATA:  Metastatic prostate cancer EXAM: NUCLEAR MEDICINE WHOLE BODY BONE SCAN TECHNIQUE: Whole body anterior and posterior images were obtained approximately 3 hours after intravenous injection of radiopharmaceutical. RADIOPHARMACEUTICALS:  23.69 mCi Technetium-2mMDP IV COMPARISON:  12/15/2013 Radiographic correlation:  CT chest abdomen pelvis 11/12/2016 FINDINGS: Multiple foci of abnormal osseous tracer accumulation are identified. These include lower anterolateral RIGHT ribs, sacrum, LEFT SI joint, RIGHT iliac crest, and calvarium consistent with osseous metastasis. The sites at the sacrum, lower RIGHT ribs, LEFT SI joint, and RIGHT iliac bone are  new. Minimal degenerative type uptake laterally in the cervical and lumbar spine. No additional sites of concerning abnormal osseous tracer accumulation are identified. LEFT hydronephrosis and hydroureter new since prior study, also identified on CT. Remaining urinary tract and soft tissue distribution of tracer is unremarkable. IMPRESSION: New sites of abnormal osseous tracer accumulation identified in lower RIGHT ribs, sacrum, LEFT SI joint, and RIGHT iliac bone consistent with osseous metastases. LEFT hydronephrosis and hydroureter corresponding to distal LEFT ureteral obstruction identified on prior CT. Electronically Signed   By: MLavonia DanaM.D.   On: 11/12/2016 18:38   Ct Abdomen Pelvis W Contrast  Result Date: 11/12/2016 CLINICAL DATA:  Prostate cancer with extracapsular extension and pathologic adenopathy. Liver lesion, possibly metastatic. Restaging assessment. EXAM: CT CHEST, ABDOMEN, AND PELVIS WITH CONTRAST TECHNIQUE: Multidetector CT imaging of the chest, abdomen and pelvis was performed following the standard protocol during bolus administration of intravenous contrast. CONTRAST:  1062mISOVUE-300 IOPAMIDOL (ISOVUE-300) INJECTION 61% COMPARISON:  Multiple exams, including 07/20/2014 FINDINGS: CT CHEST FINDINGS Cardiovascular: Coronary, aortic arch, and branch vessel atherosclerotic vascular disease. Mediastinum/Nodes: Small subcarinal lymph nodes are present. No pathologic thoracic adenopathy. Lungs/Pleura: 0.5 by 0.4 cm left upper lobe nodule on image 72/4 posteriorly. Musculoskeletal: Abnormal sclerosis in the right eighth rib with questionable pathologic fracture on image 123/4. Subtle nearby sclerosis in the right seventh rib and right ninth rib very subtle sclerosis laterally in the left seventh rib. Thoracic spondylosis. CT ABDOMEN PELVIS FINDINGS Hepatobiliary: Multiple small gallstones layering dependently in the gallbladder. Otherwise unremarkable. Pancreas: Unremarkable Spleen:  Unremarkable Adrenals/Urinary Tract: 3.3 by 3.6 by 3.3 cm enhancing partially exophytic mass in the right kidney lower pole favoring renal cell carcinoma. Bilateral renal cysts. Slightly non rotated right kidney. Adrenal glands normal. Moderate left hydronephrosis and hydroureter extending down she to a mass of the left UVJ region which appears contiguous with the prostate gland. The overall mass extending from the prostate measures approximately 4.3 by 2.6 by 3.0 cm. Appearance concerning for direct prostate cancer invasion of the left UVJ and resulting in obstruction. Bladder tumor is considered less likely. Stomach/Bowel: Unremarkable Vascular/Lymphatic: Aortoiliac atherosclerotic vascular disease. 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 Reproductive: As noted above, there is an enhancing mass extending from the base of the prostate gland into the left posterior urinary bladder and left UVJ, measuring about 4.3 by 2.6 by 3.0 cm. Other: No supplemental non-categorized findings. Musculoskeletal: Lipoma along the lateral margin of the right distal iliopsoas. There is degenerative disc disease and facet spurring causing impingement at L4-5 and L5-S1. The lesion on bone scan along  the right anterior iliac crests is not have a well-defined CT correlate. The lesion in the left sacrum seen on bone scan does not have a well-defined CT correlate. The subtle lesion along the right side of the L5 vertebral body on the bone scan does not have an apparent CT correlate. IMPRESSION: 1. A locally invasive mass from the left prostate base extends up into the urinary bladder and appears to invade the left UVJ, causing moderate left hydronephrosis and hydroureter. 2. Right kidney lower pole mass measures 3.6 cm in diameter and probably represents a synchronous renal cell carcinoma. No tumor thrombus in the right renal vein. No associated periaortic adenopathy. 3. Abnormal sclerosis in the right eighth rib with possible  small pathologic fracture. Subtle adjacent sclerotic lesions in the seventh and ninth ribs. Appearance suspicious for prostate metastatic disease. Other lesions seen on today's bone scan along the right iliac crest, left sacrum, and right side of the L5 vertebral body do not have a definite CT correlate. 4. Small left upper lobe pulmonary nodule at 5 by 4 mm, quite likely benign but meriting surveillance given the patient's active prostate cancer. 5. Other imaging findings of potential clinical significance: Aortic Atherosclerosis (ICD10-I70.0). Coronary atherosclerosis. Cholelithiasis. Bilateral renal cysts. The lumbar spondylosis and degenerative disc disease cause impingement at L4-5 and L5-S1. Electronically Signed   By: Van Clines M.D.   On: 11/12/2016 15:20    ASSESSMENT:  Prostate cancer with retroperitoneal adenopathy.  PLAN:     1. Prostate cancer with retroperitoneal adenopathy: PSA continues to trend up and is now 12.25.  CT scans and nuclear medicine bone scan reviewed independently and reported as above with new widely spread metastatic disease.  After discussion with the patient is family, he is agreed to salvage chemotherapy using single agent Taxotere.  Patient may require on pro-Neulasta support with treatments, but will hold this for cycle 1 and reassess. Patient will also receive Zometa for his bony disease.  Plan on giving treatments every 3 weeks with free imaging after 4 cycles.  He will receive Zometa with odd-numbered treatments.  Return to clinic on November 21, 2016 to initiate cycle 1.   2. Chronic renal insufficiency: Patient's creatinine appears to be at his baseline. 3.  Constipation: Continue OTC treatment at this time.  Patient states MiraLAX does not work and is going to take Dulcolax instead.  Approximately 30 minutes was spent in discussion of which greater than 50% was consultation.  Patient expressed understanding and was in agreement with this plan. He also  understands that He can call clinic at any time with any questions, concerns, or complaints.    Lloyd Huger, MD   04/26/2015 11:42 AM

## 2016-11-14 ENCOUNTER — Inpatient Hospital Stay: Payer: Medicare Other | Attending: Oncology | Admitting: Oncology

## 2016-11-14 VITALS — BP 114/74 | HR 68 | Temp 97.9°F | Resp 18 | Wt 210.6 lb

## 2016-11-14 DIAGNOSIS — K5900 Constipation, unspecified: Secondary | ICD-10-CM | POA: Diagnosis not present

## 2016-11-14 DIAGNOSIS — K769 Liver disease, unspecified: Secondary | ICD-10-CM | POA: Diagnosis not present

## 2016-11-14 DIAGNOSIS — I10 Essential (primary) hypertension: Secondary | ICD-10-CM | POA: Insufficient documentation

## 2016-11-14 DIAGNOSIS — N2889 Other specified disorders of kidney and ureter: Secondary | ICD-10-CM | POA: Diagnosis not present

## 2016-11-14 DIAGNOSIS — Z7982 Long term (current) use of aspirin: Secondary | ICD-10-CM | POA: Insufficient documentation

## 2016-11-14 DIAGNOSIS — R630 Anorexia: Secondary | ICD-10-CM | POA: Diagnosis not present

## 2016-11-14 DIAGNOSIS — E119 Type 2 diabetes mellitus without complications: Secondary | ICD-10-CM | POA: Diagnosis not present

## 2016-11-14 DIAGNOSIS — N1330 Unspecified hydronephrosis: Secondary | ICD-10-CM

## 2016-11-14 DIAGNOSIS — M5137 Other intervertebral disc degeneration, lumbosacral region: Secondary | ICD-10-CM | POA: Insufficient documentation

## 2016-11-14 DIAGNOSIS — C61 Malignant neoplasm of prostate: Secondary | ICD-10-CM | POA: Insufficient documentation

## 2016-11-14 DIAGNOSIS — C786 Secondary malignant neoplasm of retroperitoneum and peritoneum: Secondary | ICD-10-CM | POA: Diagnosis not present

## 2016-11-14 DIAGNOSIS — Z79899 Other long term (current) drug therapy: Secondary | ICD-10-CM | POA: Insufficient documentation

## 2016-11-14 DIAGNOSIS — Z5111 Encounter for antineoplastic chemotherapy: Secondary | ICD-10-CM | POA: Insufficient documentation

## 2016-11-14 DIAGNOSIS — Z7984 Long term (current) use of oral hypoglycemic drugs: Secondary | ICD-10-CM | POA: Diagnosis not present

## 2016-11-14 DIAGNOSIS — F17210 Nicotine dependence, cigarettes, uncomplicated: Secondary | ICD-10-CM | POA: Diagnosis not present

## 2016-11-14 DIAGNOSIS — K8020 Calculus of gallbladder without cholecystitis without obstruction: Secondary | ICD-10-CM | POA: Insufficient documentation

## 2016-11-14 NOTE — Progress Notes (Signed)
Patient is here for follow up, he has no appetite, he has about 2 bowel movements a week. Here for results.

## 2016-11-15 MED ORDER — MEGESTROL ACETATE 40 MG PO TABS: 40.0000 mg | ORAL_TABLET | Freq: Every day | ORAL | 1 refills | Status: DC

## 2016-11-15 MED ORDER — PROCHLORPERAZINE MALEATE 10 MG PO TABS: 10.0000 mg | ORAL_TABLET | Freq: Four times a day (QID) | ORAL | 2 refills | Status: DC | PRN

## 2016-11-15 MED ORDER — ONDANSETRON HCL 8 MG PO TABS: 8.0000 mg | ORAL_TABLET | Freq: Two times a day (BID) | ORAL | 2 refills | Status: DC | PRN

## 2016-11-15 MED ORDER — LIDOCAINE-PRILOCAINE 2.5-2.5 % EX CREA: TOPICAL_CREAM | CUTANEOUS | 3 refills | Status: DC

## 2016-11-15 NOTE — Addendum Note (Signed)
Addended by: Luretha Murphy on: 11/15/2016 03:50 PM   Modules accepted: Orders

## 2016-11-15 NOTE — Progress Notes (Signed)
START OFF PATHWAY REGIMEN - Prostate   [id number]:Docetaxel 75 mg/m2:   A cycle is every 21 days:     Docetaxel   **Always confirm dose/schedule in your pharmacy ordering system**    Patient Characteristics: Adenocarcinoma, Metastatic, Castration Resistant, Asymptomatic/Minimally Symptomatic, Second Line Current radiographic evidence of distant metastasis<= Yes Histology: Adenocarcinoma AJCC T Category: cTX Gleason Primary: 4 AJCC N Category: NX Gleason Secondary: 5 AJCC M Category: M1c Gleason Score: 9 AJCC 8 Stage Grouping: IVB PSA Values (ng/mL): ? 10 < 20 Would you be surprised if this patient died  in the next year<= I would be surprised if this patient died in the next year Line of Therapy: Second Line Intent of Therapy: Non-Curative / Palliative Intent, Discussed with Patient

## 2016-11-18 NOTE — Patient Instructions (Signed)
Docetaxel injection What is this medicine? DOCETAXEL (doe se TAX el) is a chemotherapy drug. It targets fast dividing cells, like cancer cells, and causes these cells to die. This medicine is used to treat many types of cancers like breast cancer, certain stomach cancers, head and neck cancer, lung cancer, and prostate cancer. This medicine may be used for other purposes; ask your health care provider or pharmacist if you have questions. COMMON BRAND NAME(S): Docefrez, Taxotere What should I tell my health care provider before I take this medicine? They need to know if you have any of these conditions: -infection (especially a virus infection such as chickenpox, cold sores, or herpes) -liver disease -low blood counts, like low white cell, platelet, or red cell counts -an unusual or allergic reaction to docetaxel, polysorbate 80, other chemotherapy agents, other medicines, foods, dyes, or preservatives -pregnant or trying to get pregnant -breast-feeding How should I use this medicine? This drug is given as an infusion into a vein. It is administered in a hospital or clinic by a specially trained health care professional. Talk to your pediatrician regarding the use of this medicine in children. Special care may be needed. Overdosage: If you think you have taken too much of this medicine contact a poison control center or emergency room at once. NOTE: This medicine is only for you. Do not share this medicine with others. What if I miss a dose? It is important not to miss your dose. Call your doctor or health care professional if you are unable to keep an appointment. What may interact with this medicine? -cyclosporine -erythromycin -ketoconazole -medicines to increase blood counts like filgrastim, pegfilgrastim, sargramostim -vaccines Talk to your doctor or health care professional before taking any of these medicines: -acetaminophen -aspirin -ibuprofen -ketoprofen -naproxen This list  may not describe all possible interactions. Give your health care provider a list of all the medicines, herbs, non-prescription drugs, or dietary supplements you use. Also tell them if you smoke, drink alcohol, or use illegal drugs. Some items may interact with your medicine. What should I watch for while using this medicine? Your condition will be monitored carefully while you are receiving this medicine. You will need important blood work done while you are taking this medicine. This drug may make you feel generally unwell. This is not uncommon, as chemotherapy can affect healthy cells as well as cancer cells. Report any side effects. Continue your course of treatment even though you feel ill unless your doctor tells you to stop. In some cases, you may be given additional medicines to help with side effects. Follow all directions for their use. Call your doctor or health care professional for advice if you get a fever, chills or sore throat, or other symptoms of a cold or flu. Do not treat yourself. This drug decreases your body's ability to fight infections. Try to avoid being around people who are sick. This medicine may increase your risk to bruise or bleed. Call your doctor or health care professional if you notice any unusual bleeding. This medicine may contain alcohol in the product. You may get drowsy or dizzy. Do not drive, use machinery, or do anything that needs mental alertness until you know how this medicine affects you. Do not stand or sit up quickly, especially if you are an older patient. This reduces the risk of dizzy or fainting spells. Avoid alcoholic drinks. Do not become pregnant while taking this medicine. Women should inform their doctor if they wish to become pregnant or   think they might be pregnant. There is a potential for serious side effects to an unborn child. Talk to your health care professional or pharmacist for more information. Do not breast-feed an infant while taking  this medicine. What side effects may I notice from receiving this medicine? Side effects that you should report to your doctor or health care professional as soon as possible: -allergic reactions like skin rash, itching or hives, swelling of the face, lips, or tongue -low blood counts - This drug may decrease the number of white blood cells, red blood cells and platelets. You may be at increased risk for infections and bleeding. -signs of infection - fever or chills, cough, sore throat, pain or difficulty passing urine -signs of decreased platelets or bleeding - bruising, pinpoint red spots on the skin, black, tarry stools, nosebleeds -signs of decreased red blood cells - unusually weak or tired, fainting spells, lightheadedness -breathing problems -fast or irregular heartbeat -low blood pressure -mouth sores -nausea and vomiting -pain, swelling, redness or irritation at the injection site -pain, tingling, numbness in the hands or feet -swelling of the ankle, feet, hands -weight gain Side effects that usually do not require medical attention (report to your doctor or health care professional if they continue or are bothersome): -bone pain -complete hair loss including hair on your head, underarms, pubic hair, eyebrows, and eyelashes -diarrhea -excessive tearing -changes in the color of fingernails -loosening of the fingernails -nausea -muscle pain -red flush to skin -sweating -weak or tired This list may not describe all possible side effects. Call your doctor for medical advice about side effects. You may report side effects to FDA at 1-800-FDA-1088. Where should I keep my medicine? This drug is given in a hospital or clinic and will not be stored at home. NOTE: This sheet is a summary. It may not cover all possible information. If you have questions about this medicine, talk to your doctor, pharmacist, or health care provider.  2018 Elsevier/Gold Standard (2015-01-26  12:32:56) Zoledronic Acid injection (Hypercalcemia, Oncology) What is this medicine? ZOLEDRONIC ACID (ZOE le dron ik AS id) lowers the amount of calcium loss from bone. It is used to treat too much calcium in your blood from cancer. It is also used to prevent complications of cancer that has spread to the bone. This medicine may be used for other purposes; ask your health care provider or pharmacist if you have questions. COMMON BRAND NAME(S): Zometa What should I tell my health care provider before I take this medicine? They need to know if you have any of these conditions: -aspirin-sensitive asthma -cancer, especially if you are receiving medicines used to treat cancer -dental disease or wear dentures -infection -kidney disease -receiving corticosteroids like dexamethasone or prednisone -an unusual or allergic reaction to zoledronic acid, other medicines, foods, dyes, or preservatives -pregnant or trying to get pregnant -breast-feeding How should I use this medicine? This medicine is for infusion into a vein. It is given by a health care professional in a hospital or clinic setting. Talk to your pediatrician regarding the use of this medicine in children. Special care may be needed. Overdosage: If you think you have taken too much of this medicine contact a poison control center or emergency room at once. NOTE: This medicine is only for you. Do not share this medicine with others. What if I miss a dose? It is important not to miss your dose. Call your doctor or health care professional if you are unable to keep an  appointment. What may interact with this medicine? -certain antibiotics given by injection -NSAIDs, medicines for pain and inflammation, like ibuprofen or naproxen -some diuretics like bumetanide, furosemide -teriparatide -thalidomide This list may not describe all possible interactions. Give your health care provider a list of all the medicines, herbs, non-prescription  drugs, or dietary supplements you use. Also tell them if you smoke, drink alcohol, or use illegal drugs. Some items may interact with your medicine. What should I watch for while using this medicine? Visit your doctor or health care professional for regular checkups. It may be some time before you see the benefit from this medicine. Do not stop taking your medicine unless your doctor tells you to. Your doctor may order blood tests or other tests to see how you are doing. Women should inform their doctor if they wish to become pregnant or think they might be pregnant. There is a potential for serious side effects to an unborn child. Talk to your health care professional or pharmacist for more information. You should make sure that you get enough calcium and vitamin D while you are taking this medicine. Discuss the foods you eat and the vitamins you take with your health care professional. Some people who take this medicine have severe bone, joint, and/or muscle pain. This medicine may also increase your risk for jaw problems or a broken thigh bone. Tell your doctor right away if you have severe pain in your jaw, bones, joints, or muscles. Tell your doctor if you have any pain that does not go away or that gets worse. Tell your dentist and dental surgeon that you are taking this medicine. You should not have major dental surgery while on this medicine. See your dentist to have a dental exam and fix any dental problems before starting this medicine. Take good care of your teeth while on this medicine. Make sure you see your dentist for regular follow-up appointments. What side effects may I notice from receiving this medicine? Side effects that you should report to your doctor or health care professional as soon as possible: -allergic reactions like skin rash, itching or hives, swelling of the face, lips, or tongue -anxiety, confusion, or depression -breathing problems -changes in vision -eye  pain -feeling faint or lightheaded, falls -jaw pain, especially after dental work -mouth sores -muscle cramps, stiffness, or weakness -redness, blistering, peeling or loosening of the skin, including inside the mouth -trouble passing urine or change in the amount of urine Side effects that usually do not require medical attention (report to your doctor or health care professional if they continue or are bothersome): -bone, joint, or muscle pain -constipation -diarrhea -fever -hair loss -irritation at site where injected -loss of appetite -nausea, vomiting -stomach upset -trouble sleeping -trouble swallowing -weak or tired This list may not describe all possible side effects. Call your doctor for medical advice about side effects. You may report side effects to FDA at 1-800-FDA-1088. Where should I keep my medicine? This drug is given in a hospital or clinic and will not be stored at home. NOTE: This sheet is a summary. It may not cover all possible information. If you have questions about this medicine, talk to your doctor, pharmacist, or health care provider.  2018 Elsevier/Gold Standard (2013-05-22 14:19:39)

## 2016-11-20 ENCOUNTER — Inpatient Hospital Stay: Payer: Medicare Other

## 2016-11-20 NOTE — Progress Notes (Signed)
[facility]  Telephone:([phone]) [phone]  Fax:([phone]) [place] DOB: 07/23/1933  MR#: [id number]  YIA#:[id number]  Patient Care Team: System, Pcp Not In as PCP - General   Patient seen on October 18, 2014.  CHIEF COMPLAINT: Prostate cancer with retroperitoneal adenopathy.  INTERVAL HISTORY:  Patient returns to clinic today for further evaluation and initiation of Taxotere.  He continues to feel well and is asymptomatic.  He continues to have a poor appetite.  He denies any bony pain. He has no neurologic complaints. He denies any recent fevers or illnesses. He denies any chest pain or shortness of breath. He denies any nausea, vomiting, constipation, or diarrhea. He has no urinary complaints. Patient offers no further specific complaints today.   REVIEW OF SYSTEMS:   Review of Systems  Constitutional: Negative.  Negative for diaphoresis, fever, malaise/fatigue and weight loss.  Respiratory: Negative.  Negative for cough and shortness of breath.   Cardiovascular: Negative.  Negative for chest pain and leg swelling.  Gastrointestinal: Negative.  Negative for abdominal pain and constipation.  Genitourinary: Negative.  Negative for flank pain and frequency.  Musculoskeletal: Negative.   Skin: Negative.  Negative for rash.  Neurological: Negative.  Negative for dizziness, sensory change and weakness.  Psychiatric/Behavioral: Negative.  The patient is not nervous/anxious.     As per HPI. Otherwise, a complete review of systems is negative.  ONCOLOGY HISTORY: Oncology History   1. prostate cancer, extra capsule extention, also illiac and retroperitoneal adenopathy on ct, small lesion liver possible metastatic, minimal uptake one area t spine. Asymptomatic. Not a surgical candidate with CT findings. Considdered not bulk and not visceral disease. Psa 13.6 prior to tx...casodex , then lupron first dose 12/31     Primary prostate cancer with metastasis from  prostate to other site [facility])    PAST MEDICAL HISTORY: Past Medical History:  Diagnosis Date  . Cancer of prostate ([place]) 07/13/2014  . Diabetes mellitus without complication [facility])    Patient takes Metformin  . Hypertension     PAST SURGICAL HISTORY: History reviewed. No pertinent surgical history.  FAMILY HISTORY: Reviewed and unchanged. No reported history of malignancy or chronic disease.    ADVANCED DIRECTIVES:    HEALTH MAINTENANCE: Social History   Tobacco Use  . Smoking status: Current Every Day Smoker  . Smokeless tobacco: Never Used  Substance Use Topics  . Alcohol use: Yes    Alcohol/week: 0.0 oz  . Drug use: No     No Known Allergies  Current Outpatient Medications  Medication Sig Dispense Refill  . aspirin 325 MG EC tablet Take 325 mg by mouth daily.    . fluvastatin (LESCOL) 20 MG capsule Take 20 mg by mouth at bedtime.    . lidocaine-prilocaine (EMLA) cream Apply to affected area once 30 g 3  . megestrol (MEGACE) 40 MG tablet Take 1 tablet (40 mg total) daily by mouth. 30 tablet 1  . metFORMIN (GLUCOPHAGE) 500 MG tablet 500 mg daily at 6 (six) AM.     . Omega-3 Fatty Acids (FISH OIL) 1000 MG CAPS Take by mouth 4 (four) times daily.    . tamsulosin (FLOMAX) 0.4 MG CAPS capsule Take 0.4 mg by mouth 2 (two) times daily.    . enzalutamide (XTANDI) 40 MG capsule Take 4 capsules (160 mg total) by mouth daily. (Patient not taking: Reported on 11/21/2016) 360 capsule 3  . lisinopril-hydrochlorothiazide (PRINZIDE,ZESTORETIC) 20-12.5 MG tablet     .  ondansetron (ZOFRAN) 8 MG tablet Take 1 tablet (8 mg total) 2 (two) times daily as needed by mouth for refractory nausea / vomiting. (Patient not taking: Reported on 11/21/2016) 30 tablet 2  . prochlorperazine (COMPAZINE) 10 MG tablet Take 1 tablet (10 mg total) every 6 (six) hours as needed by mouth (Nausea or vomiting). (Patient not taking: Reported on 11/21/2016) 60 tablet 2   No current facility-administered  medications for this visit.    Facility-Administered Medications Ordered in Other Visits  Medication Dose Route Frequency Provider Last Rate Last Dose  . DOCEtaxel (TAXOTERE) 160 mg in dextrose 5 % 250 mL chemo infusion  75 mg/m2 (Order-Specific) Intravenous Once Lloyd Huger, MD        OBJECTIVE: BP 123/80   Pulse 73   Temp (!) 95.4 F (35.2 C) (Tympanic)   Resp 20   Wt 211 lb 12.8 oz (96.1 kg)   BMI 32.20 kg/m    Body mass index is 32.2 kg/m.    ECOG FS:0 - Asymptomatic  General: Well-developed, well-nourished, no acute distress. Eyes: Pink conjunctiva, anicteric sclera. HEENT: Normocephalic, moist mucous membranes, clear oropharnyx. Breasts: Gynecomastia, without lumps or masses. Lungs: Clear to auscultation bilaterally. Heart: Regular rate and rhythm. No rubs, murmurs, or gallops. Musculoskeletal: No edema, cyanosis, or clubbing. Occasional knee and joint pain. Neuro: Alert, answering all questions appropriately. Cranial nerves grossly intact. Skin: No rashes or petechiae noted. Psych: Normal affect.   LAB RESULTS:  Appointment on 11/21/2016  Component Date Value Ref Range Status  . Sodium 11/21/2016 136  135 - 145 mmol/L Final  . Potassium 11/21/2016 4.7  3.5 - 5.1 mmol/L Final  . Chloride 11/21/2016 105  101 - 111 mmol/L Final  . CO2 11/21/2016 23  22 - 32 mmol/L Final  . Glucose, Bld 11/21/2016 127* 65 - 99 mg/dL Final  . BUN 11/21/2016 24* 6 - 20 mg/dL Final  . Creatinine, Ser 11/21/2016 1.39* 0.61 - 1.24 mg/dL Final  . Calcium 11/21/2016 9.0  8.9 - 10.3 mg/dL Final  . Total Protein 11/21/2016 7.2  6.5 - 8.1 g/dL Final  . Albumin 11/21/2016 3.4* 3.5 - 5.0 g/dL Final  . AST 11/21/2016 17  15 - 41 U/L Final  . ALT 11/21/2016 11* 17 - 63 U/L Final  . Alkaline Phosphatase 11/21/2016 83  38 - 126 U/L Final  . Total Bilirubin 11/21/2016 0.4  0.3 - 1.2 mg/dL Final  . GFR calc non Af Amer 11/21/2016 46* >60 mL/min Final  . GFR calc Af Amer 11/21/2016 53* >60  mL/min Final   Comment: (NOTE) The eGFR has been calculated using the CKD EPI equation. This calculation has not been validated in all clinical situations. eGFR's persistently <60 mL/min signify possible Chronic Kidney Disease.   . Anion gap 11/21/2016 8  5 - 15 Final  . WBC 11/21/2016 8.4  3.8 - 10.6 K/uL Final  . RBC 11/21/2016 3.79* 4.40 - 5.90 MIL/uL Final  . Hemoglobin 11/21/2016 11.4* 13.0 - 18.0 g/dL Final  . HCT 11/21/2016 34.1* 40.0 - 52.0 % Final  . MCV 11/21/2016 89.9  80.0 - 100.0 fL Final  . MCH 11/21/2016 30.2  26.0 - 34.0 pg Final  . MCHC 11/21/2016 33.5  32.0 - 36.0 g/dL Final  . RDW 11/21/2016 13.9  11.5 - 14.5 % Final  . Platelets 11/21/2016 296  150 - 440 K/uL Final  . Neutrophils Relative % 11/21/2016 68  % Final  . Neutro Abs 11/21/2016 5.8  1.4 - 6.5  K/uL Final  . Lymphocytes Relative 11/21/2016 21  % Final  . Lymphs Abs 11/21/2016 1.8  1.0 - 3.6 K/uL Final  . Monocytes Relative 11/21/2016 9  % Final  . Monocytes Absolute 11/21/2016 0.7  0.2 - 1.0 K/uL Final  . Eosinophils Relative 11/21/2016 1  % Final  . Eosinophils Absolute 11/21/2016 0.1  0 - 0.7 K/uL Final  . Basophils Relative 11/21/2016 1  % Final  . Basophils Absolute 11/21/2016 0.1  0 - 0.1 K/uL Final   Lab Results  Component Value Date   PSA 0.95 05/21/2016    STUDIES: No results found.  ASSESSMENT:  Prostate cancer with retroperitoneal adenopathy.  PLAN:     1. Prostate cancer with retroperitoneal adenopathy: PSA continues to trend up and is now 12.25.  CT scans and nuclear medicine bone scan reviewed independently with new widely spread metastatic disease.  After discussion with the patient is family, he has agreed to salvage chemotherapy using single agent Taxotere.  Patient may require OnProNeulasta support with treatments, but will hold this for cycle 1 and reassess. Patient will also receive Zometa on the odd-numbered cycles for his bony disease.  Plan on giving treatments every 3 weeks  with repeat imaging after 4 cycles.  Return to clinic in 1 week for laboratory work and then in 3 weeks for further evaluation and consideration of cycle 2.     2. Chronic renal insufficiency: Patient's creatinine appears to be at his baseline. 3.  Constipation: Continue OTC treatment at this time.  Patient states MiraLAX does not work and is going to take Dulcolax instead.  Approximately 30 minutes was spent in discussion of which greater than 50% was consultation.  Patient expressed understanding and was in agreement with this plan. He also understands that He can call clinic at any time with any questions, concerns, or complaints.    Lloyd Huger, MD   04/26/2015 10:34 AM

## 2016-11-21 ENCOUNTER — Encounter: Payer: Self-pay | Admitting: Oncology

## 2016-11-21 ENCOUNTER — Other Ambulatory Visit: Payer: Medicare Other

## 2016-11-21 ENCOUNTER — Other Ambulatory Visit: Payer: Self-pay

## 2016-11-21 ENCOUNTER — Inpatient Hospital Stay: Payer: Medicare Other

## 2016-11-21 ENCOUNTER — Inpatient Hospital Stay (HOSPITAL_BASED_OUTPATIENT_CLINIC_OR_DEPARTMENT_OTHER): Payer: Medicare Other | Admitting: Oncology

## 2016-11-21 ENCOUNTER — Ambulatory Visit: Payer: Medicare Other | Admitting: Oncology

## 2016-11-21 VITALS — BP 123/80 | HR 73 | Temp 95.4°F | Resp 20 | Wt 211.8 lb

## 2016-11-21 DIAGNOSIS — R630 Anorexia: Secondary | ICD-10-CM | POA: Diagnosis not present

## 2016-11-21 DIAGNOSIS — K8020 Calculus of gallbladder without cholecystitis without obstruction: Secondary | ICD-10-CM

## 2016-11-21 DIAGNOSIS — C786 Secondary malignant neoplasm of retroperitoneum and peritoneum: Secondary | ICD-10-CM

## 2016-11-21 DIAGNOSIS — C61 Malignant neoplasm of prostate: Secondary | ICD-10-CM | POA: Diagnosis not present

## 2016-11-21 DIAGNOSIS — N1330 Unspecified hydronephrosis: Secondary | ICD-10-CM

## 2016-11-21 DIAGNOSIS — F17210 Nicotine dependence, cigarettes, uncomplicated: Secondary | ICD-10-CM

## 2016-11-21 DIAGNOSIS — Z79899 Other long term (current) drug therapy: Secondary | ICD-10-CM | POA: Diagnosis not present

## 2016-11-21 DIAGNOSIS — I10 Essential (primary) hypertension: Secondary | ICD-10-CM

## 2016-11-21 DIAGNOSIS — Z7984 Long term (current) use of oral hypoglycemic drugs: Secondary | ICD-10-CM

## 2016-11-21 DIAGNOSIS — K769 Liver disease, unspecified: Secondary | ICD-10-CM | POA: Diagnosis not present

## 2016-11-21 DIAGNOSIS — E119 Type 2 diabetes mellitus without complications: Secondary | ICD-10-CM | POA: Diagnosis not present

## 2016-11-21 DIAGNOSIS — N2889 Other specified disorders of kidney and ureter: Secondary | ICD-10-CM | POA: Diagnosis not present

## 2016-11-21 DIAGNOSIS — K5900 Constipation, unspecified: Secondary | ICD-10-CM | POA: Diagnosis not present

## 2016-11-21 DIAGNOSIS — M5137 Other intervertebral disc degeneration, lumbosacral region: Secondary | ICD-10-CM

## 2016-11-21 DIAGNOSIS — Z7982 Long term (current) use of aspirin: Secondary | ICD-10-CM

## 2016-11-21 DIAGNOSIS — Z5111 Encounter for antineoplastic chemotherapy: Secondary | ICD-10-CM | POA: Diagnosis not present

## 2016-11-21 LAB — CBC WITH DIFFERENTIAL/PLATELET
BASOS ABS: 0.1 10*3/uL (ref 0–0.1)
BASOS PCT: 1 %
Eosinophils Absolute: 0.1 10*3/uL (ref 0–0.7)
Eosinophils Relative: 1 %
HEMATOCRIT: 34.1 % — AB (ref 40.0–52.0)
HEMOGLOBIN: 11.4 g/dL — AB (ref 13.0–18.0)
LYMPHS PCT: 21 %
Lymphs Abs: 1.8 10*3/uL (ref 1.0–3.6)
MCH: 30.2 pg (ref 26.0–34.0)
MCHC: 33.5 g/dL (ref 32.0–36.0)
MCV: 89.9 fL (ref 80.0–100.0)
MONO ABS: 0.7 10*3/uL (ref 0.2–1.0)
Monocytes Relative: 9 %
NEUTROS ABS: 5.8 10*3/uL (ref 1.4–6.5)
NEUTROS PCT: 68 %
Platelets: 296 10*3/uL (ref 150–440)
RBC: 3.79 MIL/uL — AB (ref 4.40–5.90)
RDW: 13.9 % (ref 11.5–14.5)
WBC: 8.4 10*3/uL (ref 3.8–10.6)

## 2016-11-21 LAB — COMPREHENSIVE METABOLIC PANEL
ALBUMIN: 3.4 g/dL — AB (ref 3.5–5.0)
ALK PHOS: 83 U/L (ref 38–126)
ALT: 11 U/L — ABNORMAL LOW (ref 17–63)
ANION GAP: 8 (ref 5–15)
AST: 17 U/L (ref 15–41)
BILIRUBIN TOTAL: 0.4 mg/dL (ref 0.3–1.2)
BUN: 24 mg/dL — ABNORMAL HIGH (ref 6–20)
CALCIUM: 9 mg/dL (ref 8.9–10.3)
CO2: 23 mmol/L (ref 22–32)
Chloride: 105 mmol/L (ref 101–111)
Creatinine, Ser: 1.39 mg/dL — ABNORMAL HIGH (ref 0.61–1.24)
GFR, EST AFRICAN AMERICAN: 53 mL/min — AB (ref >60)
GFR, EST NON AFRICAN AMERICAN: 46 mL/min — AB (ref >60)
GLUCOSE: 127 mg/dL — AB (ref 65–99)
POTASSIUM: 4.7 mmol/L (ref 3.5–5.1)
Sodium: 136 mmol/L (ref 135–145)
TOTAL PROTEIN: 7.2 g/dL (ref 6.5–8.1)

## 2016-11-21 LAB — PSA: Prostatic Specific Antigen: 16.21 ng/mL — ABNORMAL HIGH (ref 0.00–4.00)

## 2016-11-21 MED ORDER — DOCETAXEL CHEMO INJECTION 160 MG/16ML
75.0000 mg/m2 | Freq: Once | INTRAVENOUS | Status: AC
  Administered 2016-11-21: 160 mg via INTRAVENOUS
  Filled 2016-11-21: qty 16

## 2016-11-21 MED ORDER — DEXAMETHASONE SODIUM PHOSPHATE 10 MG/ML IJ SOLN
10.0000 mg | Freq: Once | INTRAMUSCULAR | Status: AC
  Administered 2016-11-21: 10 mg via INTRAVENOUS
  Filled 2016-11-21: qty 1

## 2016-11-21 MED ORDER — SODIUM CHLORIDE 0.9 % IV SOLN: 10.0000 mg | Freq: Once | INTRAVENOUS | Status: DC

## 2016-11-21 MED ORDER — ZOLEDRONIC ACID 4 MG/5ML IV CONC
3.5000 mg | Freq: Once | INTRAVENOUS | Status: AC
  Administered 2016-11-21: 3.5 mg via INTRAVENOUS
  Filled 2016-11-21: qty 4.38

## 2016-11-21 MED ORDER — SODIUM CHLORIDE 0.9 % IV SOLN
Freq: Once | INTRAVENOUS | Status: AC
  Administered 2016-11-21: 10:00:00 via INTRAVENOUS
  Filled 2016-11-21: qty 1000

## 2016-11-21 NOTE — Progress Notes (Signed)
Patient here today for follow up and initial treatment regarding prostate cancer.

## 2016-11-27 ENCOUNTER — Inpatient Hospital Stay: Payer: Medicare Other

## 2016-11-27 DIAGNOSIS — C61 Malignant neoplasm of prostate: Secondary | ICD-10-CM

## 2016-11-27 DIAGNOSIS — Z5111 Encounter for antineoplastic chemotherapy: Secondary | ICD-10-CM | POA: Diagnosis not present

## 2016-11-27 LAB — CBC WITH DIFFERENTIAL/PLATELET
BASOS ABS: 0 10*3/uL (ref 0–0.1)
Basophils Relative: 1 %
Eosinophils Absolute: 0 10*3/uL (ref 0–0.7)
Eosinophils Relative: 1 %
HEMATOCRIT: 30.6 % — AB (ref 40.0–52.0)
HEMOGLOBIN: 10.2 g/dL — AB (ref 13.0–18.0)
LYMPHS ABS: 0.7 10*3/uL — AB (ref 1.0–3.6)
LYMPHS PCT: 34 %
MCH: 29.6 pg (ref 26.0–34.0)
MCHC: 33.4 g/dL (ref 32.0–36.0)
MCV: 88.7 fL (ref 80.0–100.0)
Monocytes Absolute: 0.1 10*3/uL — ABNORMAL LOW (ref 0.2–1.0)
Monocytes Relative: 5 %
NEUTROS ABS: 1.3 10*3/uL — AB (ref 1.4–6.5)
NEUTROS PCT: 59 %
PLATELETS: 250 10*3/uL (ref 150–440)
RBC: 3.46 MIL/uL — AB (ref 4.40–5.90)
RDW: 13.9 % (ref 11.5–14.5)
WBC: 2.2 10*3/uL — AB (ref 3.8–10.6)

## 2016-11-27 LAB — COMPREHENSIVE METABOLIC PANEL
ALK PHOS: 61 U/L (ref 38–126)
ALT: 15 U/L — AB (ref 17–63)
AST: 25 U/L (ref 15–41)
Albumin: 3.1 g/dL — ABNORMAL LOW (ref 3.5–5.0)
Anion gap: 12 (ref 5–15)
BUN: 43 mg/dL — AB (ref 6–20)
CALCIUM: 7.7 mg/dL — AB (ref 8.9–10.3)
CHLORIDE: 101 mmol/L (ref 101–111)
CO2: 21 mmol/L — ABNORMAL LOW (ref 22–32)
CREATININE: 1.46 mg/dL — AB (ref 0.61–1.24)
GFR calc Af Amer: 50 mL/min — ABNORMAL LOW (ref >60)
GFR, EST NON AFRICAN AMERICAN: 43 mL/min — AB (ref >60)
Glucose, Bld: 141 mg/dL — ABNORMAL HIGH (ref 65–99)
Potassium: 3.7 mmol/L (ref 3.5–5.1)
Sodium: 134 mmol/L — ABNORMAL LOW (ref 135–145)
Total Bilirubin: 0.5 mg/dL (ref 0.3–1.2)
Total Protein: 7.3 g/dL (ref 6.5–8.1)

## 2016-12-03 ENCOUNTER — Other Ambulatory Visit: Payer: Self-pay | Admitting: Oncology

## 2016-12-03 DIAGNOSIS — Z7189 Other specified counseling: Secondary | ICD-10-CM

## 2016-12-11 ENCOUNTER — Other Ambulatory Visit: Payer: Self-pay | Admitting: Oncology

## 2016-12-11 NOTE — Progress Notes (Signed)
[place]  Telephone:(336) [phone]  Fax:([phone]) [place] DOB: 1933/12/07  MR#: [id number]  ZWC#:[id number]  Patient Care Team: System, Pcp Not In as PCP - General   CHIEF COMPLAINT: Prostate cancer with retroperitoneal adenopathy.  INTERVAL HISTORY:  Patient returns to clinic today for further evaluation and situation of cycle 2 Taxotere.  Patient had a difficult time after cycle 1 with increased weakness and fatigue and a poor appetite with increased nausea.  He feels improved now and nearly back to his baseline.  He denies any bony pain. He has no neurologic complaints. He denies any recent fevers or illnesses. He denies any chest pain or shortness of breath. He denies any vomiting, constipation, or diarrhea. He has no urinary complaints. Patient offers no further specific complaints today.   REVIEW OF SYSTEMS:   Review of Systems  Constitutional: Positive for malaise/fatigue. Negative for diaphoresis, fever and weight loss.  Respiratory: Negative.  Negative for cough and shortness of breath.   Cardiovascular: Negative.  Negative for chest pain and leg swelling.  Gastrointestinal: Positive for nausea. Negative for abdominal pain, constipation and vomiting.  Genitourinary: Negative.  Negative for flank pain and frequency.  Musculoskeletal: Negative.   Skin: Negative.  Negative for rash.  Neurological: Positive for weakness. Negative for dizziness and sensory change.  Psychiatric/Behavioral: Negative.  The patient is not nervous/anxious.     As per HPI. Otherwise, a complete review of systems is negative.  ONCOLOGY HISTORY: Oncology History   1. prostate cancer, extra capsule extention, also illiac and retroperitoneal adenopathy on ct, small lesion liver possible metastatic, minimal uptake one area t spine. Asymptomatic. Not a surgical candidate with CT findings. Considdered not bulk and not visceral disease. Psa 13.6 prior to tx...casodex , then  lupron first dose 12/31     Primary prostate cancer with metastasis from prostate to other site [facility])    PAST MEDICAL HISTORY: Past Medical History:  Diagnosis Date  . Cancer of prostate ([place]) 07/13/2014  . Diabetes mellitus without complication [facility])    Patient takes Metformin  . Hypertension     PAST SURGICAL HISTORY: History reviewed. No pertinent surgical history.  FAMILY HISTORY: Reviewed and unchanged. No reported history of malignancy or chronic disease.    ADVANCED DIRECTIVES:    HEALTH MAINTENANCE: Social History   Tobacco Use  . Smoking status: Current Every Day Smoker  . Smokeless tobacco: Never Used  Substance Use Topics  . Alcohol use: Yes    Alcohol/week: 0.0 oz  . Drug use: No     No Known Allergies  Current Outpatient Medications  Medication Sig Dispense Refill  . aspirin 325 MG EC tablet Take 325 mg by mouth daily.    . fluvastatin (LESCOL) 20 MG capsule Take 20 mg by mouth at bedtime.    . lidocaine-prilocaine (EMLA) cream Apply to affected area once 30 g 3  . lisinopril-hydrochlorothiazide (PRINZIDE,ZESTORETIC) 20-12.5 MG tablet     . megestrol (MEGACE) 40 MG tablet Take 1 tablet (40 mg total) daily by mouth. 30 tablet 1  . metFORMIN (GLUCOPHAGE) 500 MG tablet 500 mg daily at 6 (six) AM.     . Omega-3 Fatty Acids (FISH OIL) 1000 MG CAPS Take by mouth 4 (four) times daily.    . tamsulosin (FLOMAX) 0.4 MG CAPS capsule Take 0.4 mg by mouth 2 (two) times daily.    . enzalutamide (XTANDI) 40 MG capsule Take 4 capsules (160 mg total) by mouth daily. (  Patient not taking: Reported on 11/21/2016) 360 capsule 3  . ondansetron (ZOFRAN) 8 MG tablet Take 1 tablet (8 mg total) 2 (two) times daily as needed by mouth for refractory nausea / vomiting. (Patient not taking: Reported on 11/21/2016) 30 tablet 2  . prochlorperazine (COMPAZINE) 10 MG tablet Take 1 tablet (10 mg total) every 6 (six) hours as needed by mouth (Nausea or vomiting). (Patient not taking:  Reported on 11/21/2016) 60 tablet 2   No current facility-administered medications for this visit.     OBJECTIVE: BP 90/65   Pulse 96   Temp 97.8 F (36.6 C) (Tympanic)   Resp 18   Wt 203 lb 1.6 oz (92.1 kg)   BMI 30.88 kg/m    Body mass index is 30.88 kg/m.    ECOG FS:0 - Asymptomatic  General: Well-developed, well-nourished, no acute distress. Eyes: Pink conjunctiva, anicteric sclera. HEENT: Normocephalic, moist mucous membranes, clear oropharnyx. Breasts: Gynecomastia, without lumps or masses. Lungs: Clear to auscultation bilaterally. Heart: Regular rate and rhythm. No rubs, murmurs, or gallops. Musculoskeletal: No edema, cyanosis, or clubbing. Occasional knee and joint pain. Neuro: Alert, answering all questions appropriately. Cranial nerves grossly intact. Skin: No rashes or petechiae noted. Psych: Normal affect.   LAB RESULTS:  Appointment on 12/12/2016  Component Date Value Ref Range Status  . WBC 12/12/2016 12.4* 3.8 - 10.6 K/uL Final  . RBC 12/12/2016 3.59* 4.40 - 5.90 MIL/uL Final  . Hemoglobin 12/12/2016 10.6* 13.0 - 18.0 g/dL Final  . HCT 12/12/2016 32.4* 40.0 - 52.0 % Final  . MCV 12/12/2016 90.2  80.0 - 100.0 fL Final  . MCH 12/12/2016 29.5  26.0 - 34.0 pg Final  . MCHC 12/12/2016 32.7  32.0 - 36.0 g/dL Final  . RDW 12/12/2016 14.7* 11.5 - 14.5 % Final  . Platelets 12/12/2016 420  150 - 440 K/uL Final  . Neutrophils Relative % 12/12/2016 78  % Final  . Neutro Abs 12/12/2016 9.9* 1.4 - 6.5 K/uL Final  . Lymphocytes Relative 12/12/2016 15  % Final  . Lymphs Abs 12/12/2016 1.8  1.0 - 3.6 K/uL Final  . Monocytes Relative 12/12/2016 6  % Final  . Monocytes Absolute 12/12/2016 0.7  0.2 - 1.0 K/uL Final  . Eosinophils Relative 12/12/2016 0  % Final  . Eosinophils Absolute 12/12/2016 0.0  0 - 0.7 K/uL Final  . Basophils Relative 12/12/2016 1  % Final  . Basophils Absolute 12/12/2016 0.1  0 - 0.1 K/uL Final  . Prostatic Specific Antigen 12/12/2016 17.44* 0.00 -  4.00 ng/mL Final   Comment: (NOTE) While PSA levels of <=4.0 ng/ml are reported as reference range, some men with levels below 4.0 ng/ml can have prostate cancer and many men with PSA above 4.0 ng/ml do not have prostate cancer.  Other tests such as free PSA, age specific reference ranges, PSA velocity and PSA doubling time may be helpful especially in men less than 96 years old. Performed at [facility], [place] [address] [place], [place] [address]   . Sodium 12/12/2016 133* 135 - 145 mmol/L Final  . Potassium 12/12/2016 5.6* 3.5 - 5.1 mmol/L Final  . Chloride 12/12/2016 105  101 - 111 mmol/L Final  . CO2 12/12/2016 18* 22 - 32 mmol/L Final  . Glucose, Bld 12/12/2016 137* 65 - 99 mg/dL Final  . BUN 12/12/2016 38* 6 - 20 mg/dL Final  . Creatinine, Ser 12/12/2016 1.64* 0.61 - 1.24 mg/dL Final  . Calcium 12/12/2016 8.3* 8.9 - 10.3 mg/dL  Final  . Total Protein 12/12/2016 7.3  6.5 - 8.1 g/dL Final  . Albumin 12/12/2016 3.4* 3.5 - 5.0 g/dL Final  . AST 12/12/2016 20  15 - 41 U/L Final  . ALT 12/12/2016 20  17 - 63 U/L Final  . Alkaline Phosphatase 12/12/2016 76  38 - 126 U/L Final  . Total Bilirubin 12/12/2016 0.5  0.3 - 1.2 mg/dL Final  . GFR calc non Af Amer 12/12/2016 38* >60 mL/min Final  . GFR calc Af Amer 12/12/2016 44* >60 mL/min Final   Comment: (NOTE) The eGFR has been calculated using the CKD EPI equation. This calculation has not been validated in all clinical situations. eGFR's persistently <60 mL/min signify possible Chronic Kidney Disease.   . Anion gap 12/12/2016 10  5 - 15 Final   Lab Results  Component Value Date   PSA 0.95 05/21/2016    STUDIES: No results found.  ASSESSMENT:  Prostate cancer with retroperitoneal adenopathy.  PLAN:     1. Prostate cancer with retroperitoneal adenopathy: PSA continues to trend up and is now 17.44.  CT scans and nuclear medicine bone scan from November 12, 2016 reviewed independently with new widely spread metastatic  disease.  After discussion with the patient is family, he has agreed to salvage chemotherapy using single agent Taxotere.  Patient may require OnPro Neulasta support with treatments, but will hold at this time. Patient will also receive Zometa on the odd-numbered cycles for his bony disease.  Plan on giving treatments every 3 weeks with repeat imaging after 4 cycles.  Proceed with cycle 2 of single agent Taxotere, but will dose reduce to 10% given his difficulties with cycle 1.  Patient will also receive an additional liter of IV fluids today.  Return to clinic in 1 week for laboratory work and then in 3 weeks for further evaluation and consideration of cycle 3.     2. Chronic renal insufficiency: Patient's creatinine appears to be at his baseline. 3.  Constipation: Continue OTC treatment at this time.  Patient states MiraLAX does not work and is going to take Dulcolax instead. 4.  Hypotension: 1 L of fluids as above.  Patient was also offered to return to clinic weeks for additional fluids, but he declined at this time. 5.  Poor appetite: Patient was given a prescription for Megace today.  Consider dietary referral in the future.  Approximately 30 minutes was spent in discussion of which greater than 50% was consultation.  Patient expressed understanding and was in agreement with this plan. He also understands that He can call clinic at any time with any questions, concerns, or complaints.    Lloyd Huger, MD   04/26/2015 9:38 AM

## 2016-12-12 ENCOUNTER — Inpatient Hospital Stay: Payer: Medicare Other

## 2016-12-12 ENCOUNTER — Other Ambulatory Visit: Payer: Self-pay

## 2016-12-12 ENCOUNTER — Inpatient Hospital Stay: Payer: Medicare Other | Attending: Oncology

## 2016-12-12 ENCOUNTER — Encounter: Payer: Self-pay | Admitting: Oncology

## 2016-12-12 ENCOUNTER — Inpatient Hospital Stay (HOSPITAL_BASED_OUTPATIENT_CLINIC_OR_DEPARTMENT_OTHER): Payer: Medicare Other | Admitting: Oncology

## 2016-12-12 VITALS — BP 90/65 | HR 96 | Temp 97.8°F | Resp 18 | Wt 203.1 lb

## 2016-12-12 DIAGNOSIS — I10 Essential (primary) hypertension: Secondary | ICD-10-CM | POA: Insufficient documentation

## 2016-12-12 DIAGNOSIS — R634 Abnormal weight loss: Secondary | ICD-10-CM | POA: Insufficient documentation

## 2016-12-12 DIAGNOSIS — I959 Hypotension, unspecified: Secondary | ICD-10-CM | POA: Diagnosis not present

## 2016-12-12 DIAGNOSIS — Z7982 Long term (current) use of aspirin: Secondary | ICD-10-CM | POA: Diagnosis not present

## 2016-12-12 DIAGNOSIS — R531 Weakness: Secondary | ICD-10-CM

## 2016-12-12 DIAGNOSIS — R630 Anorexia: Secondary | ICD-10-CM

## 2016-12-12 DIAGNOSIS — R5383 Other fatigue: Secondary | ICD-10-CM

## 2016-12-12 DIAGNOSIS — Z7984 Long term (current) use of oral hypoglycemic drugs: Secondary | ICD-10-CM

## 2016-12-12 DIAGNOSIS — C61 Malignant neoplasm of prostate: Secondary | ICD-10-CM

## 2016-12-12 DIAGNOSIS — K5900 Constipation, unspecified: Secondary | ICD-10-CM | POA: Diagnosis not present

## 2016-12-12 DIAGNOSIS — Z79899 Other long term (current) drug therapy: Secondary | ICD-10-CM | POA: Insufficient documentation

## 2016-12-12 DIAGNOSIS — E860 Dehydration: Secondary | ICD-10-CM

## 2016-12-12 DIAGNOSIS — R599 Enlarged lymph nodes, unspecified: Secondary | ICD-10-CM | POA: Diagnosis not present

## 2016-12-12 DIAGNOSIS — Z5111 Encounter for antineoplastic chemotherapy: Secondary | ICD-10-CM | POA: Insufficient documentation

## 2016-12-12 DIAGNOSIS — F17210 Nicotine dependence, cigarettes, uncomplicated: Secondary | ICD-10-CM | POA: Insufficient documentation

## 2016-12-12 DIAGNOSIS — N2889 Other specified disorders of kidney and ureter: Secondary | ICD-10-CM | POA: Insufficient documentation

## 2016-12-12 DIAGNOSIS — R110 Nausea: Secondary | ICD-10-CM | POA: Insufficient documentation

## 2016-12-12 DIAGNOSIS — Z7689 Persons encountering health services in other specified circumstances: Secondary | ICD-10-CM | POA: Diagnosis not present

## 2016-12-12 DIAGNOSIS — E119 Type 2 diabetes mellitus without complications: Secondary | ICD-10-CM | POA: Diagnosis not present

## 2016-12-12 LAB — COMPREHENSIVE METABOLIC PANEL
ALK PHOS: 76 U/L (ref 38–126)
ALT: 20 U/L (ref 17–63)
AST: 20 U/L (ref 15–41)
Albumin: 3.4 g/dL — ABNORMAL LOW (ref 3.5–5.0)
Anion gap: 10 (ref 5–15)
BUN: 38 mg/dL — AB (ref 6–20)
CALCIUM: 8.3 mg/dL — AB (ref 8.9–10.3)
CHLORIDE: 105 mmol/L (ref 101–111)
CO2: 18 mmol/L — AB (ref 22–32)
CREATININE: 1.64 mg/dL — AB (ref 0.61–1.24)
GFR calc Af Amer: 44 mL/min — ABNORMAL LOW (ref >60)
GFR calc non Af Amer: 38 mL/min — ABNORMAL LOW (ref >60)
GLUCOSE: 137 mg/dL — AB (ref 65–99)
Potassium: 5.6 mmol/L — ABNORMAL HIGH (ref 3.5–5.1)
SODIUM: 133 mmol/L — AB (ref 135–145)
Total Bilirubin: 0.5 mg/dL (ref 0.3–1.2)
Total Protein: 7.3 g/dL (ref 6.5–8.1)

## 2016-12-12 LAB — CBC WITH DIFFERENTIAL/PLATELET
BASOS ABS: 0.1 10*3/uL (ref 0–0.1)
BASOS PCT: 1 %
EOS PCT: 0 %
Eosinophils Absolute: 0 10*3/uL (ref 0–0.7)
HEMATOCRIT: 32.4 % — AB (ref 40.0–52.0)
Hemoglobin: 10.6 g/dL — ABNORMAL LOW (ref 13.0–18.0)
Lymphocytes Relative: 15 %
Lymphs Abs: 1.8 10*3/uL (ref 1.0–3.6)
MCH: 29.5 pg (ref 26.0–34.0)
MCHC: 32.7 g/dL (ref 32.0–36.0)
MCV: 90.2 fL (ref 80.0–100.0)
MONO ABS: 0.7 10*3/uL (ref 0.2–1.0)
MONOS PCT: 6 %
NEUTROS ABS: 9.9 10*3/uL — AB (ref 1.4–6.5)
Neutrophils Relative %: 78 %
PLATELETS: 420 10*3/uL (ref 150–440)
RBC: 3.59 MIL/uL — ABNORMAL LOW (ref 4.40–5.90)
RDW: 14.7 % — AB (ref 11.5–14.5)
WBC: 12.4 10*3/uL — ABNORMAL HIGH (ref 3.8–10.6)

## 2016-12-12 LAB — PSA: Prostatic Specific Antigen: 17.44 ng/mL — ABNORMAL HIGH (ref 0.00–4.00)

## 2016-12-12 MED ORDER — DEXAMETHASONE SODIUM PHOSPHATE 10 MG/ML IJ SOLN
10.0000 mg | Freq: Once | INTRAMUSCULAR | Status: AC
  Administered 2016-12-12: 10 mg via INTRAVENOUS
  Filled 2016-12-12: qty 1

## 2016-12-12 MED ORDER — SODIUM CHLORIDE 0.9 % IV SOLN
Freq: Once | INTRAVENOUS | Status: AC
  Administered 2016-12-12: 11:00:00 via INTRAVENOUS
  Filled 2016-12-12: qty 1000

## 2016-12-12 MED ORDER — PEGFILGRASTIM 6 MG/0.6ML ~~LOC~~ PSKT
6.0000 mg | PREFILLED_SYRINGE | Freq: Once | SUBCUTANEOUS | Status: AC
  Administered 2016-12-12: 6 mg via SUBCUTANEOUS
  Filled 2016-12-12: qty 0.6

## 2016-12-12 MED ORDER — SODIUM CHLORIDE 0.9 % IV SOLN
67.5000 mg/m2 | Freq: Once | INTRAVENOUS | Status: AC
  Administered 2016-12-12: 140 mg via INTRAVENOUS
  Filled 2016-12-12: qty 14

## 2016-12-12 NOTE — Progress Notes (Signed)
Pt receiving Taxotere today. Potassium and Creatinine elevated. Dr Grayland Ormond aware and okay to proceed with treatment. Adding 1L of NS

## 2016-12-12 NOTE — Progress Notes (Signed)
Patient reports weakness and fatigue with last treatment. Patient reports he still has some leg weakness. Patient had nausea x 1 the day after treatment.

## 2016-12-19 ENCOUNTER — Inpatient Hospital Stay: Payer: Medicare Other

## 2016-12-19 VITALS — BP 113/76 | HR 105 | Temp 98.2°F | Resp 18

## 2016-12-19 DIAGNOSIS — Z5111 Encounter for antineoplastic chemotherapy: Secondary | ICD-10-CM | POA: Diagnosis not present

## 2016-12-19 DIAGNOSIS — C61 Malignant neoplasm of prostate: Secondary | ICD-10-CM

## 2016-12-19 DIAGNOSIS — R110 Nausea: Secondary | ICD-10-CM

## 2016-12-19 LAB — COMPREHENSIVE METABOLIC PANEL
ALBUMIN: 4.2 g/dL (ref 3.5–5.0)
ALK PHOS: 80 U/L (ref 38–126)
ALT: 15 U/L — AB (ref 17–63)
AST: 22 U/L (ref 15–41)
Anion gap: 9 (ref 5–15)
BUN: 21 mg/dL — AB (ref 6–20)
CHLORIDE: 103 mmol/L (ref 101–111)
CO2: 27 mmol/L (ref 22–32)
CREATININE: 0.97 mg/dL (ref 0.61–1.24)
Calcium: 9.3 mg/dL (ref 8.9–10.3)
GFR calc Af Amer: >60 mL/min (ref >60)
GFR calc non Af Amer: >60 mL/min (ref >60)
GLUCOSE: 101 mg/dL — AB (ref 65–99)
Potassium: 3.6 mmol/L (ref 3.5–5.1)
SODIUM: 139 mmol/L (ref 135–145)
Total Bilirubin: 0.4 mg/dL (ref 0.3–1.2)
Total Protein: 7.7 g/dL (ref 6.5–8.1)

## 2016-12-19 LAB — CBC WITH DIFFERENTIAL/PLATELET
Basophils Absolute: 0.2 10*3/uL — ABNORMAL HIGH (ref 0–0.1)
Basophils Relative: 1 %
EOS ABS: 0.1 10*3/uL (ref 0–0.7)
EOS PCT: 0 %
HCT: 29.7 % — ABNORMAL LOW (ref 40.0–52.0)
Hemoglobin: 9.7 g/dL — ABNORMAL LOW (ref 13.0–18.0)
LYMPHS ABS: 2.2 10*3/uL (ref 1.0–3.6)
Lymphocytes Relative: 7 %
MCH: 29 pg (ref 26.0–34.0)
MCHC: 32.6 g/dL (ref 32.0–36.0)
MCV: 88.9 fL (ref 80.0–100.0)
MONOS PCT: 3 %
Monocytes Absolute: 0.9 10*3/uL (ref 0.2–1.0)
Neutro Abs: 27.4 10*3/uL — ABNORMAL HIGH (ref 1.4–6.5)
Neutrophils Relative %: 89 %
PLATELETS: 312 10*3/uL (ref 150–440)
RBC: 3.34 MIL/uL — ABNORMAL LOW (ref 4.40–5.90)
RDW: 15 % — ABNORMAL HIGH (ref 11.5–14.5)
WBC: 30.7 10*3/uL — ABNORMAL HIGH (ref 3.8–10.6)

## 2016-12-19 LAB — PSA: PROSTATIC SPECIFIC ANTIGEN: 13.67 ng/mL — AB (ref 0.00–4.00)

## 2016-12-19 MED ORDER — ONDANSETRON HCL 4 MG/2ML IJ SOLN: 8.0000 mg | Freq: Once | INTRAMUSCULAR | Status: AC

## 2016-12-19 MED ORDER — DEXAMETHASONE SODIUM PHOSPHATE 10 MG/ML IJ SOLN
10.0000 mg | Freq: Once | INTRAMUSCULAR | Status: AC
  Administered 2016-12-19: 10 mg via INTRAVENOUS
  Filled 2016-12-19: qty 1

## 2016-12-19 MED ORDER — SODIUM CHLORIDE 0.9 % IV SOLN: Freq: Once | INTRAVENOUS | Status: DC

## 2016-12-19 MED ORDER — SODIUM CHLORIDE 0.9 % IV SOLN
Freq: Once | INTRAVENOUS | Status: AC
  Administered 2016-12-19: 15:00:00 via INTRAVENOUS
  Filled 2016-12-19: qty 1000

## 2016-12-31 NOTE — Progress Notes (Signed)
[place]  Telephone:(336) [phone]  Fax:([phone]) [place] DOB: 11-Mar-1933  MR#: [id number]  ELF#:[id number]  Patient Care Team: System, Pcp Not In as PCP - General   CHIEF COMPLAINT: Prostate cancer with retroperitoneal adenopathy.  INTERVAL HISTORY:  Patient returns to clinic today for further evaluation and consideration of cycle 3 of Taxotere.  Patient tolerated cycle 2 after dose reducing Taxotere.  He continues to have weakness and fatigue, but otherwise feels well.  He denies any bony pain. He has no neurologic complaints. He denies any recent fevers or illnesses. He denies any chest pain or shortness of breath. He denies any vomiting, constipation, or diarrhea. He has no urinary complaints. Patient offers no further specific complaints today.   REVIEW OF SYSTEMS:   Review of Systems  Constitutional: Positive for malaise/fatigue. Negative for diaphoresis, fever and weight loss.  Respiratory: Negative.  Negative for cough and shortness of breath.   Cardiovascular: Negative.  Negative for chest pain and leg swelling.  Gastrointestinal: Positive for nausea. Negative for abdominal pain, constipation and vomiting.  Genitourinary: Negative.  Negative for flank pain and frequency.  Musculoskeletal: Negative.   Skin: Negative.  Negative for rash.  Neurological: Positive for weakness. Negative for dizziness and sensory change.  Psychiatric/Behavioral: Negative.  The patient is not nervous/anxious.     As per HPI. Otherwise, a complete review of systems is negative.  ONCOLOGY HISTORY: Oncology History   1. prostate cancer, extra capsule extention, also illiac and retroperitoneal adenopathy on ct, small lesion liver possible metastatic, minimal uptake one area t spine. Asymptomatic. Not a surgical candidate with CT findings. Considdered not bulk and not visceral disease. Psa 13.6 prior to tx...casodex , then lupron first dose 12/31     Primary  prostate cancer with metastasis from prostate to other site [facility])    PAST MEDICAL HISTORY: Past Medical History:  Diagnosis Date  . Cancer of prostate ([place]) 07/13/2014  . Diabetes mellitus without complication [facility])    Patient takes Metformin  . Hypertension     PAST SURGICAL HISTORY: No past surgical history on file.  FAMILY HISTORY: Reviewed and unchanged. No reported history of malignancy or chronic disease.    ADVANCED DIRECTIVES:    HEALTH MAINTENANCE: Social History   Tobacco Use  . Smoking status: Current Every Day Smoker  . Smokeless tobacco: Never Used  Substance Use Topics  . Alcohol use: Yes    Alcohol/week: 0.0 oz  . Drug use: No     No Known Allergies  Current Outpatient Medications  Medication Sig Dispense Refill  . aspirin 325 MG EC tablet Take 325 mg by mouth daily.    . fluvastatin (LESCOL) 20 MG capsule Take 20 mg by mouth at bedtime.    . lidocaine-prilocaine (EMLA) cream Apply to affected area once 30 g 3  . lisinopril-hydrochlorothiazide (PRINZIDE,ZESTORETIC) 20-12.5 MG tablet     . megestrol (MEGACE) 40 MG tablet Take 1 tablet (40 mg total) daily by mouth. 30 tablet 1  . metFORMIN (GLUCOPHAGE) 500 MG tablet 500 mg daily at 6 (six) AM.     . Omega-3 Fatty Acids (FISH OIL) 1000 MG CAPS Take by mouth 4 (four) times daily.    . ondansetron (ZOFRAN) 8 MG tablet Take 1 tablet (8 mg total) 2 (two) times daily as needed by mouth for refractory nausea / vomiting. 30 tablet 2  . prochlorperazine (COMPAZINE) 10 MG tablet Take 1 tablet (10 mg total) every 6 (six)  hours as needed by mouth (Nausea or vomiting). 60 tablet 2  . tamsulosin (FLOMAX) 0.4 MG CAPS capsule Take 0.4 mg by mouth 2 (two) times daily.    . enzalutamide (XTANDI) 40 MG capsule Take 4 capsules (160 mg total) by mouth daily. (Patient not taking: Reported on 11/21/2016) 360 capsule 3   No current facility-administered medications for this visit.    Facility-Administered Medications Ordered in  Other Visits  Medication Dose Route Frequency Provider Last Rate Last Dose  . ondansetron (ZOFRAN) injection 8 mg  8 mg Intravenous Once Lloyd Huger, MD        OBJECTIVE: BP 96/61 (BP Location: Left Arm, Patient Position: Sitting)   Pulse 100   Temp (!) 96 F (35.6 C) (Tympanic)   Resp 18   Wt 201 lb 6.4 oz (91.4 kg)   BMI 30.62 kg/m    Body mass index is 30.62 kg/m.    ECOG FS:0 - Asymptomatic  General: Well-developed, well-nourished, no acute distress. Eyes: Pink conjunctiva, anicteric sclera. HEENT: Normocephalic, moist mucous membranes, clear oropharnyx. Breasts: Gynecomastia, without lumps or masses. Lungs: Clear to auscultation bilaterally. Heart: Regular rate and rhythm. No rubs, murmurs, or gallops. Musculoskeletal: No edema, cyanosis, or clubbing. Occasional knee and joint pain. Neuro: Alert, answering all questions appropriately. Cranial nerves grossly intact. Skin: No rashes or petechiae noted. Psych: Normal affect.   LAB RESULTS:  Appointment on 01/02/2017  Component Date Value Ref Range Status  . WBC 01/02/2017 11.0* 3.8 - 10.6 K/uL Final  . RBC 01/02/2017 3.25* 4.40 - 5.90 MIL/uL Final  . Hemoglobin 01/02/2017 9.7* 13.0 - 18.0 g/dL Final  . HCT 01/02/2017 29.2* 40.0 - 52.0 % Final  . MCV 01/02/2017 89.7  80.0 - 100.0 fL Final  . MCH 01/02/2017 29.9  26.0 - 34.0 pg Final  . MCHC 01/02/2017 33.3  32.0 - 36.0 g/dL Final  . RDW 01/02/2017 15.8* 11.5 - 14.5 % Final  . Platelets 01/02/2017 408  150 - 440 K/uL Final  . Neutrophils Relative % 01/02/2017 81  % Final  . Neutro Abs 01/02/2017 8.9* 1.4 - 6.5 K/uL Final  . Lymphocytes Relative 01/02/2017 10  % Final  . Lymphs Abs 01/02/2017 1.1  1.0 - 3.6 K/uL Final  . Monocytes Relative 01/02/2017 8  % Final  . Monocytes Absolute 01/02/2017 0.8  0.2 - 1.0 K/uL Final  . Eosinophils Relative 01/02/2017 0  % Final  . Eosinophils Absolute 01/02/2017 0.0  0 - 0.7 K/uL Final  . Basophils Relative 01/02/2017 1  %  Final  . Basophils Absolute 01/02/2017 0.1  0 - 0.1 K/uL Final   Performed at [facility], [address], [place], [place] [address]  . Prostatic Specific Antigen 01/02/2017 17.76* 0.00 - 4.00 ng/mL Final   Comment: (NOTE) While PSA levels of <=4.0 ng/ml are reported as reference range, some men with levels below 4.0 ng/ml can have prostate cancer and many men with PSA above 4.0 ng/ml do not have prostate cancer.  Other tests such as free PSA, age specific reference ranges, PSA velocity and PSA doubling time may be helpful especially in men less than 52 years old. Performed at [facility], [place] [address], [place], [place] [address]   . Sodium 01/02/2017 134* 135 - 145 mmol/L Final  . Potassium 01/02/2017 4.9  3.5 - 5.1 mmol/L Final  . Chloride 01/02/2017 106  101 - 111 mmol/L Final  . CO2 01/02/2017 20* 22 - 32 mmol/L Final  .  Glucose, Bld 01/02/2017 126* 65 - 99 mg/dL Final  . BUN 01/02/2017 27* 6 - 20 mg/dL Final  . Creatinine, Ser 01/02/2017 1.07  0.61 - 1.24 mg/dL Final  . Calcium 01/02/2017 8.6* 8.9 - 10.3 mg/dL Final  . Total Protein 01/02/2017 7.1  6.5 - 8.1 g/dL Final  . Albumin 01/02/2017 3.2* 3.5 - 5.0 g/dL Final  . AST 01/02/2017 23  15 - 41 U/L Final  . ALT 01/02/2017 19  17 - 63 U/L Final  . Alkaline Phosphatase 01/02/2017 74  38 - 126 U/L Final  . Total Bilirubin 01/02/2017 0.6  0.3 - 1.2 mg/dL Final  . GFR calc non Af Amer 01/02/2017 >60  >60 mL/min Final  . GFR calc Af Amer 01/02/2017 >60  >60 mL/min Final   Comment: (NOTE) The eGFR has been calculated using the CKD EPI equation. This calculation has not been validated in all clinical situations. eGFR's persistently <60 mL/min signify possible Chronic Kidney Disease.   Georgiann Hahn gap 01/02/2017 8  5 - 15 Final   Performed at [facility], [place], [address] [place], [place] [address]     STUDIES: No results found.  ASSESSMENT:  Prostate cancer with retroperitoneal adenopathy.  PLAN:      1. Prostate cancer with retroperitoneal adenopathy: PSA initially trended down to 13.6, but today's result is increased to 17.76.  CT scans and nuclear medicine bone scan from November 12, 2016 reviewed independently with new widely spread metastatic disease.  After discussion with the patient is family, he has agreed to salvage chemotherapy using single agent Taxotere.  Patient may require OnPro Neulasta support with treatments, but will hold at this time. Patient will also receive Zometa on the odd-numbered cycles for his bony disease.  Plan on giving treatments every 3 weeks with repeat imaging after 4 cycles.  Proceed with cycle 3 of single agent dose reduced Taxotere.  Patient will also receive an additional liter of IV fluids today.  Return to clinic in 3 weeks for further evaluation and consideration of cycle 4.     2. Chronic renal insufficiency: Patient's creatinine is now within normal limits. 3.  Constipation: Continue OTC treatments.   4.  Hypotension: 1 L of fluids as above.  Patient was also offered to return to clinic weeks for additional fluids, but he declined at this time. 5.  Poor appetite: Continue Megace, patient has declined dietary referral.   Patient expressed understanding and was in agreement with this plan. He also understands that He can call clinic at any time with any questions, concerns, or complaints.    Lloyd Huger, MD   04/26/2015 10:23 AM

## 2017-01-02 ENCOUNTER — Inpatient Hospital Stay (HOSPITAL_BASED_OUTPATIENT_CLINIC_OR_DEPARTMENT_OTHER): Payer: Medicare Other | Admitting: Oncology

## 2017-01-02 ENCOUNTER — Inpatient Hospital Stay: Payer: Medicare Other

## 2017-01-02 VITALS — BP 96/61 | HR 100 | Temp 96.0°F | Resp 18 | Wt 201.4 lb

## 2017-01-02 DIAGNOSIS — R5383 Other fatigue: Secondary | ICD-10-CM | POA: Diagnosis not present

## 2017-01-02 DIAGNOSIS — R634 Abnormal weight loss: Secondary | ICD-10-CM

## 2017-01-02 DIAGNOSIS — N2889 Other specified disorders of kidney and ureter: Secondary | ICD-10-CM | POA: Diagnosis not present

## 2017-01-02 DIAGNOSIS — I959 Hypotension, unspecified: Secondary | ICD-10-CM

## 2017-01-02 DIAGNOSIS — Z7689 Persons encountering health services in other specified circumstances: Secondary | ICD-10-CM

## 2017-01-02 DIAGNOSIS — F17210 Nicotine dependence, cigarettes, uncomplicated: Secondary | ICD-10-CM

## 2017-01-02 DIAGNOSIS — E119 Type 2 diabetes mellitus without complications: Secondary | ICD-10-CM

## 2017-01-02 DIAGNOSIS — R531 Weakness: Secondary | ICD-10-CM | POA: Diagnosis not present

## 2017-01-02 DIAGNOSIS — C61 Malignant neoplasm of prostate: Secondary | ICD-10-CM | POA: Diagnosis not present

## 2017-01-02 DIAGNOSIS — K5900 Constipation, unspecified: Secondary | ICD-10-CM | POA: Diagnosis not present

## 2017-01-02 DIAGNOSIS — R599 Enlarged lymph nodes, unspecified: Secondary | ICD-10-CM | POA: Diagnosis not present

## 2017-01-02 DIAGNOSIS — R110 Nausea: Secondary | ICD-10-CM | POA: Diagnosis not present

## 2017-01-02 DIAGNOSIS — Z79899 Other long term (current) drug therapy: Secondary | ICD-10-CM

## 2017-01-02 DIAGNOSIS — R630 Anorexia: Secondary | ICD-10-CM | POA: Diagnosis not present

## 2017-01-02 DIAGNOSIS — Z7982 Long term (current) use of aspirin: Secondary | ICD-10-CM

## 2017-01-02 DIAGNOSIS — Z5111 Encounter for antineoplastic chemotherapy: Secondary | ICD-10-CM | POA: Diagnosis not present

## 2017-01-02 DIAGNOSIS — I10 Essential (primary) hypertension: Secondary | ICD-10-CM

## 2017-01-02 DIAGNOSIS — E860 Dehydration: Secondary | ICD-10-CM

## 2017-01-02 DIAGNOSIS — Z7984 Long term (current) use of oral hypoglycemic drugs: Secondary | ICD-10-CM

## 2017-01-02 LAB — CBC WITH DIFFERENTIAL/PLATELET
Basophils Absolute: 0.1 10*3/uL (ref 0–0.1)
Basophils Relative: 1 %
EOS ABS: 0 10*3/uL (ref 0–0.7)
EOS PCT: 0 %
HCT: 29.2 % — ABNORMAL LOW (ref 40.0–52.0)
Hemoglobin: 9.7 g/dL — ABNORMAL LOW (ref 13.0–18.0)
LYMPHS ABS: 1.1 10*3/uL (ref 1.0–3.6)
LYMPHS PCT: 10 %
MCH: 29.9 pg (ref 26.0–34.0)
MCHC: 33.3 g/dL (ref 32.0–36.0)
MCV: 89.7 fL (ref 80.0–100.0)
MONO ABS: 0.8 10*3/uL (ref 0.2–1.0)
Monocytes Relative: 8 %
Neutro Abs: 8.9 10*3/uL — ABNORMAL HIGH (ref 1.4–6.5)
Neutrophils Relative %: 81 %
PLATELETS: 408 10*3/uL (ref 150–440)
RBC: 3.25 MIL/uL — AB (ref 4.40–5.90)
RDW: 15.8 % — AB (ref 11.5–14.5)
WBC: 11 10*3/uL — AB (ref 3.8–10.6)

## 2017-01-02 LAB — COMPREHENSIVE METABOLIC PANEL
ALT: 19 U/L (ref 17–63)
ANION GAP: 8 (ref 5–15)
AST: 23 U/L (ref 15–41)
Albumin: 3.2 g/dL — ABNORMAL LOW (ref 3.5–5.0)
Alkaline Phosphatase: 74 U/L (ref 38–126)
BILIRUBIN TOTAL: 0.6 mg/dL (ref 0.3–1.2)
BUN: 27 mg/dL — ABNORMAL HIGH (ref 6–20)
CHLORIDE: 106 mmol/L (ref 101–111)
CO2: 20 mmol/L — ABNORMAL LOW (ref 22–32)
Calcium: 8.6 mg/dL — ABNORMAL LOW (ref 8.9–10.3)
Creatinine, Ser: 1.07 mg/dL (ref 0.61–1.24)
Glucose, Bld: 126 mg/dL — ABNORMAL HIGH (ref 65–99)
POTASSIUM: 4.9 mmol/L (ref 3.5–5.1)
Sodium: 134 mmol/L — ABNORMAL LOW (ref 135–145)
TOTAL PROTEIN: 7.1 g/dL (ref 6.5–8.1)

## 2017-01-02 LAB — PSA: PROSTATIC SPECIFIC ANTIGEN: 17.76 ng/mL — AB (ref 0.00–4.00)

## 2017-01-02 MED ORDER — SODIUM CHLORIDE 0.9 % IV SOLN
67.5000 mg/m2 | Freq: Once | INTRAVENOUS | Status: AC
  Administered 2017-01-02: 140 mg via INTRAVENOUS
  Filled 2017-01-02: qty 14

## 2017-01-02 MED ORDER — SODIUM CHLORIDE 0.9 % IV SOLN
Freq: Once | INTRAVENOUS | Status: AC
  Administered 2017-01-02: 11:00:00 via INTRAVENOUS
  Filled 2017-01-02: qty 1000

## 2017-01-02 MED ORDER — PEGFILGRASTIM 6 MG/0.6ML ~~LOC~~ PSKT
6.0000 mg | PREFILLED_SYRINGE | Freq: Once | SUBCUTANEOUS | Status: AC
  Administered 2017-01-02: 6 mg via SUBCUTANEOUS
  Filled 2017-01-02: qty 0.6

## 2017-01-02 MED ORDER — ZOLEDRONIC ACID 4 MG/100ML IV SOLN
3.5000 mg | Freq: Once | INTRAVENOUS | Status: AC
  Administered 2017-01-02: 3.5 mg via INTRAVENOUS
  Filled 2017-01-02: qty 100

## 2017-01-02 MED ORDER — SODIUM CHLORIDE 0.9 % IV SOLN
Freq: Once | INTRAVENOUS | Status: AC
  Administered 2017-01-02: 12:00:00 via INTRAVENOUS
  Filled 2017-01-02: qty 1000

## 2017-01-02 MED ORDER — DEXAMETHASONE SODIUM PHOSPHATE 10 MG/ML IJ SOLN
10.0000 mg | Freq: Once | INTRAMUSCULAR | Status: AC
  Administered 2017-01-02: 10 mg via INTRAVENOUS
  Filled 2017-01-02: qty 1

## 2017-01-02 MED ORDER — SODIUM CHLORIDE 0.9 % IV SOLN
Freq: Once | INTRAVENOUS | Status: AC
  Filled 2017-01-02: qty 1000

## 2017-01-19 NOTE — Progress Notes (Signed)
[place]  Telephone:(336) [phone]  Fax:(336) [place] DOB: April 14, 1933  MR#: [id number]  PZW#:[id number]  Patient Care Team: System, Pcp Not In as PCP - General   CHIEF COMPLAINT: Prostate cancer with retroperitoneal adenopathy.  INTERVAL HISTORY:  Patient returns to clinic today for further evaluation and consideration of cycle 4 of dose reduced Taxotere.  He continues to have weakness and fatigue, but otherwise feels well.  He denies any bony pain. He has no neurologic complaints. He denies any recent fevers or illnesses. He denies any chest pain or shortness of breath. He denies any vomiting, constipation, or diarrhea. He has no urinary complaints. Patient offers no further specific complaints today.   REVIEW OF SYSTEMS:   Review of Systems  Constitutional: Positive for malaise/fatigue. Negative for diaphoresis, fever and weight loss.  Respiratory: Negative.  Negative for cough and shortness of breath.   Cardiovascular: Negative.  Negative for chest pain and leg swelling.  Gastrointestinal: Positive for nausea. Negative for abdominal pain, constipation and vomiting.  Genitourinary: Negative.  Negative for flank pain and frequency.  Musculoskeletal: Negative.   Skin: Negative.  Negative for rash.  Neurological: Positive for weakness. Negative for dizziness and sensory change.  Psychiatric/Behavioral: Negative.  The patient is not nervous/anxious.     As per HPI. Otherwise, a complete review of systems is negative.  ONCOLOGY HISTORY: Oncology History   1. prostate cancer, extra capsule extention, also illiac and retroperitoneal adenopathy on ct, small lesion liver possible metastatic, minimal uptake one area t spine. Asymptomatic. Not a surgical candidate with CT findings. Considdered not bulk and not visceral disease. Psa 13.6 prior to tx...casodex , then lupron first dose 12/31     Primary prostate cancer with metastasis from prostate to  other site [facility])    PAST MEDICAL HISTORY: Past Medical History:  Diagnosis Date  . Cancer of prostate ([place]) 07/13/2014  . Diabetes mellitus without complication [facility])    Patient takes Metformin  . Hypertension     PAST SURGICAL HISTORY: No past surgical history on file.  FAMILY HISTORY: Reviewed and unchanged. No reported history of malignancy or chronic disease.    ADVANCED DIRECTIVES:    HEALTH MAINTENANCE: Social History   Tobacco Use  . Smoking status: Current Every Day Smoker  . Smokeless tobacco: Never Used  Substance Use Topics  . Alcohol use: Yes    Alcohol/week: 0.0 oz  . Drug use: No     No Known Allergies  Current Outpatient Medications  Medication Sig Dispense Refill  . fluvastatin (LESCOL) 20 MG capsule Take 20 mg by mouth at bedtime.    . lidocaine-prilocaine (EMLA) cream Apply to affected area once 30 g 3  . lisinopril-hydrochlorothiazide (PRINZIDE,ZESTORETIC) 20-12.5 MG tablet     . megestrol (MEGACE) 40 MG tablet Take 1 tablet (40 mg total) daily by mouth. 30 tablet 1  . metFORMIN (GLUCOPHAGE) 500 MG tablet 500 mg daily at 6 (six) AM.     . Omega-3 Fatty Acids (FISH OIL) 1000 MG CAPS Take by mouth 4 (four) times daily.    . ondansetron (ZOFRAN) 8 MG tablet Take 1 tablet (8 mg total) 2 (two) times daily as needed by mouth for refractory nausea / vomiting. 30 tablet 2  . prochlorperazine (COMPAZINE) 10 MG tablet Take 1 tablet (10 mg total) every 6 (six) hours as needed by mouth (Nausea or vomiting). 60 tablet 2  . tamsulosin (FLOMAX) 0.4 MG CAPS capsule Take 0.4 mg  by mouth 2 (two) times daily.    Marland Kitchen aspirin 325 MG EC tablet Take 325 mg by mouth daily.    . enzalutamide (XTANDI) 40 MG capsule Take 4 capsules (160 mg total) by mouth daily. (Patient not taking: Reported on 11/21/2016) 360 capsule 3   No current facility-administered medications for this visit.    Facility-Administered Medications Ordered in Other Visits  Medication Dose Route Frequency  Provider Last Rate Last Dose  . ondansetron (ZOFRAN) injection 8 mg  8 mg Intravenous Once Lloyd Huger, MD        OBJECTIVE: BP 135/76 (BP Location: Left Arm, Patient Position: Sitting)   Pulse 66   Temp 97.8 F (36.6 C) (Tympanic)   Resp 18   Wt 198 lb (89.8 kg)   BMI 30.11 kg/m    Body mass index is 30.11 kg/m.    ECOG FS:0 - Asymptomatic  General: Well-developed, well-nourished, no acute distress. Eyes: Pink conjunctiva, anicteric sclera. Lungs: Clear to auscultation bilaterally. Heart: Regular rate and rhythm. No rubs, murmurs, or gallops. Musculoskeletal: No edema, cyanosis, or clubbing.  Neuro: Alert, answering all questions appropriately. Cranial nerves grossly intact. Skin: No rashes or petechiae noted. Psych: Normal affect.   LAB RESULTS:  Appointment on 01/23/2017  Component Date Value Ref Range Status  . Sodium 01/23/2017 134* 135 - 145 mmol/L Final  . Potassium 01/23/2017 5.0  3.5 - 5.1 mmol/L Final  . Chloride 01/23/2017 103  101 - 111 mmol/L Final  . CO2 01/23/2017 23  22 - 32 mmol/L Final  . Glucose, Bld 01/23/2017 115* 65 - 99 mg/dL Final  . BUN 01/23/2017 29* 6 - 20 mg/dL Final  . Creatinine, Ser 01/23/2017 1.13  0.61 - 1.24 mg/dL Final  . Calcium 01/23/2017 8.8* 8.9 - 10.3 mg/dL Final  . Total Protein 01/23/2017 6.6  6.5 - 8.1 g/dL Final  . Albumin 01/23/2017 3.2* 3.5 - 5.0 g/dL Final  . AST 01/23/2017 23  15 - 41 U/L Final  . ALT 01/23/2017 23  17 - 63 U/L Final  . Alkaline Phosphatase 01/23/2017 55  38 - 126 U/L Final  . Total Bilirubin 01/23/2017 0.5  0.3 - 1.2 mg/dL Final  . GFR calc non Af Amer 01/23/2017 59* >60 mL/min Final  . GFR calc Af Amer 01/23/2017 >60  >60 mL/min Final   Comment: (NOTE) The eGFR has been calculated using the CKD EPI equation. This calculation has not been validated in all clinical situations. eGFR's persistently <60 mL/min signify possible Chronic Kidney Disease.   Georgiann Hahn gap 01/23/2017 8  5 - 15 Final    Performed at [facility], [address] [place], [place] [address]  . WBC 01/23/2017 14.2* 3.8 - 10.6 K/uL Final  . RBC 01/23/2017 3.29* 4.40 - 5.90 MIL/uL Final  . Hemoglobin 01/23/2017 9.7* 13.0 - 18.0 g/dL Final  . HCT 01/23/2017 29.8* 40.0 - 52.0 % Final  . MCV 01/23/2017 90.6  80.0 - 100.0 fL Final  . MCH 01/23/2017 29.5  26.0 - 34.0 pg Final  . MCHC 01/23/2017 32.6  32.0 - 36.0 g/dL Final  . RDW 01/23/2017 17.8* 11.5 - 14.5 % Final  . Platelets 01/23/2017 381  150 - 440 K/uL Final  . Neutrophils Relative % 01/23/2017 87  % Final  . Neutro Abs 01/23/2017 12.5* 1.4 - 6.5 K/uL Final  . Lymphocytes Relative 01/23/2017 5  % Final  . Lymphs Abs 01/23/2017 0.7* 1.0 - 3.6 K/uL Final  . Monocytes Relative 01/23/2017  7  % Final  . Monocytes Absolute 01/23/2017 0.9  0.2 - 1.0 K/uL Final  . Eosinophils Relative 01/23/2017 0  % Final  . Eosinophils Absolute 01/23/2017 0.0  0 - 0.7 K/uL Final  . Basophils Relative 01/23/2017 1  % Final  . Basophils Absolute 01/23/2017 0.1  0 - 0.1 K/uL Final   Performed at [facility], [address] [place], [place] [address]  . Prostatic Specific Antigen 01/23/2017 16.96* 0.00 - 4.00 ng/mL Final   Comment: (NOTE) While PSA levels of <=4.0 ng/ml are reported as reference range, some men with levels below 4.0 ng/ml can have prostate cancer and many men with PSA above 4.0 ng/ml do not have prostate cancer.  Other tests such as free PSA, age specific reference ranges, PSA velocity and PSA doubling time may be helpful especially in men less than 83 years old. Performed at [facility], [place] [address], [place], [place] [address]      STUDIES: No results found.  ASSESSMENT:  Prostate cancer with retroperitoneal adenopathy.  PLAN:     1. Prostate cancer with retroperitoneal adenopathy: Patient's PSA remains elevated at 60.96, but is now trending down. CT scans and nuclear medicine bone scan from November 12, 2016 reviewed  independently with new widely spread metastatic disease.  Proceed with cycle 4 of dose reduce Taxotere and Neulasta support today.  Patient will also receive Zometa on the odd-numbered cycles for his bony disease.  Plan on giving treatments every 3 weeks with repeat imaging after 4 cycles. Return to clinic in 3 weeks for further evaluation and consideration of cycle 5. Will reimage with CT scan and bone scan prior to next treatment. 2. Chronic renal insufficiency: Patient's creatinine is now within normal limits. 3.  Constipation: Continue OTC treatments.   4.  Hypotension: Patient's blood pressure is now within normal limits.  He does not require additional IV fluids today.   5.  Poor appetite: Continue Megace, patient has declined dietary referral. 6.  Anemia: Mild, monitor.  Patient expressed understanding and was in agreement with this plan. He also understands that He can call clinic at any time with any questions, concerns, or complaints.    Lloyd Huger, MD   04/26/2015 8:45 AM

## 2017-01-23 ENCOUNTER — Inpatient Hospital Stay (HOSPITAL_BASED_OUTPATIENT_CLINIC_OR_DEPARTMENT_OTHER): Payer: Medicare Other | Admitting: Oncology

## 2017-01-23 ENCOUNTER — Inpatient Hospital Stay: Payer: Medicare Other | Attending: Oncology

## 2017-01-23 ENCOUNTER — Inpatient Hospital Stay: Payer: Medicare Other

## 2017-01-23 VITALS — BP 135/76 | HR 66 | Temp 97.8°F | Resp 18 | Wt 198.0 lb

## 2017-01-23 DIAGNOSIS — D649 Anemia, unspecified: Secondary | ICD-10-CM

## 2017-01-23 DIAGNOSIS — R530 Neoplastic (malignant) related fatigue: Secondary | ICD-10-CM

## 2017-01-23 DIAGNOSIS — Z7984 Long term (current) use of oral hypoglycemic drugs: Secondary | ICD-10-CM | POA: Diagnosis not present

## 2017-01-23 DIAGNOSIS — C7951 Secondary malignant neoplasm of bone: Secondary | ICD-10-CM

## 2017-01-23 DIAGNOSIS — Z5111 Encounter for antineoplastic chemotherapy: Secondary | ICD-10-CM | POA: Diagnosis not present

## 2017-01-23 DIAGNOSIS — K5900 Constipation, unspecified: Secondary | ICD-10-CM | POA: Insufficient documentation

## 2017-01-23 DIAGNOSIS — E119 Type 2 diabetes mellitus without complications: Secondary | ICD-10-CM

## 2017-01-23 DIAGNOSIS — Z7982 Long term (current) use of aspirin: Secondary | ICD-10-CM | POA: Insufficient documentation

## 2017-01-23 DIAGNOSIS — F17200 Nicotine dependence, unspecified, uncomplicated: Secondary | ICD-10-CM | POA: Diagnosis not present

## 2017-01-23 DIAGNOSIS — C61 Malignant neoplasm of prostate: Secondary | ICD-10-CM

## 2017-01-23 LAB — COMPREHENSIVE METABOLIC PANEL
ALT: 23 U/L (ref 17–63)
AST: 23 U/L (ref 15–41)
Albumin: 3.2 g/dL — ABNORMAL LOW (ref 3.5–5.0)
Alkaline Phosphatase: 55 U/L (ref 38–126)
Anion gap: 8 (ref 5–15)
BUN: 29 mg/dL — AB (ref 6–20)
CO2: 23 mmol/L (ref 22–32)
CREATININE: 1.13 mg/dL (ref 0.61–1.24)
Calcium: 8.8 mg/dL — ABNORMAL LOW (ref 8.9–10.3)
Chloride: 103 mmol/L (ref 101–111)
GFR calc Af Amer: >60 mL/min (ref >60)
GFR, EST NON AFRICAN AMERICAN: 59 mL/min — AB (ref >60)
GLUCOSE: 115 mg/dL — AB (ref 65–99)
POTASSIUM: 5 mmol/L (ref 3.5–5.1)
SODIUM: 134 mmol/L — AB (ref 135–145)
Total Bilirubin: 0.5 mg/dL (ref 0.3–1.2)
Total Protein: 6.6 g/dL (ref 6.5–8.1)

## 2017-01-23 LAB — CBC WITH DIFFERENTIAL/PLATELET
Basophils Absolute: 0.1 10*3/uL (ref 0–0.1)
Basophils Relative: 1 %
EOS PCT: 0 %
Eosinophils Absolute: 0 10*3/uL (ref 0–0.7)
HCT: 29.8 % — ABNORMAL LOW (ref 40.0–52.0)
HEMOGLOBIN: 9.7 g/dL — AB (ref 13.0–18.0)
LYMPHS ABS: 0.7 10*3/uL — AB (ref 1.0–3.6)
LYMPHS PCT: 5 %
MCH: 29.5 pg (ref 26.0–34.0)
MCHC: 32.6 g/dL (ref 32.0–36.0)
MCV: 90.6 fL (ref 80.0–100.0)
MONOS PCT: 7 %
Monocytes Absolute: 0.9 10*3/uL (ref 0.2–1.0)
Neutro Abs: 12.5 10*3/uL — ABNORMAL HIGH (ref 1.4–6.5)
Neutrophils Relative %: 87 %
PLATELETS: 381 10*3/uL (ref 150–440)
RBC: 3.29 MIL/uL — AB (ref 4.40–5.90)
RDW: 17.8 % — ABNORMAL HIGH (ref 11.5–14.5)
WBC: 14.2 10*3/uL — AB (ref 3.8–10.6)

## 2017-01-23 LAB — PSA: Prostatic Specific Antigen: 16.96 ng/mL — ABNORMAL HIGH (ref 0.00–4.00)

## 2017-01-23 MED ORDER — PEGFILGRASTIM 6 MG/0.6ML ~~LOC~~ PSKT
6.0000 mg | PREFILLED_SYRINGE | Freq: Once | SUBCUTANEOUS | Status: AC
  Administered 2017-01-23: 6 mg via SUBCUTANEOUS
  Filled 2017-01-23: qty 0.6

## 2017-01-23 MED ORDER — SODIUM CHLORIDE 0.9 % IV SOLN
67.5000 mg/m2 | Freq: Once | INTRAVENOUS | Status: AC
  Administered 2017-01-23: 140 mg via INTRAVENOUS
  Filled 2017-01-23: qty 14

## 2017-01-23 MED ORDER — SODIUM CHLORIDE 0.9 % IV SOLN
Freq: Once | INTRAVENOUS | Status: AC
  Administered 2017-01-23: 10:00:00 via INTRAVENOUS
  Filled 2017-01-23: qty 1000

## 2017-01-23 MED ORDER — DEXAMETHASONE SODIUM PHOSPHATE 10 MG/ML IJ SOLN
10.0000 mg | Freq: Once | INTRAMUSCULAR | Status: AC
  Administered 2017-01-23: 10 mg via INTRAVENOUS
  Filled 2017-01-23: qty 1

## 2017-01-24 ENCOUNTER — Telehealth: Payer: Self-pay | Admitting: *Deleted

## 2017-01-24 NOTE — Telephone Encounter (Signed)
Called pt with psa results, pt verbalized understanding of results.

## 2017-02-08 ENCOUNTER — Other Ambulatory Visit: Payer: Self-pay | Admitting: Oncology

## 2017-02-10 ENCOUNTER — Encounter
Admission: RE | Admit: 2017-02-10 | Discharge: 2017-02-10 | Disposition: A | Discharge status: Home / Self Care | Payer: Medicare Other | Source: Ambulatory Visit | Attending: Oncology | Admitting: Oncology

## 2017-02-10 ENCOUNTER — Ambulatory Visit
Admission: RE | Admit: 2017-02-10 | Discharge: 2017-02-10 | Disposition: A | Discharge status: Home / Self Care | Payer: Medicare Other | Source: Ambulatory Visit | Attending: Oncology | Admitting: Oncology

## 2017-02-10 DIAGNOSIS — K8020 Calculus of gallbladder without cholecystitis without obstruction: Secondary | ICD-10-CM | POA: Insufficient documentation

## 2017-02-10 DIAGNOSIS — C61 Malignant neoplasm of prostate: Secondary | ICD-10-CM | POA: Diagnosis present

## 2017-02-10 DIAGNOSIS — N400 Enlarged prostate without lower urinary tract symptoms: Secondary | ICD-10-CM | POA: Insufficient documentation

## 2017-02-10 DIAGNOSIS — I700 Atherosclerosis of aorta: Secondary | ICD-10-CM | POA: Diagnosis not present

## 2017-02-10 DIAGNOSIS — N2889 Other specified disorders of kidney and ureter: Secondary | ICD-10-CM | POA: Diagnosis not present

## 2017-02-10 DIAGNOSIS — N1330 Unspecified hydronephrosis: Secondary | ICD-10-CM | POA: Insufficient documentation

## 2017-02-10 DIAGNOSIS — I2510 Atherosclerotic heart disease of native coronary artery without angina pectoris: Secondary | ICD-10-CM | POA: Diagnosis not present

## 2017-02-10 MED ORDER — TECHNETIUM TC 99M MEDRONATE IV KIT
25.0000 | PACK | Freq: Once | INTRAVENOUS | Status: AC | PRN
  Administered 2017-02-10: 22.53 via INTRAVENOUS

## 2017-02-10 MED ORDER — IOPAMIDOL (ISOVUE-300) INJECTION 61%
100.0000 mL | Freq: Once | INTRAVENOUS | Status: AC | PRN
  Administered 2017-02-10: 100 mL via INTRAVENOUS

## 2017-02-13 ENCOUNTER — Inpatient Hospital Stay: Payer: Medicare Other | Attending: Oncology

## 2017-02-13 ENCOUNTER — Inpatient Hospital Stay: Payer: Medicare Other

## 2017-02-13 ENCOUNTER — Inpatient Hospital Stay (HOSPITAL_BASED_OUTPATIENT_CLINIC_OR_DEPARTMENT_OTHER): Payer: Medicare Other | Admitting: Oncology

## 2017-02-13 VITALS — BP 119/70 | HR 85 | Temp 97.4°F | Resp 18 | Wt 197.3 lb

## 2017-02-13 DIAGNOSIS — R530 Neoplastic (malignant) related fatigue: Secondary | ICD-10-CM

## 2017-02-13 DIAGNOSIS — Z5111 Encounter for antineoplastic chemotherapy: Secondary | ICD-10-CM | POA: Diagnosis present

## 2017-02-13 DIAGNOSIS — Z7984 Long term (current) use of oral hypoglycemic drugs: Secondary | ICD-10-CM | POA: Insufficient documentation

## 2017-02-13 DIAGNOSIS — C7951 Secondary malignant neoplasm of bone: Secondary | ICD-10-CM | POA: Insufficient documentation

## 2017-02-13 DIAGNOSIS — C786 Secondary malignant neoplasm of retroperitoneum and peritoneum: Secondary | ICD-10-CM | POA: Diagnosis not present

## 2017-02-13 DIAGNOSIS — M109 Gout, unspecified: Secondary | ICD-10-CM

## 2017-02-13 DIAGNOSIS — F17200 Nicotine dependence, unspecified, uncomplicated: Secondary | ICD-10-CM

## 2017-02-13 DIAGNOSIS — D649 Anemia, unspecified: Secondary | ICD-10-CM | POA: Diagnosis not present

## 2017-02-13 DIAGNOSIS — C61 Malignant neoplasm of prostate: Secondary | ICD-10-CM

## 2017-02-13 DIAGNOSIS — R531 Weakness: Secondary | ICD-10-CM | POA: Insufficient documentation

## 2017-02-13 DIAGNOSIS — Z79899 Other long term (current) drug therapy: Secondary | ICD-10-CM | POA: Insufficient documentation

## 2017-02-13 DIAGNOSIS — Z7982 Long term (current) use of aspirin: Secondary | ICD-10-CM | POA: Insufficient documentation

## 2017-02-13 DIAGNOSIS — K5900 Constipation, unspecified: Secondary | ICD-10-CM

## 2017-02-13 LAB — CBC WITH DIFFERENTIAL/PLATELET
BASOS ABS: 0 10*3/uL (ref 0–0.1)
Basophils Relative: 0 %
Eosinophils Absolute: 0 10*3/uL (ref 0–0.7)
Eosinophils Relative: 0 %
HCT: 29.8 % — ABNORMAL LOW (ref 40.0–52.0)
HEMOGLOBIN: 9.6 g/dL — AB (ref 13.0–18.0)
LYMPHS ABS: 0.8 10*3/uL — AB (ref 1.0–3.6)
LYMPHS PCT: 6 %
MCH: 29.4 pg (ref 26.0–34.0)
MCHC: 32.1 g/dL (ref 32.0–36.0)
MCV: 91.6 fL (ref 80.0–100.0)
Monocytes Absolute: 0.7 10*3/uL (ref 0.2–1.0)
Monocytes Relative: 5 %
NEUTROS ABS: 12.1 10*3/uL — AB (ref 1.4–6.5)
NEUTROS PCT: 89 %
PLATELETS: 347 10*3/uL (ref 150–440)
RBC: 3.26 MIL/uL — AB (ref 4.40–5.90)
RDW: 18.8 % — ABNORMAL HIGH (ref 11.5–14.5)
WBC: 13.7 10*3/uL — AB (ref 3.8–10.6)

## 2017-02-13 LAB — COMPREHENSIVE METABOLIC PANEL
ALBUMIN: 3.4 g/dL — AB (ref 3.5–5.0)
ALT: 16 U/L — AB (ref 17–63)
AST: 27 U/L (ref 15–41)
Alkaline Phosphatase: 60 U/L (ref 38–126)
Anion gap: 13 (ref 5–15)
BILIRUBIN TOTAL: 0.5 mg/dL (ref 0.3–1.2)
BUN: 28 mg/dL — AB (ref 6–20)
CO2: 18 mmol/L — ABNORMAL LOW (ref 22–32)
CREATININE: 1.12 mg/dL (ref 0.61–1.24)
Calcium: 8.3 mg/dL — ABNORMAL LOW (ref 8.9–10.3)
Chloride: 105 mmol/L (ref 101–111)
GFR calc Af Amer: >60 mL/min (ref >60)
GFR, EST NON AFRICAN AMERICAN: 60 mL/min — AB (ref >60)
GLUCOSE: 137 mg/dL — AB (ref 65–99)
POTASSIUM: 4.6 mmol/L (ref 3.5–5.1)
Sodium: 136 mmol/L (ref 135–145)
TOTAL PROTEIN: 7.1 g/dL (ref 6.5–8.1)

## 2017-02-13 LAB — PSA: Prostatic Specific Antigen: 18.99 ng/mL — ABNORMAL HIGH (ref 0.00–4.00)

## 2017-02-13 MED ORDER — DEXAMETHASONE SODIUM PHOSPHATE 10 MG/ML IJ SOLN
10.0000 mg | Freq: Once | INTRAMUSCULAR | Status: AC
  Administered 2017-02-13: 10 mg via INTRAVENOUS
  Filled 2017-02-13: qty 1

## 2017-02-13 MED ORDER — PEGFILGRASTIM 6 MG/0.6ML ~~LOC~~ PSKT
6.0000 mg | PREFILLED_SYRINGE | Freq: Once | SUBCUTANEOUS | Status: AC
  Administered 2017-02-13: 6 mg via SUBCUTANEOUS
  Filled 2017-02-13: qty 0.6

## 2017-02-13 MED ORDER — SODIUM CHLORIDE 0.9 % IV SOLN
3.5000 mg | Freq: Once | INTRAVENOUS | Status: AC
  Administered 2017-02-13: 3.5 mg via INTRAVENOUS
  Filled 2017-02-13: qty 4.38

## 2017-02-13 MED ORDER — SODIUM CHLORIDE 0.9 % IV SOLN
67.5000 mg/m2 | Freq: Once | INTRAVENOUS | Status: AC
  Administered 2017-02-13: 140 mg via INTRAVENOUS
  Filled 2017-02-13: qty 14

## 2017-02-13 MED ORDER — SODIUM CHLORIDE 0.9 % IV SOLN
Freq: Once | INTRAVENOUS | Status: AC
  Administered 2017-02-13: 11:00:00 via INTRAVENOUS
  Filled 2017-02-13: qty 1000

## 2017-02-13 NOTE — Progress Notes (Signed)
Patient denies any concerns today.  

## 2017-02-13 NOTE — Progress Notes (Signed)
[place]  Telephone:(336) [phone]  Fax:(336) [place] DOB: 18-May-1933  MR#: [id number]  MVH#:[id number]  Patient Care Team: System, Pcp Not In as PCP - General   CHIEF COMPLAINT: Prostate cancer with retroperitoneal adenopathy.  INTERVAL HISTORY: Patient returns to clinic today for further evaluation and consideration of cycle 5 of dose reduced Taxotere.  He continues to have weakness and fatigue but overall feels well.  He admits to a gout flare in the left great toe.  He otherwise denies any bony pain.  He has no neurological complaints.  He denies any recent fevers or illnesses.  He denies any chest pain or shortness of breath he does not denies any vomiting, constipation or diarrhea.  He has no urinary complaints.  Patient offers no additional complaints today.  He is accompanied by both of his daughters today and they are interested in most recent imaging results.   REVIEW OF SYSTEMS:   Review of Systems  Constitutional: Positive for malaise/fatigue. Negative for diaphoresis, fever and weight loss.  Respiratory: Negative.  Negative for cough and shortness of breath.   Cardiovascular: Negative.  Negative for chest pain and leg swelling.  Gastrointestinal: Negative for abdominal pain, constipation, nausea and vomiting.  Genitourinary: Negative.  Negative for flank pain and frequency.  Musculoskeletal: Positive for joint pain (Left great toe- Gouty ).  Skin: Negative.  Negative for rash.  Neurological: Positive for weakness. Negative for dizziness and sensory change.  Psychiatric/Behavioral: Negative.  The patient is not nervous/anxious.     As per HPI. Otherwise, a complete review of systems is negative.  ONCOLOGY HISTORY: Oncology History   1. prostate cancer, extra capsule extention, also illiac and retroperitoneal adenopathy on ct, small lesion liver possible metastatic, minimal uptake one area t spine. Asymptomatic. Not a surgical  candidate with CT findings. Considdered not bulk and not visceral disease. Psa 13.6 prior to tx...casodex , then lupron first dose 12/31     Primary prostate cancer with metastasis from prostate to other site [facility])    PAST MEDICAL HISTORY: Past Medical History:  Diagnosis Date  . Cancer of prostate ([place]) 07/13/2014  . Hypertension     PAST SURGICAL HISTORY: No past surgical history on file.  FAMILY HISTORY: Reviewed and unchanged. No reported history of malignancy or chronic disease.    ADVANCED DIRECTIVES:    HEALTH MAINTENANCE: Social History   Tobacco Use  . Smoking status: Current Every Day Smoker  . Smokeless tobacco: Never Used  Substance Use Topics  . Alcohol use: Yes    Alcohol/week: 0.0 oz  . Drug use: No     No Known Allergies  Current Outpatient Medications  Medication Sig Dispense Refill  . aspirin 325 MG EC tablet Take 325 mg by mouth daily.    . fluvastatin (LESCOL) 20 MG capsule Take 20 mg by mouth at bedtime.    . lidocaine-prilocaine (EMLA) cream Apply to affected area once 30 g 3  . lisinopril-hydrochlorothiazide (PRINZIDE,ZESTORETIC) 20-12.5 MG tablet     . megestrol (MEGACE) 40 MG tablet Take 1 tablet (40 mg total) daily by mouth. 30 tablet 1  . metFORMIN (GLUCOPHAGE) 500 MG tablet 500 mg daily at 6 (six) AM.     . Omega-3 Fatty Acids (FISH OIL) 1000 MG CAPS Take by mouth 4 (four) times daily.    . ondansetron (ZOFRAN) 8 MG tablet Take 1 tablet (8 mg total) 2 (two) times daily as needed by mouth for refractory  nausea / vomiting. 30 tablet 2  . prochlorperazine (COMPAZINE) 10 MG tablet Take 1 tablet (10 mg total) every 6 (six) hours as needed by mouth (Nausea or vomiting). 60 tablet 2  . tamsulosin (FLOMAX) 0.4 MG CAPS capsule Take 0.4 mg by mouth 2 (two) times daily.     No current facility-administered medications for this visit.    Facility-Administered Medications Ordered in Other Visits  Medication Dose Route Frequency Provider Last Rate Last  Dose  . ondansetron (ZOFRAN) injection 8 mg  8 mg Intravenous Once Lloyd Huger, MD        OBJECTIVE: BP 119/70   Pulse 85   Temp (!) 97.4 F (36.3 C) (Tympanic)   Resp 18   Wt 197 lb 4.8 oz (89.5 kg)   BMI 30.00 kg/m    Body mass index is 30 kg/m.    ECOG FS:0 - Asymptomatic  General: Well-developed, well-nourished, no acute distress. Eyes: Pink conjunctiva, anicteric sclera. Lungs: Clear to auscultation bilaterally. Heart: Regular rate and rhythm. No rubs, murmurs, or gallops. Musculoskeletal: Edematous left great toe.  Hot to touch.  No cyanosis, or clubbing.  Neuro: Alert, answering all questions appropriately. Cranial nerves grossly intact. Skin: No rashes or petechiae noted. Psych: Normal affect.   LAB RESULTS:  Appointment on 02/13/2017  Component Date Value Ref Range Status  . WBC 02/13/2017 13.7* 3.8 - 10.6 K/uL Final  . RBC 02/13/2017 3.26* 4.40 - 5.90 MIL/uL Final  . Hemoglobin 02/13/2017 9.6* 13.0 - 18.0 g/dL Final  . HCT 02/13/2017 29.8* 40.0 - 52.0 % Final  . MCV 02/13/2017 91.6  80.0 - 100.0 fL Final  . MCH 02/13/2017 29.4  26.0 - 34.0 pg Final  . MCHC 02/13/2017 32.1  32.0 - 36.0 g/dL Final  . RDW 02/13/2017 18.8* 11.5 - 14.5 % Final  . Platelets 02/13/2017 347  150 - 440 K/uL Final  . Neutrophils Relative % 02/13/2017 89  % Final  . Neutro Abs 02/13/2017 12.1* 1.4 - 6.5 K/uL Final  . Lymphocytes Relative 02/13/2017 6  % Final  . Lymphs Abs 02/13/2017 0.8* 1.0 - 3.6 K/uL Final  . Monocytes Relative 02/13/2017 5  % Final  . Monocytes Absolute 02/13/2017 0.7  0.2 - 1.0 K/uL Final  . Eosinophils Relative 02/13/2017 0  % Final  . Eosinophils Absolute 02/13/2017 0.0  0 - 0.7 K/uL Final  . Basophils Relative 02/13/2017 0  % Final  . Basophils Absolute 02/13/2017 0.0  0 - 0.1 K/uL Final   Performed at [facility], [address], [place], [place] [address]  . Prostatic Specific Antigen 02/13/2017 18.99* 0.00 - 4.00 ng/mL Final   Comment:  (NOTE) While PSA levels of <=4.0 ng/ml are reported as reference range, some men with levels below 4.0 ng/ml can have prostate cancer and many men with PSA above 4.0 ng/ml do not have prostate cancer.  Other tests such as free PSA, age specific reference ranges, PSA velocity and PSA doubling time may be helpful especially in men less than 64 years old. Performed at [facility], [place] [address] [place], [place] [address]   . Sodium 02/13/2017 136  135 - 145 mmol/L Final  . Potassium 02/13/2017 4.6  3.5 - 5.1 mmol/L Final  . Chloride 02/13/2017 105  101 - 111 mmol/L Final  . CO2 02/13/2017 18* 22 - 32 mmol/L Final  . Glucose, Bld 02/13/2017 137* 65 - 99 mg/dL Final  . BUN 02/13/2017 28* 6 - 20 mg/dL Final  .  Creatinine, Ser 02/13/2017 1.12  0.61 - 1.24 mg/dL Final  . Calcium 02/13/2017 8.3* 8.9 - 10.3 mg/dL Final  . Total Protein 02/13/2017 7.1  6.5 - 8.1 g/dL Final  . Albumin 02/13/2017 3.4* 3.5 - 5.0 g/dL Final  . AST 02/13/2017 27  15 - 41 U/L Final  . ALT 02/13/2017 16* 17 - 63 U/L Final  . Alkaline Phosphatase 02/13/2017 60  38 - 126 U/L Final  . Total Bilirubin 02/13/2017 0.5  0.3 - 1.2 mg/dL Final  . GFR calc non Af Amer 02/13/2017 60* >60 mL/min Final  . GFR calc Af Amer 02/13/2017 >60  >60 mL/min Final   Comment: (NOTE) The eGFR has been calculated using the CKD EPI equation. This calculation has not been validated in all clinical situations. eGFR's persistently <60 mL/min signify possible Chronic Kidney Disease.   Georgiann Hahn gap 02/13/2017 13  5 - 15 Final   Performed at [facility], [address] [place], [place] [address]     STUDIES: No results found.  ASSESSMENT:  Prostate cancer with retroperitoneal adenopathy.  PLAN:     1. Prostate cancer with retroperitoneal adenopathy: Patient's PSA remains elevated but continues to trend down.  Most recent PSA is 18.99.  Reviewed scans with patient and family in detail.  Most recent CT /NM scan from  02/10/2017 reveals overall stable disease. NM skeletal survey: Overall minimal change of metastatic prostate cancer involving the pelvis, ribs and calvarium. Potential increase in activity of lesions anteromedial LEFT seventh rib. CT: Slight enlargement of an exophytic mass off the prostate, invading the bladder and resulting in severe left hydronephrosis, as before. Probable right rib metastases, as noted before.  Proceed with cycle 5 of dose reduced Taxotere and Neulasta support today.  Patient will also receive Zometa today and with every odd cycle.  Return to clinic in 3 weeks fo reviewed scans with patient and family r further evaluation and consideration of cycle 6. 2. Chronic renal insufficiency: Patient's creatinine is now within normal limits. 3.  Constipation: Continue OTC treatments.   4.  Hypotension: Stable today. 5.  Poor appetite: Continue Megace.  Stable. 6.  Anemia: Mild, monitor.  9.6 today.  Okay for treatment.  Patient expressed understanding and was in agreement with this plan. He also understands that He can call clinic at any time with any questions, concerns, or complaints.    Jacquelin Hawking, NP   04/26/2015 4:01 PM

## 2017-03-02 NOTE — Progress Notes (Signed)
[place]  [phone]  Fax:([phone]) [place] DOB: 1933/03/23  MR#: [id number]  ECX#:[id number]  Patient Care Team: System, Pcp Not In as PCP - General   CHIEF COMPLAINT: Prostate cancer with retroperitoneal adenopathy.  INTERVAL HISTORY:  Patient returns to clinic today for further evaluation and consideration of cycle 6 of dose reduced Taxotere.  He continues to have weakness and fatigue, but otherwise feels well.  He has a decreased appetite, but attributes this to poor taste.  He has occasional back pain and states his left leg "occasionally just gives out".  He denies any bony pain. He has no neurologic complaints. He denies any recent fevers or illnesses. He denies any chest pain or shortness of breath. He denies any vomiting, constipation, or diarrhea. He has no urinary complaints. Patient offers no further specific complaints today.   REVIEW OF SYSTEMS:   Review of Systems  Constitutional: Positive for malaise/fatigue. Negative for diaphoresis, fever and weight loss.  Respiratory: Negative.  Negative for cough and shortness of breath.   Cardiovascular: Negative.  Negative for chest pain and leg swelling.  Gastrointestinal: Positive for nausea. Negative for abdominal pain, constipation and vomiting.  Genitourinary: Negative.  Negative for flank pain and frequency.  Musculoskeletal: Positive for back pain. Negative for falls.  Skin: Negative.  Negative for rash.  Neurological: Positive for weakness. Negative for dizziness and sensory change.  Psychiatric/Behavioral: Negative.  The patient is not nervous/anxious.     As per HPI. Otherwise, a complete review of systems is negative.  ONCOLOGY HISTORY: Oncology History   1. prostate cancer, extra capsule extention, also illiac and retroperitoneal adenopathy on ct, small lesion liver possible metastatic, minimal uptake one area t spine. Asymptomatic. Not a surgical candidate with CT  findings. Considdered not bulk and not visceral disease. Psa 13.6 prior to tx...casodex , then lupron first dose 12/31     Primary prostate cancer with metastasis from prostate to other site [facility])    PAST MEDICAL HISTORY: Past Medical History:  Diagnosis Date  . Cancer of prostate ([place]) 07/13/2014  . Hypertension     PAST SURGICAL HISTORY: No past surgical history on file.  FAMILY HISTORY: Reviewed and unchanged. No reported history of malignancy or chronic disease.    ADVANCED DIRECTIVES:    HEALTH MAINTENANCE: Social History   Tobacco Use  . Smoking status: Current Every Day Smoker  . Smokeless tobacco: Never Used  Substance Use Topics  . Alcohol use: Yes    Alcohol/week: 0.0 oz  . Drug use: No     No Known Allergies  Current Outpatient Medications  Medication Sig Dispense Refill  . aspirin 325 MG EC tablet Take 325 mg by mouth daily.    . fluvastatin (LESCOL) 20 MG capsule Take 20 mg by mouth at bedtime.    . lidocaine-prilocaine (EMLA) cream Apply to affected area once 30 g 3  . Omega-3 Fatty Acids (FISH OIL) 1000 MG CAPS Take by mouth 4 (four) times daily.    . tamsulosin (FLOMAX) 0.4 MG CAPS capsule Take 0.4 mg by mouth 2 (two) times daily.    . megestrol (MEGACE) 40 MG tablet Take 1 tablet (40 mg total) daily by mouth. (Patient not taking: Reported on 03/06/2017) 30 tablet 1  . metFORMIN (GLUCOPHAGE) 500 MG tablet 500 mg daily at 6 (six) AM.     . ondansetron (ZOFRAN) 8 MG tablet Take 1 tablet (8 mg total) 2 (two) times daily as  needed by mouth for refractory nausea / vomiting. (Patient not taking: Reported on 03/06/2017) 30 tablet 2  . prochlorperazine (COMPAZINE) 10 MG tablet Take 1 tablet (10 mg total) every 6 (six) hours as needed by mouth (Nausea or vomiting). (Patient not taking: Reported on 03/06/2017) 60 tablet 2   No current facility-administered medications for this visit.    Facility-Administered Medications Ordered in Other Visits  Medication Dose  Route Frequency Provider Last Rate Last Dose  . ondansetron (ZOFRAN) injection 8 mg  8 mg Intravenous Once Lloyd Huger, MD        OBJECTIVE: BP (!) 96/59 (BP Location: Left Arm, Patient Position: Sitting)   Pulse (!) 106   Temp 97.6 F (36.4 C) (Tympanic)   Resp 20   Wt 196 lb 2 oz (89 kg)   BMI 29.82 kg/m    Body mass index is 29.82 kg/m.    ECOG FS:0 - Asymptomatic  General: Well-developed, well-nourished, no acute distress. Eyes: Pink conjunctiva, anicteric sclera. Lungs: Clear to auscultation bilaterally. Heart: Regular rate and rhythm. No rubs, murmurs, or gallops. Musculoskeletal: No edema, cyanosis, or clubbing.  Neuro: Alert, answering all questions appropriately. Cranial nerves grossly intact. Skin: No rashes or petechiae noted. Psych: Normal affect.   LAB RESULTS:  Appointment on 03/06/2017  Component Date Value Ref Range Status  . Prostatic Specific Antigen 03/06/2017 18.62* 0.00 - 4.00 ng/mL Final   Comment: (NOTE) While PSA levels of <=4.0 ng/ml are reported as reference range, some men with levels below 4.0 ng/ml can have prostate cancer and many men with PSA above 4.0 ng/ml do not have prostate cancer.  Other tests such as free PSA, age specific reference ranges, PSA velocity and PSA doubling time may be helpful especially in men less than 47 years old. Performed at [facility], [place] [address], [place], [place] [address]   . Sodium 03/06/2017 135  135 - 145 mmol/L Final  . Potassium 03/06/2017 4.3  3.5 - 5.1 mmol/L Final  . Chloride 03/06/2017 106  101 - 111 mmol/L Final  . CO2 03/06/2017 20* 22 - 32 mmol/L Final  . Glucose, Bld 03/06/2017 151* 65 - 99 mg/dL Final  . BUN 03/06/2017 19  6 - 20 mg/dL Final  . Creatinine, Ser 03/06/2017 1.21  0.61 - 1.24 mg/dL Final  . Calcium 03/06/2017 8.5* 8.9 - 10.3 mg/dL Final  . Total Protein 03/06/2017 6.4* 6.5 - 8.1 g/dL Final  . Albumin 03/06/2017 2.9* 3.5 - 5.0 g/dL Final  . AST 03/06/2017 21  15  - 41 U/L Final  . ALT 03/06/2017 16* 17 - 63 U/L Final  . Alkaline Phosphatase 03/06/2017 74  38 - 126 U/L Final  . Total Bilirubin 03/06/2017 0.3  0.3 - 1.2 mg/dL Final  . GFR calc non Af Amer 03/06/2017 54* >60 mL/min Final  . GFR calc Af Amer 03/06/2017 >60  >60 mL/min Final   Comment: (NOTE) The eGFR has been calculated using the CKD EPI equation. This calculation has not been validated in all clinical situations. eGFR's persistently <60 mL/min signify possible Chronic Kidney Disease.   Georgiann Hahn gap 03/06/2017 9  5 - 15 Final   Performed at [facility], [address] [place], [place] [address]  . WBC 03/06/2017 7.4  3.8 - 10.6 K/uL Final  . RBC 03/06/2017 2.87* 4.40 - 5.90 MIL/uL Final  . Hemoglobin 03/06/2017 8.8* 13.0 - 18.0 g/dL Final  . HCT 03/06/2017 26.3* 40.0 - 52.0 % Final  .  MCV 03/06/2017 91.5  80.0 - 100.0 fL Final  . MCH 03/06/2017 30.6  26.0 - 34.0 pg Final  . MCHC 03/06/2017 33.4  32.0 - 36.0 g/dL Final  . RDW 03/06/2017 18.5* 11.5 - 14.5 % Final  . Platelets 03/06/2017 414  150 - 440 K/uL Final  . Neutrophils Relative % 03/06/2017 77  % Final  . Neutro Abs 03/06/2017 5.7  1.4 - 6.5 K/uL Final  . Lymphocytes Relative 03/06/2017 12  % Final  . Lymphs Abs 03/06/2017 0.9* 1.0 - 3.6 K/uL Final  . Monocytes Relative 03/06/2017 9  % Final  . Monocytes Absolute 03/06/2017 0.6  0.2 - 1.0 K/uL Final  . Eosinophils Relative 03/06/2017 1  % Final  . Eosinophils Absolute 03/06/2017 0.1  0 - 0.7 K/uL Final  . Basophils Relative 03/06/2017 1  % Final  . Basophils Absolute 03/06/2017 0.1  0 - 0.1 K/uL Final   Performed at [facility], [address] [place], [place] [address]     STUDIES: No results found.  ASSESSMENT:  Prostate cancer with retroperitoneal adenopathy.  PLAN:     1. Prostate cancer with retroperitoneal adenopathy: Patient's PSA appears to be slowly trending up and is now 18.62.  CT scan and nuclear med bone scan from February 10, 2017  essentially revealed equivocal disease.  Proceed with cycle 6 of dose reduce Taxotere and Neulasta support today.  Patient will also receive Zometa on the odd-numbered cycles for his bony disease.  Will reimage in 1 month to assess for interval change.  If imaging is improved, will continue with Taxotere.  If patient has worsening disease and PSA continues to trend up, will consider switching treatments to either cabazitaxel or to [facility].  (Patient previously on Xtandi)   2. Chronic renal insufficiency: Patient's creatinine is now within normal limits. 3.  Constipation: Continue OTC treatments.   4.  Hypotension: Patient's blood pressure is now within normal limits.  He does not require additional IV fluids today.   5.  Poor appetite: Continue Megace, patient has declined dietary referral. 6.  Anemia: Mild, monitor. 7.  Back pain: Repeat imaging as above.  Patient expressed understanding and was in agreement with this plan. He also understands that He can call clinic at any time with any questions, concerns, or complaints.    Lloyd Huger, MD   04/26/2015 7:49 AM

## 2017-03-06 ENCOUNTER — Inpatient Hospital Stay (HOSPITAL_BASED_OUTPATIENT_CLINIC_OR_DEPARTMENT_OTHER): Payer: Medicare Other | Admitting: Oncology

## 2017-03-06 ENCOUNTER — Inpatient Hospital Stay: Payer: Medicare Other

## 2017-03-06 VITALS — BP 122/74 | HR 93

## 2017-03-06 VITALS — BP 96/59 | HR 106 | Temp 97.6°F | Resp 20 | Wt 196.1 lb

## 2017-03-06 DIAGNOSIS — C7951 Secondary malignant neoplasm of bone: Secondary | ICD-10-CM

## 2017-03-06 DIAGNOSIS — C61 Malignant neoplasm of prostate: Secondary | ICD-10-CM

## 2017-03-06 DIAGNOSIS — R530 Neoplastic (malignant) related fatigue: Secondary | ICD-10-CM | POA: Diagnosis not present

## 2017-03-06 DIAGNOSIS — K5900 Constipation, unspecified: Secondary | ICD-10-CM

## 2017-03-06 DIAGNOSIS — M545 Low back pain: Secondary | ICD-10-CM

## 2017-03-06 DIAGNOSIS — C786 Secondary malignant neoplasm of retroperitoneum and peritoneum: Secondary | ICD-10-CM

## 2017-03-06 DIAGNOSIS — R531 Weakness: Secondary | ICD-10-CM | POA: Diagnosis not present

## 2017-03-06 DIAGNOSIS — D649 Anemia, unspecified: Secondary | ICD-10-CM

## 2017-03-06 DIAGNOSIS — Z5111 Encounter for antineoplastic chemotherapy: Secondary | ICD-10-CM | POA: Diagnosis not present

## 2017-03-06 DIAGNOSIS — E46 Unspecified protein-calorie malnutrition: Secondary | ICD-10-CM | POA: Diagnosis not present

## 2017-03-06 LAB — CBC WITH DIFFERENTIAL/PLATELET
BASOS PCT: 1 %
Basophils Absolute: 0.1 10*3/uL (ref 0–0.1)
EOS ABS: 0.1 10*3/uL (ref 0–0.7)
Eosinophils Relative: 1 %
HCT: 26.3 % — ABNORMAL LOW (ref 40.0–52.0)
Hemoglobin: 8.8 g/dL — ABNORMAL LOW (ref 13.0–18.0)
Lymphocytes Relative: 12 %
Lymphs Abs: 0.9 10*3/uL — ABNORMAL LOW (ref 1.0–3.6)
MCH: 30.6 pg (ref 26.0–34.0)
MCHC: 33.4 g/dL (ref 32.0–36.0)
MCV: 91.5 fL (ref 80.0–100.0)
MONO ABS: 0.6 10*3/uL (ref 0.2–1.0)
MONOS PCT: 9 %
Neutro Abs: 5.7 10*3/uL (ref 1.4–6.5)
Neutrophils Relative %: 77 %
PLATELETS: 414 10*3/uL (ref 150–440)
RBC: 2.87 MIL/uL — ABNORMAL LOW (ref 4.40–5.90)
RDW: 18.5 % — AB (ref 11.5–14.5)
WBC: 7.4 10*3/uL (ref 3.8–10.6)

## 2017-03-06 LAB — COMPREHENSIVE METABOLIC PANEL
ALBUMIN: 2.9 g/dL — AB (ref 3.5–5.0)
ALK PHOS: 74 U/L (ref 38–126)
ALT: 16 U/L — AB (ref 17–63)
AST: 21 U/L (ref 15–41)
Anion gap: 9 (ref 5–15)
BILIRUBIN TOTAL: 0.3 mg/dL (ref 0.3–1.2)
BUN: 19 mg/dL (ref 6–20)
CALCIUM: 8.5 mg/dL — AB (ref 8.9–10.3)
CO2: 20 mmol/L — AB (ref 22–32)
CREATININE: 1.21 mg/dL (ref 0.61–1.24)
Chloride: 106 mmol/L (ref 101–111)
GFR calc non Af Amer: 54 mL/min — ABNORMAL LOW (ref >60)
GLUCOSE: 151 mg/dL — AB (ref 65–99)
Potassium: 4.3 mmol/L (ref 3.5–5.1)
SODIUM: 135 mmol/L (ref 135–145)
Total Protein: 6.4 g/dL — ABNORMAL LOW (ref 6.5–8.1)

## 2017-03-06 LAB — PSA: Prostatic Specific Antigen: 18.62 ng/mL — ABNORMAL HIGH (ref 0.00–4.00)

## 2017-03-06 MED ORDER — DEXAMETHASONE SODIUM PHOSPHATE 10 MG/ML IJ SOLN
10.0000 mg | Freq: Once | INTRAMUSCULAR | Status: AC
  Administered 2017-03-06: 10 mg via INTRAVENOUS
  Filled 2017-03-06: qty 1

## 2017-03-06 MED ORDER — SODIUM CHLORIDE 0.9 % IV SOLN
Freq: Once | INTRAVENOUS | Status: AC
  Administered 2017-03-06: 11:00:00 via INTRAVENOUS
  Filled 2017-03-06: qty 1000

## 2017-03-06 MED ORDER — DOCETAXEL CHEMO INJECTION 160 MG/16ML
67.5000 mg/m2 | Freq: Once | INTRAVENOUS | Status: AC
  Administered 2017-03-06: 140 mg via INTRAVENOUS
  Filled 2017-03-06: qty 14

## 2017-03-06 MED ORDER — PEGFILGRASTIM 6 MG/0.6ML ~~LOC~~ PSKT
6.0000 mg | PREFILLED_SYRINGE | Freq: Once | SUBCUTANEOUS | Status: AC
  Administered 2017-03-06: 6 mg via SUBCUTANEOUS
  Filled 2017-03-06: qty 0.6

## 2017-03-06 NOTE — Progress Notes (Signed)
Pt in for follow up today.  Reports decreased appetite due to "metallic taste".  Having some intermittent pain in lower back as well as "left leg just gives out ".  Pt is very pale this morning, family states "worse then usual".

## 2017-03-07 ENCOUNTER — Telehealth: Payer: Self-pay | Admitting: *Deleted

## 2017-03-07 NOTE — Telephone Encounter (Signed)
Left vm for pt regarding psa resuls.

## 2017-03-30 NOTE — Progress Notes (Signed)
[place]  [phone]  Fax:(336) [place] DOB: 07-25-1933  MR#: [id number]  JYN#:[id number]  Patient Care Team: Joyice Faster, FNP as PCP - General (Family Medicine)   CHIEF COMPLAINT: Prostate cancer with retroperitoneal adenopathy.  INTERVAL HISTORY:  Patient returns to clinic today for further evaluation and discussion of his imaging results.  He currently feels well.  He has a decreased appetite, but attributes this to poor taste.  He has occasional back pain.  He denies any bony pain. He has no neurologic complaints. He denies any recent fevers or illnesses. He denies any chest pain or shortness of breath. He denies any vomiting, constipation, or diarrhea. He has no urinary complaints. Patient offers no further specific complaints today.   REVIEW OF SYSTEMS:   Review of Systems  Constitutional: Positive for malaise/fatigue. Negative for diaphoresis, fever and weight loss.  Respiratory: Negative.  Negative for cough and shortness of breath.   Cardiovascular: Negative.  Negative for chest pain and leg swelling.  Gastrointestinal: Positive for nausea. Negative for abdominal pain, constipation and vomiting.  Genitourinary: Negative.  Negative for flank pain and frequency.  Musculoskeletal: Positive for back pain. Negative for falls.  Skin: Negative.  Negative for rash.  Neurological: Positive for weakness. Negative for dizziness and sensory change.  Psychiatric/Behavioral: Negative.  The patient is not nervous/anxious.     As per HPI. Otherwise, a complete review of systems is negative.  ONCOLOGY HISTORY: Oncology History   1. prostate cancer, extra capsule extention, also illiac and retroperitoneal adenopathy on ct, small lesion liver possible metastatic, minimal uptake one area t spine. Asymptomatic. Not a surgical candidate with CT findings. Considdered not bulk and not visceral disease. Psa 13.6 prior to tx...casodex ,  then lupron first dose 12/31     Primary prostate cancer with metastasis from prostate to other site [facility])    PAST MEDICAL HISTORY: Past Medical History:  Diagnosis Date  . Cancer of prostate ([place]) 07/13/2014  . Hypertension     PAST SURGICAL HISTORY: History reviewed. No pertinent surgical history.  FAMILY HISTORY: Reviewed and unchanged. No reported history of malignancy or chronic disease.    ADVANCED DIRECTIVES:    HEALTH MAINTENANCE: Social History   Tobacco Use  . Smoking status: Current Every Day Smoker  . Smokeless tobacco: Never Used  Substance Use Topics  . Alcohol use: Yes    Alcohol/week: 0.0 oz  . Drug use: No     No Known Allergies  Current Outpatient Medications  Medication Sig Dispense Refill  . aspirin 325 MG EC tablet Take 325 mg by mouth daily.    . Omega-3 Fatty Acids (FISH OIL) 1000 MG CAPS Take by mouth 4 (four) times daily.    . tamsulosin (FLOMAX) 0.4 MG CAPS capsule Take 0.4 mg by mouth 2 (two) times daily.    . fluvastatin (LESCOL) 20 MG capsule Take 20 mg by mouth at bedtime.    . megestrol (MEGACE) 40 MG tablet Take 1 tablet (40 mg total) daily by mouth. (Patient not taking: Reported on 03/06/2017) 30 tablet 1  . ondansetron (ZOFRAN) 8 MG tablet Take 1 tablet (8 mg total) 2 (two) times daily as needed by mouth for refractory nausea / vomiting. (Patient not taking: Reported on 04/03/2017) 30 tablet 2  . prochlorperazine (COMPAZINE) 10 MG tablet Take 1 tablet (10 mg total) every 6 (six) hours as needed by mouth (Nausea or vomiting). (Patient not taking: Reported on 03/06/2017)  60 tablet 2   No current facility-administered medications for this visit.    Facility-Administered Medications Ordered in Other Visits  Medication Dose Route Frequency Provider Last Rate Last Dose  . ondansetron (ZOFRAN) injection 8 mg  8 mg Intravenous Once Lloyd Huger, MD        OBJECTIVE: BP 115/68   Pulse 100   Temp (!) 97.5 F (36.4 C) (Tympanic)    Resp 20   Wt 190 lb 6.4 oz (86.4 kg)   BMI 28.95 kg/m    Body mass index is 28.95 kg/m.    ECOG FS:0 - Asymptomatic  General: Well-developed, well-nourished, no acute distress. Eyes: Pink conjunctiva, anicteric sclera. Lungs: Clear to auscultation bilaterally. Heart: Regular rate and rhythm. No rubs, murmurs, or gallops. Musculoskeletal: No edema, cyanosis, or clubbing.  Neuro: Alert, answering all questions appropriately. Cranial nerves grossly intact. Skin: No rashes or petechiae noted. Psych: Normal affect.   LAB RESULTS:  Appointment on 04/01/2017  Component Date Value Ref Range Status  . Prostatic Specific Antigen 04/01/2017 15.32* 0.00 - 4.00 ng/mL Final   Comment: (NOTE) While PSA levels of <=4.0 ng/ml are reported as reference range, some men with levels below 4.0 ng/ml can have prostate cancer and many men with PSA above 4.0 ng/ml do not have prostate cancer.  Other tests such as free PSA, age specific reference ranges, PSA velocity and PSA doubling time may be helpful especially in men less than 44 years old. Performed at [facility], [place] [address] [place], [place] [address]   . Sodium 04/01/2017 137  135 - 145 mmol/L Final  . Potassium 04/01/2017 4.7  3.5 - 5.1 mmol/L Final  . Chloride 04/01/2017 108  101 - 111 mmol/L Final  . CO2 04/01/2017 22  22 - 32 mmol/L Final  . Glucose, Bld 04/01/2017 101* 65 - 99 mg/dL Final  . BUN 04/01/2017 16  6 - 20 mg/dL Final  . Creatinine, Ser 04/01/2017 0.91  0.61 - 1.24 mg/dL Final  . Calcium 04/01/2017 8.8* 8.9 - 10.3 mg/dL Final  . Total Protein 04/01/2017 6.4* 6.5 - 8.1 g/dL Final  . Albumin 04/01/2017 3.2* 3.5 - 5.0 g/dL Final  . AST 04/01/2017 20  15 - 41 U/L Final  . ALT 04/01/2017 16* 17 - 63 U/L Final  . Alkaline Phosphatase 04/01/2017 60  38 - 126 U/L Final  . Total Bilirubin 04/01/2017 0.3  0.3 - 1.2 mg/dL Final  . GFR calc non Af Amer 04/01/2017 >60  >60 mL/min Final  . GFR calc Af Amer 04/01/2017 >60  >60  mL/min Final   Comment: (NOTE) The eGFR has been calculated using the CKD EPI equation. This calculation has not been validated in all clinical situations. eGFR's persistently <60 mL/min signify possible Chronic Kidney Disease.   Georgiann Hahn gap 04/01/2017 7  5 - 15 Final   Performed at [facility], [address] [place],  [address]  . WBC 04/01/2017 7.9  3.8 - 10.6 K/uL Final  . RBC 04/01/2017 3.23* 4.40 - 5.90 MIL/uL Final  . Hemoglobin 04/01/2017 10.0* 13.0 - 18.0 g/dL Final  . HCT 04/01/2017 30.3* 40.0 - 52.0 % Final  . MCV 04/01/2017 93.9  80.0 - 100.0 fL Final  . MCH 04/01/2017 31.0  26.0 - 34.0 pg Final  . MCHC 04/01/2017 33.0  32.0 - 36.0 g/dL Final  . RDW 04/01/2017 18.2* 11.5 - 14.5 % Final  . Platelets 04/01/2017 251  150 - 440 K/uL Final  .  Neutrophils Relative % 04/01/2017 72  % Final  . Neutro Abs 04/01/2017 5.7  1.4 - 6.5 K/uL Final  . Lymphocytes Relative 04/01/2017 17  % Final  . Lymphs Abs 04/01/2017 1.3  1.0 - 3.6 K/uL Final  . Monocytes Relative 04/01/2017 8  % Final  . Monocytes Absolute 04/01/2017 0.6  0.2 - 1.0 K/uL Final  . Eosinophils Relative 04/01/2017 2  % Final  . Eosinophils Absolute 04/01/2017 0.2  0 - 0.7 K/uL Final  . Basophils Relative 04/01/2017 1  % Final  . Basophils Absolute 04/01/2017 0.1  0 - 0.1 K/uL Final   Performed at [facility], [address] [place], [place] [address]     STUDIES: No results found.  ASSESSMENT:  Prostate cancer with retroperitoneal adenopathy.  PLAN:     1. Prostate cancer with retroperitoneal adenopathy: CT scan and nuclear med bone scan from April 01, 2017 essentially revealed stable disease.  Patient's PSA has trended down and is now 15.32.  After lengthy discussion with the patient to discuss his treatment options which included continuing Taxotere, switching to cabazitaxel, or Zytiga (patient previously on Xtandi) patient has decided he does not wish to pursue additional chemotherapy at  this time and will try Zytiga 1000 mg/day along with 5 mg prednisone twice daily.  Continue monthly Zometa.  Return to clinic in 4 weeks to assess his toleration of Zytiga and continuation of Zometa.   2. Chronic renal insufficiency: Patient's creatinine is now within normal limits. 3.  Constipation: Continue OTC treatments.   4.  Poor appetite: Continue Megace, patient has declined dietary referral. 5.  Anemia: Mild, monitor. 6.  Back pain: Repeat imaging as above.  Approximately 30 minutes was spent in discussion of which greater than 50% was consultation.  Patient expressed understanding and was in agreement with this plan. He also understands that He can call clinic at any time with any questions, concerns, or complaints.    Lloyd Huger, MD   04/26/2015 10:18 AM

## 2017-04-01 ENCOUNTER — Encounter
Admission: RE | Admit: 2017-04-01 | Discharge: 2017-04-01 | Disposition: A | Discharge status: Home / Self Care | Payer: Medicare Other | Source: Ambulatory Visit | Attending: Oncology | Admitting: Oncology

## 2017-04-01 ENCOUNTER — Inpatient Hospital Stay: Payer: Medicare Other | Attending: Oncology

## 2017-04-01 DIAGNOSIS — C61 Malignant neoplasm of prostate: Secondary | ICD-10-CM | POA: Insufficient documentation

## 2017-04-01 DIAGNOSIS — E46 Unspecified protein-calorie malnutrition: Secondary | ICD-10-CM | POA: Insufficient documentation

## 2017-04-01 DIAGNOSIS — D649 Anemia, unspecified: Secondary | ICD-10-CM | POA: Insufficient documentation

## 2017-04-01 DIAGNOSIS — Z79899 Other long term (current) drug therapy: Secondary | ICD-10-CM | POA: Diagnosis not present

## 2017-04-01 DIAGNOSIS — Z9221 Personal history of antineoplastic chemotherapy: Secondary | ICD-10-CM | POA: Diagnosis not present

## 2017-04-01 DIAGNOSIS — C7951 Secondary malignant neoplasm of bone: Secondary | ICD-10-CM | POA: Diagnosis not present

## 2017-04-01 DIAGNOSIS — K5900 Constipation, unspecified: Secondary | ICD-10-CM | POA: Insufficient documentation

## 2017-04-01 DIAGNOSIS — Z7982 Long term (current) use of aspirin: Secondary | ICD-10-CM | POA: Diagnosis not present

## 2017-04-01 DIAGNOSIS — M549 Dorsalgia, unspecified: Secondary | ICD-10-CM | POA: Diagnosis not present

## 2017-04-01 DIAGNOSIS — F17200 Nicotine dependence, unspecified, uncomplicated: Secondary | ICD-10-CM | POA: Diagnosis not present

## 2017-04-01 LAB — COMPREHENSIVE METABOLIC PANEL
ALK PHOS: 60 U/L (ref 38–126)
ALT: 16 U/L — AB (ref 17–63)
ANION GAP: 7 (ref 5–15)
AST: 20 U/L (ref 15–41)
Albumin: 3.2 g/dL — ABNORMAL LOW (ref 3.5–5.0)
BILIRUBIN TOTAL: 0.3 mg/dL (ref 0.3–1.2)
BUN: 16 mg/dL (ref 6–20)
CALCIUM: 8.8 mg/dL — AB (ref 8.9–10.3)
CO2: 22 mmol/L (ref 22–32)
CREATININE: 0.91 mg/dL (ref 0.61–1.24)
Chloride: 108 mmol/L (ref 101–111)
Glucose, Bld: 101 mg/dL — ABNORMAL HIGH (ref 65–99)
Potassium: 4.7 mmol/L (ref 3.5–5.1)
SODIUM: 137 mmol/L (ref 135–145)
TOTAL PROTEIN: 6.4 g/dL — AB (ref 6.5–8.1)

## 2017-04-01 LAB — CBC WITH DIFFERENTIAL/PLATELET
BASOS ABS: 0.1 10*3/uL (ref 0–0.1)
BASOS PCT: 1 %
EOS ABS: 0.2 10*3/uL (ref 0–0.7)
Eosinophils Relative: 2 %
HEMATOCRIT: 30.3 % — AB (ref 40.0–52.0)
HEMOGLOBIN: 10 g/dL — AB (ref 13.0–18.0)
Lymphocytes Relative: 17 %
Lymphs Abs: 1.3 10*3/uL (ref 1.0–3.6)
MCH: 31 pg (ref 26.0–34.0)
MCHC: 33 g/dL (ref 32.0–36.0)
MCV: 93.9 fL (ref 80.0–100.0)
MONOS PCT: 8 %
Monocytes Absolute: 0.6 10*3/uL (ref 0.2–1.0)
NEUTROS ABS: 5.7 10*3/uL (ref 1.4–6.5)
NEUTROS PCT: 72 %
Platelets: 251 10*3/uL (ref 150–440)
RBC: 3.23 MIL/uL — ABNORMAL LOW (ref 4.40–5.90)
RDW: 18.2 % — ABNORMAL HIGH (ref 11.5–14.5)
WBC: 7.9 10*3/uL (ref 3.8–10.6)

## 2017-04-01 LAB — PSA: Prostatic Specific Antigen: 15.32 ng/mL — ABNORMAL HIGH (ref 0.00–4.00)

## 2017-04-01 MED ORDER — IOPAMIDOL (ISOVUE-300) INJECTION 61%
100.0000 mL | Freq: Once | INTRAVENOUS | Status: AC | PRN
  Administered 2017-04-01: 100 mL via INTRAVENOUS

## 2017-04-01 MED ORDER — TECHNETIUM TC 99M MEDRONATE IV KIT
25.0000 | PACK | Freq: Once | INTRAVENOUS | Status: AC | PRN
  Administered 2017-04-01: 23.845 via INTRAVENOUS

## 2017-04-03 ENCOUNTER — Encounter: Payer: Self-pay | Admitting: Oncology

## 2017-04-03 ENCOUNTER — Inpatient Hospital Stay (HOSPITAL_BASED_OUTPATIENT_CLINIC_OR_DEPARTMENT_OTHER): Payer: Medicare Other | Admitting: Oncology

## 2017-04-03 VITALS — BP 115/68 | HR 100 | Temp 97.5°F | Resp 20 | Wt 190.4 lb

## 2017-04-03 DIAGNOSIS — F17210 Nicotine dependence, cigarettes, uncomplicated: Secondary | ICD-10-CM

## 2017-04-03 DIAGNOSIS — Z9221 Personal history of antineoplastic chemotherapy: Secondary | ICD-10-CM

## 2017-04-03 DIAGNOSIS — E46 Unspecified protein-calorie malnutrition: Secondary | ICD-10-CM | POA: Diagnosis not present

## 2017-04-03 DIAGNOSIS — C61 Malignant neoplasm of prostate: Secondary | ICD-10-CM

## 2017-04-03 DIAGNOSIS — Z7982 Long term (current) use of aspirin: Secondary | ICD-10-CM

## 2017-04-03 DIAGNOSIS — K5900 Constipation, unspecified: Secondary | ICD-10-CM | POA: Diagnosis not present

## 2017-04-03 DIAGNOSIS — M549 Dorsalgia, unspecified: Secondary | ICD-10-CM

## 2017-04-03 DIAGNOSIS — C7951 Secondary malignant neoplasm of bone: Secondary | ICD-10-CM | POA: Diagnosis not present

## 2017-04-03 DIAGNOSIS — D649 Anemia, unspecified: Secondary | ICD-10-CM

## 2017-04-03 DIAGNOSIS — Z79899 Other long term (current) drug therapy: Secondary | ICD-10-CM

## 2017-04-03 NOTE — Progress Notes (Signed)
Patient denies any concerns today, here for results.   

## 2017-04-07 ENCOUNTER — Telehealth: Payer: Self-pay | Admitting: *Deleted

## 2017-04-07 ENCOUNTER — Telehealth: Payer: Self-pay | Admitting: Pharmacist

## 2017-04-07 DIAGNOSIS — C61 Malignant neoplasm of prostate: Secondary | ICD-10-CM

## 2017-04-07 MED ORDER — ABIRATERONE ACETATE 250 MG PO TABS: 1000.0000 mg | ORAL_TABLET | Freq: Every day | ORAL | 3 refills | Status: DC

## 2017-04-07 MED ORDER — PREDNISONE 5 MG PO TABS: 5.0000 mg | ORAL_TABLET | Freq: Two times a day (BID) | ORAL | 5 refills | Status: DC

## 2017-04-07 NOTE — Telephone Encounter (Signed)
Oral Oncology Pharmacist Encounter  Received new prescription for Zytiga (abiraterone) for the treatment of metastatic prostate cancer in conjunction with prednisone, planned duration until disease progression or unacceptable drug toxicity.  BP from 04/03/17 assessed, no relevant lab abnormalities. Prescription dose and frequency assessed.   Current medication list in Epic reviewed, one DDIs with Zytiga identified: - Zytiga may increase the concentration of tamsulosin. No dose adjustment needed at this time. Monitor patient for adverse event related to more tamsulosin exposure.   Prescription has been e-scribed to the [facility] for benefits analysis and approval.  Oral Oncology Clinic will continue to follow for insurance authorization, copayment issues, initial counseling and start date.  Darl Pikes, PharmD, BCPS Hematology/Oncology Clinical Pharmacist [facility] [phone]  04/07/2017 3:37 PM

## 2017-04-07 NOTE — Telephone Encounter (Signed)
Can you have [name] print it out and give to Terry Wood?  I forgot before I left.  Dosing in his note is correct.  Thanks!

## 2017-04-07 NOTE — Telephone Encounter (Signed)
Prescriptions for Zytiga and prednisone sent to [facility].  Darl Pikes, PharmD, BCPS Hematology/Oncology Clinical Pharmacist [facility] [place] [facility] [phone]  04/07/2017 3:56 PM

## 2017-04-07 NOTE — Telephone Encounter (Signed)
Patient called and states that he was to get medication and that he needs to start on it asap. Per 3/28  office note he is to be switched to Zytiga 1000 mg daily with Prednisone 5 mg twice a day. Please advise.  ASSESSMENT:  Prostate cancer with retroperitoneal adenopathy.  PLAN:     1. Prostate cancer with retroperitoneal adenopathy: CT scan and nuclear med bone scan from April 01, 2017 essentially revealed stable disease.  Patient's PSA has trended down and is now 15.32.  After lengthy discussion with the patient to discuss his treatment options which included continuing Taxotere, switching to cabazitaxel, or Zytiga (patient previously on Xtandi) patient has decided he does not wish to pursue additional chemotherapy at this time and will try Zytiga 1000 mg/day along with 5 mg prednisone twice daily.  Continue monthly Zometa.  Return to clinic in 4 weeks to assess his toleration of Zytiga and continuation of Zometa.

## 2017-04-07 NOTE — Telephone Encounter (Signed)
Done

## 2017-04-08 ENCOUNTER — Telehealth: Payer: Self-pay | Admitting: Oncology

## 2017-04-08 NOTE — Telephone Encounter (Signed)
Oral Oncology Patient Advocate Encounter  Called patient to let him know we are working on getting his medication Zytiga approved thru insurance and it sometimes takes a little time. Will call him back when I get everything done.    Terry Wood Patient Advocate [phone] 04/08/2017 8:16 AM

## 2017-04-08 NOTE — Telephone Encounter (Signed)
Oral Oncology Patient Advocate Encounter  Received notification from [employer] that prior authorization for Fabio Asa is required.  PA submitted on CoverMyMeds Key [id number] Status is pending  Oral Oncology Clinic will continue to follow.    [facility] Patient Advocate [phone] 04/08/2017 9:17 AM

## 2017-04-08 NOTE — Telephone Encounter (Signed)
Oral Oncology Patient Advocate Encounter  Prior Authorization for Terry Wood has been approved.    PA# [id number] Effective dates: 03/09/2017 through 01/06/2098  Oral Oncology Clinic will continue to follow.   Co-pay $11.00   [place] Patient Advocate [phone] 04/08/2017 9:18 AM

## 2017-04-09 MED FILL — ABIRATERONE ACETATE 250 MG: 250 | 30 days supply | Qty: 120 | Fill #0

## 2017-04-09 MED FILL — predniSONE 5 MG TABS: 5 | 30 days supply | Qty: 60 | Fill #0

## 2017-04-30 NOTE — Progress Notes (Signed)
Terry Wood  [phone]  Fax:(336) [place] DOB: [birth year]  MR#: [id number]  QMG#:[id number]  Patient Care Team: Joyice Faster, FNP as PCP - General (Family Medicine)   CHIEF COMPLAINT: Prostate cancer with retroperitoneal adenopathy.  INTERVAL HISTORY: Patient returns to clinic today for further evaluation, assess her toleration of Zytiga, and continuation of Zometa.  He feels significantly improved since discontinuing chemotherapy and nearly back to his baseline.  He currently feels well and is asymptomatic.  He does admit to pain secondary to a recent gout flare. He denies any bony pain. He has no neurologic complaints. He denies any recent fevers or illnesses. He denies any chest pain or shortness of breath.  He denies any nausea, vomiting, constipation, or diarrhea.  He has no urinary complaints.  Patient offers no further specific complaints today.  REVIEW OF SYSTEMS:   Review of Systems  Constitutional: Negative for diaphoresis, fever, malaise/fatigue and weight loss.  Respiratory: Negative.  Negative for cough and shortness of breath.   Cardiovascular: Negative.  Negative for chest pain and leg swelling.  Gastrointestinal: Negative.  Negative for abdominal pain, constipation, nausea and vomiting.  Genitourinary: Negative.  Negative for flank pain and frequency.  Musculoskeletal: Positive for back pain and joint pain. Negative for falls.  Skin: Negative.  Negative for rash.  Neurological: Negative.  Negative for dizziness, sensory change, focal weakness and weakness.  Psychiatric/Behavioral: Negative.  The patient is not nervous/anxious.     As per HPI. Otherwise, a complete review of systems is negative.  ONCOLOGY HISTORY: Oncology History   1. prostate cancer, extra capsule extention, also illiac and retroperitoneal adenopathy on ct, small lesion liver possible metastatic, minimal uptake one area t spine. Asymptomatic.  Not a surgical candidate with CT findings. Considdered not bulk and not visceral disease. Psa 13.6 prior to tx...casodex , then lupron first dose 12/31     Primary prostate cancer with metastasis from prostate to other site [facility])    PAST MEDICAL HISTORY: Past Medical History:  Diagnosis Date  . Cancer of prostate ([place]) 07/13/2014  . Hypertension     PAST SURGICAL HISTORY: No past surgical history on file.  FAMILY HISTORY: Reviewed and unchanged. No reported history of malignancy or chronic disease.    ADVANCED DIRECTIVES:    HEALTH MAINTENANCE: Social History   Tobacco Use  . Smoking status: Current Every Day Smoker  . Smokeless tobacco: Never Used  Substance Use Topics  . Alcohol use: Yes    Alcohol/week: 0.0 oz  . Drug use: No     No Known Allergies  Current Outpatient Medications  Medication Sig Dispense Refill  . abiraterone acetate (ZYTIGA) 250 MG tablet Take 4 tablets (1,000 mg total) by mouth daily. Take on an empty stomach 1 hour before or 2 hours after a meal 120 tablet 3  . fluvastatin (LESCOL) 20 MG capsule Take 20 mg by mouth at bedtime.    . Omega-3 Fatty Acids (FISH OIL) 1000 MG CAPS Take by mouth 4 (four) times daily.    . predniSONE (DELTASONE) 20 MG tablet 20 mg 2 (two) times daily with a meal.     . predniSONE (DELTASONE) 5 MG tablet Take 1 tablet (5 mg total) by mouth 2 (two) times daily with a meal. 60 tablet 5  . tamsulosin (FLOMAX) 0.4 MG CAPS capsule Take 0.4 mg by mouth 2 (two) times daily.    Marland Kitchen aspirin 325 MG EC tablet Take 325  mg by mouth daily.    . megestrol (MEGACE) 40 MG tablet Take 1 tablet (40 mg total) daily by mouth. (Patient not taking: Reported on 03/06/2017) 30 tablet 1  . ondansetron (ZOFRAN) 8 MG tablet Take 1 tablet (8 mg total) 2 (two) times daily as needed by mouth for refractory nausea / vomiting. (Patient not taking: Reported on 04/03/2017) 30 tablet 2  . prochlorperazine (COMPAZINE) 10 MG tablet Take 1 tablet (10 mg total) every  6 (six) hours as needed by mouth (Nausea or vomiting). (Patient not taking: Reported on 03/06/2017) 60 tablet 2   No current facility-administered medications for this visit.    Facility-Administered Medications Ordered in Other Visits  Medication Dose Route Frequency Provider Last Rate Last Dose  . ondansetron (ZOFRAN) injection 8 mg  8 mg Intravenous Once Lloyd Huger, MD        OBJECTIVE: BP 136/68 (BP Location: Left Arm, Patient Position: Sitting)   Pulse 77   Temp (!) 95.4 F (35.2 C) (Oral)   Resp 20   Wt 190 lb 1.6 oz (86.2 kg)   BMI 28.90 kg/m    Body mass index is 28.9 kg/m.    ECOG FS:0 - Asymptomatic  General: Well-developed, well-nourished, no acute distress. Eyes: Pink conjunctiva, anicteric sclera. Lungs: Clear to auscultation bilaterally. Heart: Regular rate and rhythm. No rubs, murmurs, or gallops. Abdomen: Soft, nontender, nondistended. No organomegaly noted, normoactive bowel sounds. Musculoskeletal: No edema, cyanosis, or clubbing. Neuro: Alert, answering all questions appropriately. Cranial nerves grossly intact. Skin: No rashes or petechiae noted. Psych: Normal affect.  LAB RESULTS:  Appointment on 05/01/2017  Component Date Value Ref Range Status  . Sodium 05/01/2017 135  135 - 145 mmol/L Final  . Potassium 05/01/2017 5.1  3.5 - 5.1 mmol/L Final  . Chloride 05/01/2017 104  101 - 111 mmol/L Final  . CO2 05/01/2017 22  22 - 32 mmol/L Final  . Glucose, Bld 05/01/2017 141* 65 - 99 mg/dL Final  . BUN 05/01/2017 31* 6 - 20 mg/dL Final  . Creatinine, Ser 05/01/2017 0.94  0.61 - 1.24 mg/dL Final  . Calcium 05/01/2017 8.7* 8.9 - 10.3 mg/dL Final  . Total Protein 05/01/2017 6.9  6.5 - 8.1 g/dL Final  . Albumin 05/01/2017 3.6  3.5 - 5.0 g/dL Final  . AST 05/01/2017 17  15 - 41 U/L Final  . ALT 05/01/2017 12* 17 - 63 U/L Final  . Alkaline Phosphatase 05/01/2017 64  38 - 126 U/L Final  . Total Bilirubin 05/01/2017 0.3  0.3 - 1.2 mg/dL Final  . GFR calc  non Af Amer 05/01/2017 >60  >60 mL/min Final  . GFR calc Af Amer 05/01/2017 >60  >60 mL/min Final   Comment: (NOTE) The eGFR has been calculated using the CKD EPI equation. This calculation has not been validated in all clinical situations. eGFR's persistently <60 mL/min signify possible Chronic Kidney Disease.   Georgiann Hahn gap 05/01/2017 9  5 - 15 Final   Performed at [facility], [address] [place], [place] [address]  . WBC 05/01/2017 14.4* 3.8 - 10.6 K/uL Final  . RBC 05/01/2017 3.67* 4.40 - 5.90 MIL/uL Final  . Hemoglobin 05/01/2017 11.1* 13.0 - 18.0 g/dL Final  . HCT 05/01/2017 33.4* 40.0 - 52.0 % Final  . MCV 05/01/2017 90.9  80.0 - 100.0 fL Final  . MCH 05/01/2017 30.3  26.0 - 34.0 pg Final  . MCHC 05/01/2017 33.3  32.0 - 36.0 g/dL Final  . RDW  05/01/2017 15.8* 11.5 - 14.5 % Final  . Platelets 05/01/2017 310  150 - 440 K/uL Final  . Neutrophils Relative % 05/01/2017 90  % Final  . Neutro Abs 05/01/2017 12.9* 1.4 - 6.5 K/uL Final  . Lymphocytes Relative 05/01/2017 6  % Final  . Lymphs Abs 05/01/2017 0.9* 1.0 - 3.6 K/uL Final  . Monocytes Relative 05/01/2017 4  % Final  . Monocytes Absolute 05/01/2017 0.6  0.2 - 1.0 K/uL Final  . Eosinophils Relative 05/01/2017 0  % Final  . Eosinophils Absolute 05/01/2017 0.0  0 - 0.7 K/uL Final  . Basophils Relative 05/01/2017 0  % Final  . Basophils Absolute 05/01/2017 0.0  0 - 0.1 K/uL Final   Performed at [facility], [address] [place], [place] [address]  . Prostatic Specific Antigen 05/01/2017 23.21* 0.00 - 4.00 ng/mL Final   Comment: (NOTE) While PSA levels of <=4.0 ng/ml are reported as reference range, some men with levels below 4.0 ng/ml can have prostate cancer and many men with PSA above 4.0 ng/ml do not have prostate cancer.  Other tests such as free PSA, age specific reference ranges, PSA velocity and PSA doubling time may be helpful especially in men less than 17 years old. Performed at [facility], [place] [address] [place], [place] [address]      STUDIES: No results found.  ASSESSMENT:  Prostate cancer with retroperitoneal adenopathy.  PLAN:     1. Prostate cancer with retroperitoneal adenopathy: CT scan and nuclear med bone scan from April 01, 2017 essentially revealed stable disease.  Despite initiating Zytiga and prednisone last month, patient's PSA is now trending up at 23.21.  We will continue current treatment for now, the patient likely will require more aggressive treatment likely using cabazitaxel in the near future.  Proceed with Zometa today.  Return to clinic in 4 weeks with repeat laboratory work and further evaluation.  2. Chronic renal insufficiency: Resolved. 3.  Constipation: Patient does not complain of this today.  Continue OTC treatments.   4.  Poor appetite: Patient's appetite has improved, but he still has Megace as needed. 5.  Anemia: Hemoglobin slowly improving, monitor. 6.  Gout: Patient currently on a prednisone taper.  Consider colchicine and allopurinol in the future.  Treatment per primary care.  Approximately 30 minutes was spent in discussion of which greater than 50% was consultation.  Patient expressed understanding and was in agreement with this plan. He also understands that He can call clinic at any time with any questions, concerns, or complaints.    Lloyd Huger, MD   04/26/2015 11:17 AM

## 2017-05-01 ENCOUNTER — Other Ambulatory Visit: Payer: Self-pay

## 2017-05-01 ENCOUNTER — Inpatient Hospital Stay: Payer: Medicare Other

## 2017-05-01 ENCOUNTER — Inpatient Hospital Stay: Payer: Medicare Other | Attending: Oncology

## 2017-05-01 ENCOUNTER — Inpatient Hospital Stay (HOSPITAL_BASED_OUTPATIENT_CLINIC_OR_DEPARTMENT_OTHER): Payer: Medicare Other | Admitting: Oncology

## 2017-05-01 VITALS — BP 136/68 | HR 77 | Temp 95.4°F | Resp 20 | Wt 190.1 lb

## 2017-05-01 DIAGNOSIS — C61 Malignant neoplasm of prostate: Secondary | ICD-10-CM | POA: Diagnosis present

## 2017-05-01 DIAGNOSIS — F17200 Nicotine dependence, unspecified, uncomplicated: Secondary | ICD-10-CM | POA: Insufficient documentation

## 2017-05-01 DIAGNOSIS — M109 Gout, unspecified: Secondary | ICD-10-CM

## 2017-05-01 DIAGNOSIS — D649 Anemia, unspecified: Secondary | ICD-10-CM | POA: Insufficient documentation

## 2017-05-01 DIAGNOSIS — K5900 Constipation, unspecified: Secondary | ICD-10-CM

## 2017-05-01 DIAGNOSIS — F17210 Nicotine dependence, cigarettes, uncomplicated: Secondary | ICD-10-CM | POA: Diagnosis not present

## 2017-05-01 DIAGNOSIS — C7951 Secondary malignant neoplasm of bone: Secondary | ICD-10-CM | POA: Insufficient documentation

## 2017-05-01 DIAGNOSIS — Z79899 Other long term (current) drug therapy: Secondary | ICD-10-CM | POA: Insufficient documentation

## 2017-05-01 DIAGNOSIS — Z7982 Long term (current) use of aspirin: Secondary | ICD-10-CM | POA: Insufficient documentation

## 2017-05-01 DIAGNOSIS — I10 Essential (primary) hypertension: Secondary | ICD-10-CM

## 2017-05-01 LAB — CBC WITH DIFFERENTIAL/PLATELET
BASOS PCT: 0 %
Basophils Absolute: 0 10*3/uL (ref 0–0.1)
Eosinophils Absolute: 0 10*3/uL (ref 0–0.7)
Eosinophils Relative: 0 %
HEMATOCRIT: 33.4 % — AB (ref 40.0–52.0)
HEMOGLOBIN: 11.1 g/dL — AB (ref 13.0–18.0)
Lymphocytes Relative: 6 %
Lymphs Abs: 0.9 10*3/uL — ABNORMAL LOW (ref 1.0–3.6)
MCH: 30.3 pg (ref 26.0–34.0)
MCHC: 33.3 g/dL (ref 32.0–36.0)
MCV: 90.9 fL (ref 80.0–100.0)
MONO ABS: 0.6 10*3/uL (ref 0.2–1.0)
MONOS PCT: 4 %
NEUTROS ABS: 12.9 10*3/uL — AB (ref 1.4–6.5)
NEUTROS PCT: 90 %
Platelets: 310 10*3/uL (ref 150–440)
RBC: 3.67 MIL/uL — ABNORMAL LOW (ref 4.40–5.90)
RDW: 15.8 % — AB (ref 11.5–14.5)
WBC: 14.4 10*3/uL — ABNORMAL HIGH (ref 3.8–10.6)

## 2017-05-01 LAB — COMPREHENSIVE METABOLIC PANEL
ALBUMIN: 3.6 g/dL (ref 3.5–5.0)
ALK PHOS: 64 U/L (ref 38–126)
ALT: 12 U/L — ABNORMAL LOW (ref 17–63)
ANION GAP: 9 (ref 5–15)
AST: 17 U/L (ref 15–41)
BUN: 31 mg/dL — ABNORMAL HIGH (ref 6–20)
CHLORIDE: 104 mmol/L (ref 101–111)
CO2: 22 mmol/L (ref 22–32)
Calcium: 8.7 mg/dL — ABNORMAL LOW (ref 8.9–10.3)
Creatinine, Ser: 0.94 mg/dL (ref 0.61–1.24)
GFR calc non Af Amer: >60 mL/min (ref >60)
GLUCOSE: 141 mg/dL — AB (ref 65–99)
POTASSIUM: 5.1 mmol/L (ref 3.5–5.1)
SODIUM: 135 mmol/L (ref 135–145)
Total Bilirubin: 0.3 mg/dL (ref 0.3–1.2)
Total Protein: 6.9 g/dL (ref 6.5–8.1)

## 2017-05-01 LAB — PSA: Prostatic Specific Antigen: 23.21 ng/mL — ABNORMAL HIGH (ref 0.00–4.00)

## 2017-05-01 MED ORDER — SODIUM CHLORIDE 0.9 % IV SOLN
Freq: Once | INTRAVENOUS | Status: AC
  Administered 2017-05-01: 12:00:00 via INTRAVENOUS
  Filled 2017-05-01: qty 1000

## 2017-05-01 MED ORDER — ZOLEDRONIC ACID 4 MG/100ML IV SOLN
4.0000 mg | Freq: Once | INTRAVENOUS | Status: AC
  Administered 2017-05-01: 4 mg via INTRAVENOUS
  Filled 2017-05-01: qty 100

## 2017-05-01 NOTE — Progress Notes (Signed)
Pt here for follow up. Stated doing "32 out of 100 " stated new dx of gout 4/15 and started Prednisone 20 mg bid

## 2017-05-02 MED FILL — predniSONE 5 MG TABS: 5 | 30 days supply | Qty: 60 | Fill #1

## 2017-05-02 MED FILL — ABIRATERONE ACETATE 250 MG: 250 | 30 days supply | Qty: 120 | Fill #1

## 2017-05-09 ENCOUNTER — Telehealth: Payer: Self-pay | Admitting: *Deleted

## 2017-05-09 NOTE — Telephone Encounter (Signed)
Pt notified of PSA results. Pt to continue current treatment, f/u will remain the same at this time Pt will come in 2 days prior to next visit to have labs drawn so Dr. Grayland Ormond will have his PSA when he comes for f/u visit. Pt verbalizes understanding of plan.

## 2017-05-25 NOTE — Progress Notes (Signed)
Terry Wood  [phone]  Fax:(336) [place] DOB: 11-10-1933  MR#: [id number]  KGY#:[id number]  Patient Care Team: Joyice Faster, FNP as PCP - General (Family Medicine)   CHIEF COMPLAINT: Prostate cancer with retroperitoneal adenopathy.  INTERVAL HISTORY: Patient returns to clinic today for further evaluation and treatment planning.  Despite treatment with Zytiga, patient's PSA continues to trend up.  He currently feels well and is asymptomatic. He denies any bony pain. He has no neurologic complaints. He denies any recent fevers or illnesses. He denies any chest pain or shortness of breath.  He denies any nausea, vomiting, constipation, or diarrhea.  He has no urinary complaints.  Patient offers no specific complaints today.  REVIEW OF SYSTEMS:   Review of Systems  Constitutional: Negative for diaphoresis, fever, malaise/fatigue and weight loss.  Respiratory: Negative.  Negative for cough and shortness of breath.   Cardiovascular: Negative.  Negative for chest pain and leg swelling.  Gastrointestinal: Negative.  Negative for abdominal pain, constipation, nausea and vomiting.  Genitourinary: Negative.  Negative for flank pain and frequency.  Musculoskeletal: Positive for back pain and joint pain. Negative for falls.  Skin: Negative.  Negative for rash.  Neurological: Negative.  Negative for dizziness, sensory change, focal weakness and weakness.  Psychiatric/Behavioral: Negative.  The patient is not nervous/anxious.     As per HPI. Otherwise, a complete review of systems is negative.  ONCOLOGY HISTORY: Oncology History   1. prostate cancer, extra capsule extention, also illiac and retroperitoneal adenopathy on ct, small lesion liver possible metastatic, minimal uptake one area t spine. Asymptomatic. Not a surgical candidate with CT findings. Considdered not bulk and not visceral disease. Psa 13.6 prior to tx...casodex , then  lupron first dose 12/31     Primary prostate cancer with metastasis from prostate to other site [facility])    PAST MEDICAL HISTORY: Past Medical History:  Diagnosis Date  . Cancer of prostate ([place]) 07/13/2014  . Hypertension     PAST SURGICAL HISTORY: History reviewed. No pertinent surgical history.  FAMILY HISTORY: Reviewed and unchanged. No reported history of malignancy or chronic disease.    ADVANCED DIRECTIVES:    HEALTH MAINTENANCE: Social History   Tobacco Use  . Smoking status: Current Every Day Smoker  . Smokeless tobacco: Never Used  Substance Use Topics  . Alcohol use: Yes    Alcohol/week: 0.0 oz  . Drug use: No     No Known Allergies  Current Outpatient Medications  Medication Sig Dispense Refill  . abiraterone acetate (ZYTIGA) 250 MG tablet Take 4 tablets (1,000 mg total) by mouth daily. Take on an empty stomach 1 hour before or 2 hours after a meal 120 tablet 3  . aspirin 325 MG EC tablet Take 325 mg by mouth daily.    . fluvastatin (LESCOL) 20 MG capsule Take 20 mg by mouth at bedtime.    . Omega-3 Fatty Acids (FISH OIL) 1000 MG CAPS Take by mouth 4 (four) times daily.    . ondansetron (ZOFRAN) 8 MG tablet Take 1 tablet (8 mg total) 2 (two) times daily as needed by mouth for refractory nausea / vomiting. 30 tablet 2  . predniSONE (DELTASONE) 20 MG tablet 20 mg 2 (two) times daily with a meal.     . predniSONE (DELTASONE) 5 MG tablet Take 1 tablet (5 mg total) by mouth 2 (two) times daily with a meal. 60 tablet 5  . prochlorperazine (COMPAZINE) 10 MG  tablet Take 1 tablet (10 mg total) every 6 (six) hours as needed by mouth (Nausea or vomiting). 60 tablet 2  . tamsulosin (FLOMAX) 0.4 MG CAPS capsule Take 0.4 mg by mouth 2 (two) times daily.     No current facility-administered medications for this visit.    Facility-Administered Medications Ordered in Other Visits  Medication Dose Route Frequency Provider Last Rate Last Dose  . ondansetron (ZOFRAN) injection  8 mg  8 mg Intravenous Once Lloyd Huger, MD        OBJECTIVE: BP (!) 151/84 (BP Location: Left Arm, Patient Position: Sitting)   Pulse 78   Temp (!) 97.1 F (36.2 C) (Tympanic)   Resp 16   Ht _0  (1.727 m)   Wt 197 lb 9.6 oz (89.6 kg)   BMI 30.04 kg/m    Body mass index is 30.04 kg/m.    ECOG FS:0 - Asymptomatic  General: Well-developed, well-nourished, no acute distress. Eyes: Pink conjunctiva, anicteric sclera. Lungs: Clear to auscultation bilaterally. Heart: Regular rate and rhythm. No rubs, murmurs, or gallops. Abdomen: Soft, nontender, nondistended. No organomegaly noted, normoactive bowel sounds. Musculoskeletal: No edema, cyanosis, or clubbing. Neuro: Alert, answering all questions appropriately. Cranial nerves grossly intact. Skin: No rashes or petechiae noted. Psych: Normal affect.  LAB RESULTS:  Orders Only on 05/27/2017  Component Date Value Ref Range Status  . Sodium 05/27/2017 135  135 - 145 mmol/L Final  . Potassium 05/27/2017 4.3  3.5 - 5.1 mmol/L Final  . Chloride 05/27/2017 103  101 - 111 mmol/L Final  . CO2 05/27/2017 22  22 - 32 mmol/L Final  . Glucose, Bld 05/27/2017 148* 65 - 99 mg/dL Final  . BUN 05/27/2017 33* 6 - 20 mg/dL Final  . Creatinine, Ser 05/27/2017 0.96  0.61 - 1.24 mg/dL Final  . Calcium 05/27/2017 8.9  8.9 - 10.3 mg/dL Final  . Total Protein 05/27/2017 6.8  6.5 - 8.1 g/dL Final  . Albumin 05/27/2017 3.5  3.5 - 5.0 g/dL Final  . AST 05/27/2017 18  15 - 41 U/L Final  . ALT 05/27/2017 12* 17 - 63 U/L Final  . Alkaline Phosphatase 05/27/2017 61  38 - 126 U/L Final  . Total Bilirubin 05/27/2017 0.5  0.3 - 1.2 mg/dL Final  . GFR calc non Af Amer 05/27/2017 >60  >60 mL/min Final  . GFR calc Af Amer 05/27/2017 >60  >60 mL/min Final   Comment: (NOTE) The eGFR has been calculated using the CKD EPI equation. This calculation has not been validated in all clinical situations. eGFR's persistently <60 mL/min signify possible Chronic  Kidney Disease.   Georgiann Hahn gap 05/27/2017 10  5 - 15 Final   Performed at [facility], [address] [place], [place] [address]  . WBC 05/27/2017 9.8  3.8 - 10.6 K/uL Final  . RBC 05/27/2017 3.83* 4.40 - 5.90 MIL/uL Final  . Hemoglobin 05/27/2017 11.8* 13.0 - 18.0 g/dL Final  . HCT 05/27/2017 34.8* 40.0 - 52.0 % Final  . MCV 05/27/2017 90.8  80.0 - 100.0 fL Final  . MCH 05/27/2017 30.8  26.0 - 34.0 pg Final  . MCHC 05/27/2017 33.9  32.0 - 36.0 g/dL Final  . RDW 05/27/2017 15.5* 11.5 - 14.5 % Final  . Platelets 05/27/2017 273  150 - 440 K/uL Final  . Neutrophils Relative % 05/27/2017 81  % Final  . Neutro Abs 05/27/2017 8.0* 1.4 - 6.5 K/uL Final  . Lymphocytes Relative 05/27/2017 12  % Final  .  Lymphs Abs 05/27/2017 1.2  1.0 - 3.6 K/uL Final  . Monocytes Relative 05/27/2017 6  % Final  . Monocytes Absolute 05/27/2017 0.6  0.2 - 1.0 K/uL Final  . Eosinophils Relative 05/27/2017 1  % Final  . Eosinophils Absolute 05/27/2017 0.1  0 - 0.7 K/uL Final  . Basophils Relative 05/27/2017 0  % Final  . Basophils Absolute 05/27/2017 0.0  0 - 0.1 K/uL Final   Performed at [facility], [address] [place], [place] [address]  . Prostatic Specific Antigen 05/27/2017 26.44* 0.00 - 4.00 ng/mL Final   Comment: (NOTE) While PSA levels of <=4.0 ng/ml are reported as reference range, some men with levels below 4.0 ng/ml can have prostate cancer and many men with PSA above 4.0 ng/ml do not have prostate cancer.  Other tests such as free PSA, age specific reference ranges, PSA velocity and PSA doubling time may be helpful especially in men less than 56 years old. Performed at [facility], [place] [address] [place], [place] [address]      STUDIES: No results found.  ASSESSMENT:  Prostate cancer with retroperitoneal adenopathy.  PLAN:     1. Prostate cancer with retroperitoneal adenopathy: CT scan and nuclear med bone scan from April 01, 2017 essentially revealed stable  disease.  Despite treatment with Zytiga and prednisone, patient's PSA continues to trend up and is now 26.44.  Will discontinue Zytiga at this time and proceed with cabazitaxel every 3 weeks.  Will hold Udenyca for cycle 1, but will consider adding it for subsequent cycles if patient becomes neutropenic.  Patient will also receive Zometa on the odd-numbered cycles.  Return to clinic on June 12, 2017 for further evaluation and consideration of cycle 1.    2. Chronic renal insufficiency: Resolved. 3.  Constipation: Patient does not complain of this today.  Continue OTC treatments.   4.  Poor appetite: Improved.  Continue Megace as needed. 5.  Anemia: Patient's hemoglobin is now nearly within normal limits.  Monitor. 6.  Gout: Patient does not complain of this today.  Approximately 30 minutes spent in discussion of which greater than 50% was consultation.  Patient expressed understanding and was in agreement with this plan. He also understands that He can call clinic at any time with any questions, concerns, or complaints.    Lloyd Huger, MD   04/26/2015 8:37 AM

## 2017-05-27 ENCOUNTER — Other Ambulatory Visit: Payer: Self-pay

## 2017-05-27 ENCOUNTER — Inpatient Hospital Stay: Payer: Medicare Other | Attending: Oncology

## 2017-05-27 DIAGNOSIS — C61 Malignant neoplasm of prostate: Secondary | ICD-10-CM | POA: Diagnosis not present

## 2017-05-27 DIAGNOSIS — Z79899 Other long term (current) drug therapy: Secondary | ICD-10-CM | POA: Insufficient documentation

## 2017-05-27 DIAGNOSIS — F17200 Nicotine dependence, unspecified, uncomplicated: Secondary | ICD-10-CM | POA: Insufficient documentation

## 2017-05-27 DIAGNOSIS — D649 Anemia, unspecified: Secondary | ICD-10-CM | POA: Diagnosis not present

## 2017-05-27 DIAGNOSIS — Z7982 Long term (current) use of aspirin: Secondary | ICD-10-CM | POA: Insufficient documentation

## 2017-05-27 LAB — COMPREHENSIVE METABOLIC PANEL
ALBUMIN: 3.5 g/dL (ref 3.5–5.0)
ALT: 12 U/L — ABNORMAL LOW (ref 17–63)
ANION GAP: 10 (ref 5–15)
AST: 18 U/L (ref 15–41)
Alkaline Phosphatase: 61 U/L (ref 38–126)
BUN: 33 mg/dL — ABNORMAL HIGH (ref 6–20)
CHLORIDE: 103 mmol/L (ref 101–111)
CO2: 22 mmol/L (ref 22–32)
Calcium: 8.9 mg/dL (ref 8.9–10.3)
Creatinine, Ser: 0.96 mg/dL (ref 0.61–1.24)
GFR calc Af Amer: >60 mL/min (ref >60)
GFR calc non Af Amer: >60 mL/min (ref >60)
Glucose, Bld: 148 mg/dL — ABNORMAL HIGH (ref 65–99)
POTASSIUM: 4.3 mmol/L (ref 3.5–5.1)
SODIUM: 135 mmol/L (ref 135–145)
Total Bilirubin: 0.5 mg/dL (ref 0.3–1.2)
Total Protein: 6.8 g/dL (ref 6.5–8.1)

## 2017-05-27 LAB — CBC WITH DIFFERENTIAL/PLATELET
Basophils Absolute: 0 10*3/uL (ref 0–0.1)
Basophils Relative: 0 %
Eosinophils Absolute: 0.1 10*3/uL (ref 0–0.7)
Eosinophils Relative: 1 %
HEMATOCRIT: 34.8 % — AB (ref 40.0–52.0)
Hemoglobin: 11.8 g/dL — ABNORMAL LOW (ref 13.0–18.0)
LYMPHS PCT: 12 %
Lymphs Abs: 1.2 10*3/uL (ref 1.0–3.6)
MCH: 30.8 pg (ref 26.0–34.0)
MCHC: 33.9 g/dL (ref 32.0–36.0)
MCV: 90.8 fL (ref 80.0–100.0)
MONO ABS: 0.6 10*3/uL (ref 0.2–1.0)
MONOS PCT: 6 %
NEUTROS ABS: 8 10*3/uL — AB (ref 1.4–6.5)
Neutrophils Relative %: 81 %
Platelets: 273 10*3/uL (ref 150–440)
RBC: 3.83 MIL/uL — ABNORMAL LOW (ref 4.40–5.90)
RDW: 15.5 % — AB (ref 11.5–14.5)
WBC: 9.8 10*3/uL (ref 3.8–10.6)

## 2017-05-27 LAB — PSA: Prostatic Specific Antigen: 26.44 ng/mL — ABNORMAL HIGH (ref 0.00–4.00)

## 2017-05-28 MED FILL — predniSONE 5 MG TABS: 5 | 30 days supply | Qty: 60 | Fill #2

## 2017-05-28 MED FILL — ABIRATERONE ACETATE 250 MG: 250 | 30 days supply | Qty: 120 | Fill #2

## 2017-05-29 ENCOUNTER — Other Ambulatory Visit: Payer: Medicare Other

## 2017-05-29 ENCOUNTER — Other Ambulatory Visit: Payer: Self-pay

## 2017-05-29 ENCOUNTER — Inpatient Hospital Stay (HOSPITAL_BASED_OUTPATIENT_CLINIC_OR_DEPARTMENT_OTHER): Payer: Medicare Other | Admitting: Oncology

## 2017-05-29 ENCOUNTER — Inpatient Hospital Stay: Payer: Medicare Other

## 2017-05-29 ENCOUNTER — Encounter: Payer: Self-pay | Admitting: Oncology

## 2017-05-29 VITALS — BP 151/84 | HR 78 | Temp 97.1°F | Resp 16 | Ht 68.0 in | Wt 197.6 lb

## 2017-05-29 DIAGNOSIS — D649 Anemia, unspecified: Secondary | ICD-10-CM

## 2017-05-29 DIAGNOSIS — Z7982 Long term (current) use of aspirin: Secondary | ICD-10-CM | POA: Diagnosis not present

## 2017-05-29 DIAGNOSIS — Z79899 Other long term (current) drug therapy: Secondary | ICD-10-CM | POA: Diagnosis not present

## 2017-05-29 DIAGNOSIS — C61 Malignant neoplasm of prostate: Secondary | ICD-10-CM | POA: Diagnosis not present

## 2017-05-29 DIAGNOSIS — F17210 Nicotine dependence, cigarettes, uncomplicated: Secondary | ICD-10-CM

## 2017-05-29 NOTE — Progress Notes (Signed)
No Zometa today.

## 2017-06-04 MED ORDER — PREDNISONE 10 MG PO TABS: 10.0000 mg | ORAL_TABLET | Freq: Every day | ORAL | 3 refills | Status: DC

## 2017-06-06 ENCOUNTER — Telehealth: Payer: Self-pay | Admitting: *Deleted

## 2017-06-06 NOTE — Telephone Encounter (Signed)
Call returned to patient.

## 2017-06-12 ENCOUNTER — Ambulatory Visit: Payer: Medicare Other

## 2017-06-12 ENCOUNTER — Other Ambulatory Visit: Payer: Medicare Other

## 2017-06-12 ENCOUNTER — Ambulatory Visit: Payer: Medicare Other | Admitting: Oncology

## 2017-06-15 NOTE — Progress Notes (Signed)
[place]  [phone]  Fax:([phone]) [place] DOB: 1933/08/23  MR#: [id number]  RAQ#:[id number]  Patient Care Team: Joyice Faster, FNP as PCP - General (Family Medicine)   CHIEF COMPLAINT: Prostate cancer with retroperitoneal adenopathy.  INTERVAL HISTORY: Patient returns to clinic today for further evaluation and initiation of treatment using Cabazitaxel.  He is anxious, but otherwise feels well.  He complains of right hip pain, but denies any other pain.  He has no neurologic complaints.  He denies any recent fevers or illnesses. He denies any chest pain or shortness of breath.  He denies any nausea, vomiting, constipation, or diarrhea.  He has no urinary complaints.  Patient offers no further specific complaints today.  REVIEW OF SYSTEMS:   Review of Systems  Constitutional: Negative for diaphoresis, fever, malaise/fatigue and weight loss.  Respiratory: Negative.  Negative for cough and shortness of breath.   Cardiovascular: Negative.  Negative for chest pain and leg swelling.  Gastrointestinal: Negative.  Negative for abdominal pain, constipation, nausea and vomiting.  Genitourinary: Negative.  Negative for flank pain and frequency.  Musculoskeletal: Positive for back pain and joint pain. Negative for falls.  Skin: Negative.  Negative for rash.  Neurological: Negative.  Negative for dizziness, sensory change, focal weakness and weakness.  Psychiatric/Behavioral: Negative.  The patient is not nervous/anxious.     As per HPI. Otherwise, a complete review of systems is negative.  ONCOLOGY HISTORY: Oncology History   1. prostate cancer, extra capsule extention, also illiac and retroperitoneal adenopathy on ct, small lesion liver possible metastatic, minimal uptake one area t spine. Asymptomatic. Not a surgical candidate with CT findings. Considdered not bulk and not visceral disease. Psa 13.6 prior to tx...casodex , then lupron  first dose 12/31     Primary prostate cancer with metastasis from prostate to other site [facility])   06/04/2017 -  Chemotherapy    The patient had pegfilgrastim-cbqv (UDENYCA) injection 6 mg, 6 mg, Subcutaneous, Once, 0 of 5 cycles cabazitaxel (JEVTANA) 52 mg in dextrose 5 % 250 mL chemo infusion, 25 mg/m2 = 52 mg, Intravenous,  Once, 1 of 6 cycles  for chemotherapy treatment.        PAST MEDICAL HISTORY: Past Medical History:  Diagnosis Date  . Cancer of prostate ([place]) 07/13/2014  . Hypertension     PAST SURGICAL HISTORY: History reviewed. No pertinent surgical history.  FAMILY HISTORY: Reviewed and unchanged. No reported history of malignancy or chronic disease.    ADVANCED DIRECTIVES:    HEALTH MAINTENANCE: Social History   Tobacco Use  . Smoking status: Current Every Day Smoker  . Smokeless tobacco: Never Used  Substance Use Topics  . Alcohol use: Yes    Alcohol/week: 0.0 oz  . Drug use: No     No Known Allergies  Current Outpatient Medications  Medication Sig Dispense Refill  . abiraterone acetate (ZYTIGA) 250 MG tablet Take 4 tablets (1,000 mg total) by mouth daily. Take on an empty stomach 1 hour before or 2 hours after a meal 120 tablet 3  . aspirin 325 MG EC tablet Take 325 mg by mouth daily.    . Omega-3 Fatty Acids (FISH OIL) 1000 MG CAPS Take by mouth 4 (four) times daily.    . tamsulosin (FLOMAX) 0.4 MG CAPS capsule Take 0.4 mg by mouth 2 (two) times daily.    Marland Kitchen allopurinol (ZYLOPRIM) 100 MG tablet     . fluvastatin (LESCOL) 20 MG capsule  Take 20 mg by mouth at bedtime.    . hydrochlorothiazide (HYDRODIURIL) 12.5 MG tablet   1  . HYDROcodone-acetaminophen (NORCO/VICODIN) 5-325 MG tablet Take 1 tablet by mouth every 6 (six) hours as needed for moderate pain. 30 tablet 0  . predniSONE (DELTASONE) 10 MG tablet Take 1 tablet (10 mg total) by mouth daily. (Patient not taking: Reported on 06/19/2017) 21 tablet 3  . predniSONE (DELTASONE) 20 MG tablet 20 mg 2 (two)  times daily with a meal.     . predniSONE (DELTASONE) 5 MG tablet Take 1 tablet (5 mg total) by mouth 2 (two) times daily with a meal. (Patient not taking: Reported on 06/19/2017) 60 tablet 5   No current facility-administered medications for this visit.    Facility-Administered Medications Ordered in Other Visits  Medication Dose Route Frequency Provider Last Rate Last Dose  . ondansetron (ZOFRAN) injection 8 mg  8 mg Intravenous Once Lloyd Huger, MD        OBJECTIVE: BP (!) 150/80 (BP Location: Left Arm, Patient Position: Sitting)   Pulse 81   Temp 98.5 F (36.9 C) (Tympanic)   Resp 16   Wt 202 lb 3.2 oz (91.7 kg)   BMI 30.74 kg/m    Body mass index is 30.74 kg/m.    ECOG FS:0 - Asymptomatic  General: Well-developed, well-nourished, no acute distress. Eyes: Pink conjunctiva, anicteric sclera. Lungs: Clear to auscultation bilaterally. Heart: Regular rate and rhythm. No rubs, murmurs, or gallops. Abdomen: Soft, nontender, nondistended. No organomegaly noted, normoactive bowel sounds. Musculoskeletal: No edema, cyanosis, or clubbing. Neuro: Alert, answering all questions appropriately. Cranial nerves grossly intact. Skin: No rashes or petechiae noted. Psych: Normal affect.  LAB RESULTS:  Orders Only on 06/19/2017  Component Date Value Ref Range Status  . Sodium 06/19/2017 138  135 - 145 mmol/L Final  . Potassium 06/19/2017 4.3  3.5 - 5.1 mmol/L Final  . Chloride 06/19/2017 108  101 - 111 mmol/L Final  . CO2 06/19/2017 22  22 - 32 mmol/L Final  . Glucose, Bld 06/19/2017 111* 65 - 99 mg/dL Final  . BUN 06/19/2017 15  6 - 20 mg/dL Final  . Creatinine, Ser 06/19/2017 1.01  0.61 - 1.24 mg/dL Final  . Calcium 06/19/2017 8.3* 8.9 - 10.3 mg/dL Final  . Total Protein 06/19/2017 6.5  6.5 - 8.1 g/dL Final  . Albumin 06/19/2017 3.1* 3.5 - 5.0 g/dL Final  . AST 06/19/2017 17  15 - 41 U/L Final  . ALT 06/19/2017 9* 17 - 63 U/L Final  . Alkaline Phosphatase 06/19/2017 57  38 -  126 U/L Final  . Total Bilirubin 06/19/2017 0.5  0.3 - 1.2 mg/dL Final  . GFR calc non Af Amer 06/19/2017 >60  >60 mL/min Final  . GFR calc Af Amer 06/19/2017 >60  >60 mL/min Final   Comment: (NOTE) The eGFR has been calculated using the CKD EPI equation. This calculation has not been validated in all clinical situations. eGFR's persistently <60 mL/min signify possible Chronic Kidney Disease.   Georgiann Hahn gap 06/19/2017 8  5 - 15 Final   Performed at [facility], [address] [place], [place] [address]  . WBC 06/19/2017 8.3  3.8 - 10.6 K/uL Final  . RBC 06/19/2017 3.50* 4.40 - 5.90 MIL/uL Final  . Hemoglobin 06/19/2017 10.6* 13.0 - 18.0 g/dL Final  . HCT 06/19/2017 31.3* 40.0 - 52.0 % Final  . MCV 06/19/2017 89.3  80.0 - 100.0 fL Final  . [facility] 06/19/2017  30.3  26.0 - 34.0 pg Final  . MCHC 06/19/2017 34.0  32.0 - 36.0 g/dL Final  . RDW 06/19/2017 14.8* 11.5 - 14.5 % Final  . Platelets 06/19/2017 305  150 - 440 K/uL Final  . Neutrophils Relative % 06/19/2017 76  % Final  . Neutro Abs 06/19/2017 6.3  1.4 - 6.5 K/uL Final  . Lymphocytes Relative 06/19/2017 16  % Final  . Lymphs Abs 06/19/2017 1.3  1.0 - 3.6 K/uL Final  . Monocytes Relative 06/19/2017 7  % Final  . Monocytes Absolute 06/19/2017 0.6  0.2 - 1.0 K/uL Final  . Eosinophils Relative 06/19/2017 1  % Final  . Eosinophils Absolute 06/19/2017 0.1  0 - 0.7 K/uL Final  . Basophils Relative 06/19/2017 0  % Final  . Basophils Absolute 06/19/2017 0.0  0 - 0.1 K/uL Final   Performed at [facility], [address] [place], [place] [address]     STUDIES: No results found.  ASSESSMENT:  Prostate cancer with retroperitoneal adenopathy.  PLAN:     1. Prostate cancer with retroperitoneal adenopathy: CT scan and nuclear med bone scan from April 01, 2017 essentially revealed stable disease.  Despite treatment with Zytiga and prednisone, patient's PSA continues to trend up and is now 26.44.  Fabio Asa has been discontinued.   Proceed with cycle 1 of cabazitaxel today. Will hold Udenyca for cycle 1, but will consider adding it for subsequent cycles if patient becomes neutropenic.  Patient will also receive Zometa on the odd-numbered cycles.  Return to clinic in 3 weeks for further evaluation and consideration of cycle 2. 2.  Hip pain: Patient was given a referral to radiation oncology.  He was also given a prescription for hydrocodone today. 3.  Anemia: Patient's hemoglobin slightly trended down, monitor. 4.  Constipation: Continue OTC treatments. 5.  Poor appetite: Improved.  Continue Megace as needed.  Approximately 30 minutes was spent in discussion of which greater than 50% was consultation.  Patient expressed understanding and was in agreement with this plan. He also understands that He can call clinic at any time with any questions, concerns, or complaints.    Lloyd Huger, MD   04/26/2015 8:16 AM

## 2017-06-18 ENCOUNTER — Other Ambulatory Visit: Payer: Self-pay | Admitting: Oncology

## 2017-06-19 ENCOUNTER — Inpatient Hospital Stay: Payer: Medicare Other

## 2017-06-19 ENCOUNTER — Inpatient Hospital Stay (HOSPITAL_BASED_OUTPATIENT_CLINIC_OR_DEPARTMENT_OTHER): Payer: Medicare Other | Admitting: Oncology

## 2017-06-19 ENCOUNTER — Encounter: Payer: Self-pay | Admitting: Oncology

## 2017-06-19 ENCOUNTER — Other Ambulatory Visit: Payer: Self-pay | Admitting: *Deleted

## 2017-06-19 ENCOUNTER — Other Ambulatory Visit: Payer: Self-pay

## 2017-06-19 ENCOUNTER — Inpatient Hospital Stay: Payer: Medicare Other | Attending: Oncology

## 2017-06-19 VITALS — BP 150/80 | HR 81 | Temp 98.5°F | Resp 16 | Wt 202.2 lb

## 2017-06-19 DIAGNOSIS — Z5111 Encounter for antineoplastic chemotherapy: Secondary | ICD-10-CM | POA: Diagnosis not present

## 2017-06-19 DIAGNOSIS — D649 Anemia, unspecified: Secondary | ICD-10-CM | POA: Insufficient documentation

## 2017-06-19 DIAGNOSIS — C61 Malignant neoplasm of prostate: Secondary | ICD-10-CM

## 2017-06-19 DIAGNOSIS — K5900 Constipation, unspecified: Secondary | ICD-10-CM | POA: Insufficient documentation

## 2017-06-19 DIAGNOSIS — Z79899 Other long term (current) drug therapy: Secondary | ICD-10-CM | POA: Diagnosis not present

## 2017-06-19 DIAGNOSIS — Z7982 Long term (current) use of aspirin: Secondary | ICD-10-CM | POA: Diagnosis not present

## 2017-06-19 DIAGNOSIS — M25559 Pain in unspecified hip: Secondary | ICD-10-CM | POA: Insufficient documentation

## 2017-06-19 DIAGNOSIS — F17210 Nicotine dependence, cigarettes, uncomplicated: Secondary | ICD-10-CM | POA: Diagnosis not present

## 2017-06-19 LAB — CBC WITH DIFFERENTIAL/PLATELET
Basophils Absolute: 0 10*3/uL (ref 0–0.1)
Basophils Relative: 0 %
EOS ABS: 0.1 10*3/uL (ref 0–0.7)
EOS PCT: 1 %
HCT: 31.3 % — ABNORMAL LOW (ref 40.0–52.0)
Hemoglobin: 10.6 g/dL — ABNORMAL LOW (ref 13.0–18.0)
Lymphocytes Relative: 16 %
Lymphs Abs: 1.3 10*3/uL (ref 1.0–3.6)
MCH: 30.3 pg (ref 26.0–34.0)
MCHC: 34 g/dL (ref 32.0–36.0)
MCV: 89.3 fL (ref 80.0–100.0)
MONO ABS: 0.6 10*3/uL (ref 0.2–1.0)
MONOS PCT: 7 %
Neutro Abs: 6.3 10*3/uL (ref 1.4–6.5)
Neutrophils Relative %: 76 %
PLATELETS: 305 10*3/uL (ref 150–440)
RBC: 3.5 MIL/uL — AB (ref 4.40–5.90)
RDW: 14.8 % — AB (ref 11.5–14.5)
WBC: 8.3 10*3/uL (ref 3.8–10.6)

## 2017-06-19 LAB — COMPREHENSIVE METABOLIC PANEL
ALT: 9 U/L — AB (ref 17–63)
AST: 17 U/L (ref 15–41)
Albumin: 3.1 g/dL — ABNORMAL LOW (ref 3.5–5.0)
Alkaline Phosphatase: 57 U/L (ref 38–126)
Anion gap: 8 (ref 5–15)
BUN: 15 mg/dL (ref 6–20)
CO2: 22 mmol/L (ref 22–32)
CREATININE: 1.01 mg/dL (ref 0.61–1.24)
Calcium: 8.3 mg/dL — ABNORMAL LOW (ref 8.9–10.3)
Chloride: 108 mmol/L (ref 101–111)
GFR calc Af Amer: >60 mL/min (ref >60)
GLUCOSE: 111 mg/dL — AB (ref 65–99)
Potassium: 4.3 mmol/L (ref 3.5–5.1)
Sodium: 138 mmol/L (ref 135–145)
Total Bilirubin: 0.5 mg/dL (ref 0.3–1.2)
Total Protein: 6.5 g/dL (ref 6.5–8.1)

## 2017-06-19 MED ORDER — SODIUM CHLORIDE 0.9 % IV SOLN
25.0000 mg/m2 | Freq: Once | INTRAVENOUS | Status: AC
  Administered 2017-06-19: 52 mg via INTRAVENOUS
  Filled 2017-06-19: qty 5.2

## 2017-06-19 MED ORDER — DIPHENHYDRAMINE HCL 50 MG/ML IJ SOLN
25.0000 mg | Freq: Once | INTRAMUSCULAR | Status: AC
  Administered 2017-06-19: 25 mg via INTRAVENOUS
  Filled 2017-06-19: qty 1

## 2017-06-19 MED ORDER — FAMOTIDINE IN NACL 20-0.9 MG/50ML-% IV SOLN
20.0000 mg | Freq: Once | INTRAVENOUS | Status: AC
  Administered 2017-06-19: 20 mg via INTRAVENOUS
  Filled 2017-06-19: qty 50

## 2017-06-19 MED ORDER — DEXAMETHASONE SODIUM PHOSPHATE 10 MG/ML IJ SOLN
10.0000 mg | Freq: Once | INTRAMUSCULAR | Status: AC
  Administered 2017-06-19: 10 mg via INTRAVENOUS
  Filled 2017-06-19: qty 1

## 2017-06-19 MED ORDER — DEXTROSE 5 % IV SOLN: 25.0000 mg/m2 | Freq: Once | INTRAVENOUS | Status: DC

## 2017-06-19 MED ORDER — SODIUM CHLORIDE 0.9 % IV SOLN: 10.0000 mg | Freq: Once | INTRAVENOUS | Status: DC

## 2017-06-19 MED ORDER — SODIUM CHLORIDE 0.9 % IV SOLN
Freq: Once | INTRAVENOUS | Status: AC
  Administered 2017-06-19: 11:00:00 via INTRAVENOUS
  Filled 2017-06-19: qty 1000

## 2017-06-19 MED ORDER — HYDROCODONE-ACETAMINOPHEN 5-325 MG PO TABS: 1.0000 | ORAL_TABLET | Freq: Four times a day (QID) | ORAL | 0 refills | Status: DC | PRN

## 2017-06-19 MED ORDER — ZOLEDRONIC ACID 4 MG/100ML IV SOLN
4.0000 mg | Freq: Once | INTRAVENOUS | Status: AC
  Administered 2017-06-19: 4 mg via INTRAVENOUS
  Filled 2017-06-19: qty 100

## 2017-06-25 ENCOUNTER — Other Ambulatory Visit: Payer: Self-pay | Admitting: *Deleted

## 2017-06-25 DIAGNOSIS — C61 Malignant neoplasm of prostate: Secondary | ICD-10-CM

## 2017-06-26 ENCOUNTER — Inpatient Hospital Stay: Payer: Medicare Other

## 2017-06-26 ENCOUNTER — Telehealth: Payer: Self-pay | Admitting: *Deleted

## 2017-06-26 DIAGNOSIS — Z5111 Encounter for antineoplastic chemotherapy: Secondary | ICD-10-CM | POA: Diagnosis not present

## 2017-06-26 DIAGNOSIS — C61 Malignant neoplasm of prostate: Secondary | ICD-10-CM

## 2017-06-26 LAB — CBC WITH DIFFERENTIAL/PLATELET
Basophils Absolute: 0 10*3/uL (ref 0–0.1)
Basophils Relative: 1 %
EOS ABS: 0 10*3/uL (ref 0–0.7)
EOS PCT: 2 %
HCT: 28.4 % — ABNORMAL LOW (ref 40.0–52.0)
HEMOGLOBIN: 9.5 g/dL — AB (ref 13.0–18.0)
LYMPHS ABS: 0.9 10*3/uL — AB (ref 1.0–3.6)
Lymphocytes Relative: 47 %
MCH: 29.5 pg (ref 26.0–34.0)
MCHC: 33.4 g/dL (ref 32.0–36.0)
MCV: 88.4 fL (ref 80.0–100.0)
Monocytes Absolute: 0 10*3/uL — ABNORMAL LOW (ref 0.2–1.0)
Monocytes Relative: 3 %
NEUTROS PCT: 47 %
Neutro Abs: 0.9 10*3/uL — ABNORMAL LOW (ref 1.4–6.5)
Platelets: 324 10*3/uL (ref 150–440)
RBC: 3.21 MIL/uL — AB (ref 4.40–5.90)
RDW: 15.1 % — ABNORMAL HIGH (ref 11.5–14.5)
WBC: 1.9 10*3/uL — AB (ref 3.8–10.6)

## 2017-06-26 LAB — COMPREHENSIVE METABOLIC PANEL
ALK PHOS: 56 U/L (ref 38–126)
ALT: 24 U/L (ref 17–63)
AST: 26 U/L (ref 15–41)
Albumin: 3 g/dL — ABNORMAL LOW (ref 3.5–5.0)
Anion gap: 7 (ref 5–15)
BILIRUBIN TOTAL: 0.5 mg/dL (ref 0.3–1.2)
BUN: 18 mg/dL (ref 6–20)
CALCIUM: 7.9 mg/dL — AB (ref 8.9–10.3)
CO2: 22 mmol/L (ref 22–32)
CREATININE: 1.13 mg/dL (ref 0.61–1.24)
Chloride: 105 mmol/L (ref 101–111)
GFR calc Af Amer: >60 mL/min (ref >60)
GFR, EST NON AFRICAN AMERICAN: 59 mL/min — AB (ref >60)
Glucose, Bld: 126 mg/dL — ABNORMAL HIGH (ref 65–99)
Potassium: 3.8 mmol/L (ref 3.5–5.1)
Sodium: 134 mmol/L — ABNORMAL LOW (ref 135–145)
Total Protein: 6.7 g/dL (ref 6.5–8.1)

## 2017-06-26 LAB — PSA: PROSTATIC SPECIFIC ANTIGEN: 19.05 ng/mL — AB (ref 0.00–4.00)

## 2017-06-26 NOTE — Telephone Encounter (Signed)
Agency called to clarify physical therapy orders. She stated the order are on hold until completion of his radiation therapy. She would like an eventual start date. Colletta Maryland [phone]

## 2017-06-26 NOTE — Telephone Encounter (Signed)
Ana spoke with Colletta Maryland and clarified orders for PT, hold until after radiation due to pain.

## 2017-07-01 ENCOUNTER — Other Ambulatory Visit: Payer: Self-pay

## 2017-07-01 ENCOUNTER — Ambulatory Visit
Admission: RE | Admit: 2017-07-01 | Discharge: 2017-07-01 | Disposition: A | Discharge status: Home / Self Care | Payer: Medicare Other | Source: Ambulatory Visit | Attending: Radiation Oncology | Admitting: Radiation Oncology

## 2017-07-01 ENCOUNTER — Encounter: Payer: Self-pay | Admitting: Radiation Oncology

## 2017-07-01 VITALS — BP 111/69 | HR 109 | Temp 98.0°F | Resp 18 | Wt 195.8 lb

## 2017-07-01 DIAGNOSIS — C7982 Secondary malignant neoplasm of genital organs: Secondary | ICD-10-CM | POA: Diagnosis not present

## 2017-07-01 DIAGNOSIS — C7951 Secondary malignant neoplasm of bone: Secondary | ICD-10-CM | POA: Diagnosis not present

## 2017-07-01 DIAGNOSIS — M25551 Pain in right hip: Secondary | ICD-10-CM | POA: Diagnosis not present

## 2017-07-01 DIAGNOSIS — I10 Essential (primary) hypertension: Secondary | ICD-10-CM | POA: Insufficient documentation

## 2017-07-01 DIAGNOSIS — C61 Malignant neoplasm of prostate: Secondary | ICD-10-CM | POA: Insufficient documentation

## 2017-07-01 DIAGNOSIS — F17210 Nicotine dependence, cigarettes, uncomplicated: Secondary | ICD-10-CM | POA: Insufficient documentation

## 2017-07-01 DIAGNOSIS — C7911 Secondary malignant neoplasm of bladder: Secondary | ICD-10-CM | POA: Diagnosis not present

## 2017-07-01 DIAGNOSIS — Z79899 Other long term (current) drug therapy: Secondary | ICD-10-CM | POA: Diagnosis not present

## 2017-07-01 NOTE — Consult Note (Signed)
NEW PATIENT EVALUATION  Name: Terry Wood.  MRN: [id number]  Date:   07/01/2017     DOB: [birth year]   This 84 y.o. male patient presents to the clinic for initial evaluation of stage IV prostate cancer with right hip pain.  REFERRING PHYSICIAN: Joyice Faster, FNP  CHIEF COMPLAINT:  Chief Complaint  Patient presents with  . Prostate Cancer    Initial Eval    DIAGNOSIS: The encounter diagnosis was Primary prostate cancer with metastasis from prostate to other site [facility]).   PREVIOUS INVESTIGATIONS:  One scan and CT scans reviewed Pathology report reviewed Clinical notes reviewed  HPI: patient is an 84 year old male diagnosed with stage IVdiagnosed in 2016 be treated with multiple chemotherapy and androgen deprivation treatments for bone involvement.he is currently onCabazitaxel. He does have by CT criteria invasion of the base of bladder byprostate cancer as well as pelvic metastatic adenopathy.he has been treated in the past with Zytiga and prednisone. He's recent start on hydrocodone for increasing right hip pain. He states that it radiates down towards his knee and involves the right iliac crest. Bone scan shows involvement of this right SI joint L5 as well as right iliac crest with increasing uptake. He is now referred to radiation oncology for consideration of palliative treatment. He is ambulating with the assistance of a walker.      PLANNED TREATMENT REGIMEN: palliative treatment to right hemipelvis  PAST MEDICAL HISTORY:  has a past medical history of Cancer of prostate ([place]) (07/13/2014) and Hypertension.    PAST SURGICAL HISTORY: History reviewed. No pertinent surgical history.  FAMILY HISTORY: family history is not on file.  SOCIAL HISTORY:  reports that he has been smoking.  He has never used smokeless tobacco. He reports that he drinks alcohol. He reports that he does not use drugs.  ALLERGIES: Patient has no known allergies.  MEDICATIONS:  Current  Outpatient Medications  Medication Sig Dispense Refill  . abiraterone acetate (ZYTIGA) 250 MG tablet Take 4 tablets (1,000 mg total) by mouth daily. Take on an empty stomach 1 hour before or 2 hours after a meal 120 tablet 3  . allopurinol (ZYLOPRIM) 100 MG tablet     . aspirin 325 MG EC tablet Take 325 mg by mouth daily.    . fluvastatin (LESCOL) 20 MG capsule Take 20 mg by mouth at bedtime.    . hydrochlorothiazide (HYDRODIURIL) 12.5 MG tablet   1  . HYDROcodone-acetaminophen (NORCO/VICODIN) 5-325 MG tablet Take 1 tablet by mouth every 6 (six) hours as needed for moderate pain. 30 tablet 0  . Omega-3 Fatty Acids (FISH OIL) 1000 MG CAPS Take by mouth 4 (four) times daily.    . predniSONE (DELTASONE) 10 MG tablet Take 1 tablet (10 mg total) by mouth daily. (Patient not taking: Reported on 06/19/2017) 21 tablet 3  . predniSONE (DELTASONE) 20 MG tablet 20 mg 2 (two) times daily with a meal.     . predniSONE (DELTASONE) 5 MG tablet Take 1 tablet (5 mg total) by mouth 2 (two) times daily with a meal. (Patient not taking: Reported on 06/19/2017) 60 tablet 5  . tamsulosin (FLOMAX) 0.4 MG CAPS capsule Take 0.4 mg by mouth 2 (two) times daily.     No current facility-administered medications for this encounter.    Facility-Administered Medications Ordered in Other Encounters  Medication Dose Route Frequency Provider Last Rate Last Dose  . ondansetron (ZOFRAN) injection 8 mg  8 mg Intravenous Once Lloyd Huger, MD  ECOG PERFORMANCE STATUS:  1 - Symptomatic but completely ambulatory  REVIEW OF SYSTEMS: except for the difficulty with pain and constipation  Patient denies any weight loss, fatigue, weakness, fever, chills or night sweats. Patient denies any loss of vision, blurred vision. Patient denies any ringing  of the ears or hearing loss. No irregular heartbeat. Patient denies heart murmur or history of fainting. Patient denies any chest pain or pain radiating to her upper extremities.  Patient denies any shortness of breath, difficulty breathing at night, cough or hemoptysis. Patient denies any swelling in the lower legs. Patient denies any nausea vomiting, vomiting of blood, or coffee ground material in the vomitus. Patient denies any stomach pain. Patient states has had normal bowel movements no significant constipation or diarrhea. Patient denies any dysuria, hematuria or significant nocturia. Patient denies any problems walking, swelling in the joints or loss of balance. Patient denies any skin changes, loss of hair or loss of weight. Patient denies any excessive worrying or anxiety or significant depression. Patient denies any problems with insomnia. Patient denies excessive thirst, polyuria, polydipsia. Patient denies any swollen glands, patient denies easy bruising or easy bleeding. Patient denies any recent infections, allergies or URI. Patient "s visual fields have not changed significantly in recent time.   PHYSICAL EXAM: BP 111/69   Pulse (!) 109   Temp 98 F (36.7 C)   Resp 18   Wt 195 lb 12.3 oz (88.8 kg)   BMI 29.77 kg/m  Well-developed elderly male in NAD. Range of motion of his right lower extremity does not elicit pain motor sensory and DTR levels are equal and symmetric in the upper lower extremities. Deep palpation of his lumbar spine does not elicit pain. Well-developed well-nourished patient in NAD. HEENT reveals PERLA, EOMI, discs not visualized.  Oral cavity is clear. No oral mucosal lesions are identified. Neck is clear without evidence of cervical or supraclavicular adenopathy. Lungs are clear to A&P. Cardiac examination is essentially unremarkable with regular rate and rhythm without murmur rub or thrill. Abdomen is benign with no organomegaly or masses noted. Motor sensory and DTR levels are equal and symmetric in the upper and lower extremities. Cranial nerves II through XII are grossly intact. Proprioception is intact. No peripheral adenopathy or edema is  identified. No motor or sensory levels are noted. Crude visual fields are within normal range.  LABORATORY DATA: no currentathology for review   RADIOLOGY RESULTS:bone scan and CT scans reviewed and compatible with the above-stated findings   IMPRESSION: stage IV breast cancerin 84 year old male probably hormone refractory at this time  PLAN: December 20 head with palliative radiation therapy to his right hemi-pelvis. Would treat the area of right iliac crest lumbar spine and SI joint up to 3000 cGy in 10 fractions. Risks and benefits of treatment including possible increased lower urinary tract symptoms diarrhea fatigue alteration of blood counts and skin reaction all were discussed in detail with the patient and his daughter-in-law. I have person set up and ordered CT simulation. Patient in the future might benefit fromXofigo treatments. I first was up and ordered CT simulation for tomorrow.  I would like to take this opportunity to thank you for allowing me to participate in the care of your patient.Noreene Filbert, MD

## 2017-07-02 ENCOUNTER — Ambulatory Visit
Admission: RE | Admit: 2017-07-02 | Discharge: 2017-07-02 | Disposition: A | Discharge status: Home / Self Care | Payer: Medicare Other | Source: Ambulatory Visit | Attending: Radiation Oncology | Admitting: Radiation Oncology

## 2017-07-02 ENCOUNTER — Encounter: Payer: Self-pay | Admitting: Oncology

## 2017-07-02 ENCOUNTER — Ambulatory Visit: Payer: Medicare Other | Admitting: Radiation Oncology

## 2017-07-02 DIAGNOSIS — F17200 Nicotine dependence, unspecified, uncomplicated: Secondary | ICD-10-CM | POA: Diagnosis not present

## 2017-07-02 DIAGNOSIS — Z79899 Other long term (current) drug therapy: Secondary | ICD-10-CM | POA: Diagnosis not present

## 2017-07-02 DIAGNOSIS — C61 Malignant neoplasm of prostate: Secondary | ICD-10-CM | POA: Insufficient documentation

## 2017-07-02 DIAGNOSIS — Z7982 Long term (current) use of aspirin: Secondary | ICD-10-CM | POA: Insufficient documentation

## 2017-07-02 DIAGNOSIS — C7951 Secondary malignant neoplasm of bone: Secondary | ICD-10-CM | POA: Insufficient documentation

## 2017-07-02 DIAGNOSIS — I10 Essential (primary) hypertension: Secondary | ICD-10-CM | POA: Diagnosis not present

## 2017-07-02 DIAGNOSIS — Z7952 Long term (current) use of systemic steroids: Secondary | ICD-10-CM | POA: Insufficient documentation

## 2017-07-02 DIAGNOSIS — M25551 Pain in right hip: Secondary | ICD-10-CM | POA: Diagnosis not present

## 2017-07-03 ENCOUNTER — Ambulatory Visit: Payer: Medicare Other

## 2017-07-03 DIAGNOSIS — C61 Malignant neoplasm of prostate: Secondary | ICD-10-CM | POA: Diagnosis not present

## 2017-07-04 ENCOUNTER — Ambulatory Visit: Payer: Medicare Other

## 2017-07-07 ENCOUNTER — Ambulatory Visit: Payer: Medicare Other

## 2017-07-08 ENCOUNTER — Other Ambulatory Visit: Payer: Self-pay | Admitting: Oncology

## 2017-07-08 ENCOUNTER — Ambulatory Visit
Admission: RE | Admit: 2017-07-08 | Discharge: 2017-07-08 | Disposition: A | Discharge status: Home / Self Care | Payer: Medicare Other | Source: Ambulatory Visit | Attending: Radiation Oncology | Admitting: Radiation Oncology

## 2017-07-08 ENCOUNTER — Ambulatory Visit: Payer: Medicare Other

## 2017-07-08 DIAGNOSIS — Z510 Encounter for antineoplastic radiation therapy: Secondary | ICD-10-CM | POA: Insufficient documentation

## 2017-07-08 DIAGNOSIS — C7951 Secondary malignant neoplasm of bone: Secondary | ICD-10-CM | POA: Insufficient documentation

## 2017-07-08 DIAGNOSIS — C61 Malignant neoplasm of prostate: Secondary | ICD-10-CM | POA: Diagnosis present

## 2017-07-08 NOTE — Progress Notes (Signed)
[place]  [phone]  Fax:([phone]) [place] DOB: 06/02/1933  MR#: [id number]  XTG#:[id number]  Patient Care Team: Joyice Faster, FNP as PCP - General (Family Medicine)   CHIEF COMPLAINT: Prostate cancer with retroperitoneal adenopathy.  INTERVAL HISTORY: Patient returns to clinic today for further evaluation and consideration of cycle 2 of Cabazitaxel.  He tolerated his first infusion well without significant side effects.  He continues to have right hip pain, but only started XRT several days ago.  He otherwise feels well.  He has no neurologic complaints.  He denies any recent fevers or illnesses. He denies any chest pain or shortness of breath.  He denies any nausea, vomiting, constipation, or diarrhea.  He has no urinary complaints.  Patient offers no further specific complaints today.  REVIEW OF SYSTEMS:   Review of Systems  Constitutional: Negative for diaphoresis, fever, malaise/fatigue and weight loss.  Respiratory: Negative.  Negative for cough and shortness of breath.   Cardiovascular: Negative.  Negative for chest pain and leg swelling.  Gastrointestinal: Negative.  Negative for abdominal pain, constipation, nausea and vomiting.  Genitourinary: Negative.  Negative for flank pain and frequency.  Musculoskeletal: Positive for back pain and joint pain. Negative for falls.  Skin: Negative.  Negative for rash.  Neurological: Negative.  Negative for dizziness, sensory change, focal weakness and weakness.  Psychiatric/Behavioral: Negative.  The patient is not nervous/anxious.     As per HPI. Otherwise, a complete review of systems is negative.  ONCOLOGY HISTORY: Oncology History   1. prostate cancer, extra capsule extention, also illiac and retroperitoneal adenopathy on ct, small lesion liver possible metastatic, minimal uptake one area t spine. Asymptomatic. Not a surgical candidate with CT findings. Considdered not bulk and  not visceral disease. Psa 13.6 prior to tx...casodex , then lupron first dose 12/31     Primary prostate cancer with metastasis from prostate to other site [facility])   06/04/2017 -  Chemotherapy    The patient had pegfilgrastim-cbqv (UDENYCA) injection 6 mg, 6 mg, Subcutaneous, Once, 1 of 5 cycles cabazitaxel (JEVTANA) 52 mg in dextrose 5 % 250 mL chemo infusion, 25 mg/m2 = 52 mg, Intravenous,  Once, 2 of 6 cycles Administration: 52 mg (07/09/2017)  for chemotherapy treatment.        PAST MEDICAL HISTORY: Past Medical History:  Diagnosis Date  . Cancer of prostate ([place]) 07/13/2014  . Hypertension     PAST SURGICAL HISTORY: No past surgical history on file.  FAMILY HISTORY: Reviewed and unchanged. No reported history of malignancy or chronic disease.    ADVANCED DIRECTIVES:    HEALTH MAINTENANCE: Social History   Tobacco Use  . Smoking status: Current Every Day Smoker  . Smokeless tobacco: Never Used  Substance Use Topics  . Alcohol use: Yes    Alcohol/week: 0.0 oz  . Drug use: No     No Known Allergies  Current Outpatient Medications  Medication Sig Dispense Refill  . allopurinol (ZYLOPRIM) 100 MG tablet Take 100 mg by mouth daily.     Marland Kitchen aspirin EC 81 MG tablet Take 81 mg by mouth daily.    . fluvastatin (LESCOL) 20 MG capsule Take 20 mg by mouth at bedtime.    . hydrochlorothiazide (HYDRODIURIL) 12.5 MG tablet Take 12.5 mg by mouth daily.   1  . Omega-3 Fatty Acids (FISH OIL) 1000 MG CAPS Take by mouth 4 (four) times daily.     . tamsulosin (FLOMAX) 0.4  MG CAPS capsule Take 0.4 mg by mouth 2 (two) times daily.    Marland Kitchen HYDROcodone-acetaminophen (NORCO/VICODIN) 5-325 MG tablet Take 1 tablet by mouth every 6 (six) hours as needed for moderate pain. (Patient not taking: Reported on 07/09/2017) 30 tablet 0   No current facility-administered medications for this visit.    Facility-Administered Medications Ordered in Other Visits  Medication Dose Route Frequency Provider Last  Rate Last Dose  . ondansetron (ZOFRAN) injection 8 mg  8 mg Intravenous Once Lloyd Huger, MD        OBJECTIVE: BP 123/70 (BP Location: Left Arm, Patient Position: Sitting)   Pulse 82   Temp (!) 95.6 F (35.3 C) (Tympanic)   Resp 18   Wt 191 lb 6.4 oz (86.8 kg)   BMI 29.10 kg/m    Body mass index is 29.1 kg/m.    ECOG FS:0 - Asymptomatic  General: Well-developed, well-nourished, no acute distress. Eyes: Pink conjunctiva, anicteric sclera. Lungs: Clear to auscultation bilaterally. Heart: Regular rate and rhythm. No rubs, murmurs, or gallops. Abdomen: Soft, nontender, nondistended. No organomegaly noted, normoactive bowel sounds. Musculoskeletal: No edema, cyanosis, or clubbing. Neuro: Alert, answering all questions appropriately. Cranial nerves grossly intact. Skin: No rashes or petechiae noted. Psych: Normal affect.  LAB RESULTS:  Orders Only on 07/09/2017  Component Date Value Ref Range Status  . Sodium 07/09/2017 136  135 - 145 mmol/L Final  . Potassium 07/09/2017 4.0  3.5 - 5.1 mmol/L Final  . Chloride 07/09/2017 104  98 - 111 mmol/L Final   Please note change in reference range.  . CO2 07/09/2017 22  22 - 32 mmol/L Final  . Glucose, Bld 07/09/2017 156* 70 - 99 mg/dL Final   Please note change in reference range.  . BUN 07/09/2017 23  8 - 23 mg/dL Final   Please note change in reference range.  . Creatinine, Ser 07/09/2017 1.31* 0.61 - 1.24 mg/dL Final  . Calcium 07/09/2017 8.7* 8.9 - 10.3 mg/dL Final  . Total Protein 07/09/2017 7.4  6.5 - 8.1 g/dL Final  . Albumin 07/09/2017 3.2* 3.5 - 5.0 g/dL Final  . AST 07/09/2017 23  15 - 41 U/L Final  . ALT 07/09/2017 16  0 - 44 U/L Final   Please note change in reference range.  . Alkaline Phosphatase 07/09/2017 67  38 - 126 U/L Final  . Total Bilirubin 07/09/2017 0.4  0.3 - 1.2 mg/dL Final  . GFR calc non Af Amer 07/09/2017 49* >60 mL/min Final  . GFR calc Af Amer 07/09/2017 57* >60 mL/min Final   Comment:  (NOTE) The eGFR has been calculated using the CKD EPI equation. This calculation has not been validated in all clinical situations. eGFR's persistently <60 mL/min signify possible Chronic Kidney Disease.   Georgiann Hahn gap 07/09/2017 10  5 - 15 Final   Performed at [facility], [address] [place], [place] [address]  . WBC 07/09/2017 12.6* 3.8 - 10.6 K/uL Final  . RBC 07/09/2017 3.50* 4.40 - 5.90 MIL/uL Final  . Hemoglobin 07/09/2017 10.5* 13.0 - 18.0 g/dL Final  . HCT 07/09/2017 30.5* 40.0 - 52.0 % Final  . MCV 07/09/2017 87.3  80.0 - 100.0 fL Final  . MCH 07/09/2017 29.9  26.0 - 34.0 pg Final  . MCHC 07/09/2017 34.3  32.0 - 36.0 g/dL Final  . RDW 07/09/2017 15.0* 11.5 - 14.5 % Final  . Platelets 07/09/2017 508* 150 - 440 K/uL Final  . Neutrophils Relative % 07/09/2017 75  %  Final  . Neutro Abs 07/09/2017 9.6* 1.4 - 6.5 K/uL Final  . Lymphocytes Relative 07/09/2017 17  % Final  . Lymphs Abs 07/09/2017 2.1  1.0 - 3.6 K/uL Final  . Monocytes Relative 07/09/2017 7  % Final  . Monocytes Absolute 07/09/2017 0.8  0.2 - 1.0 K/uL Final  . Eosinophils Relative 07/09/2017 0  % Final  . Eosinophils Absolute 07/09/2017 0.0  0 - 0.7 K/uL Final  . Basophils Relative 07/09/2017 1  % Final  . Basophils Absolute 07/09/2017 0.1  0 - 0.1 K/uL Final   Performed at [facility], [address], [place], [place] [address]  . Prostatic Specific Antigen 07/09/2017 21.93* 0.00 - 4.00 ng/mL Final   Comment: (NOTE) While PSA levels of <=4.0 ng/ml are reported as reference range, some men with levels below 4.0 ng/ml can have prostate cancer and many men with PSA above 4.0 ng/ml do not have prostate cancer.  Other tests such as free PSA, age specific reference ranges, PSA velocity and PSA doubling time may be helpful especially in men less than 81 years old. Performed at [facility], [place] [address], [place], [place] [address]      STUDIES: No results found.  ASSESSMENT:   Prostate cancer with retroperitoneal adenopathy.  PLAN:     1. Prostate cancer with retroperitoneal adenopathy: CT scan and nuclear med bone scan from April 01, 2017 essentially revealed stable disease.  Despite treatment with Zytiga and prednisone, patient's PSA trended up to 26.44.  Fabio Asa has been discontinued.  PSA initially decreased, but now has trended up slightly to 21.93.  Will continue current treatment as planned, but patient expressed understanding that his treatment options are limited.  Proceed with cycle 2 of cabazitaxel today. Will continue to hold Udenyca, but will consider adding it for subsequent cycles if patient becomes neutropenic.  Patient will also receive Zometa on the odd-numbered cycles.  Return to clinic in 3 weeks for further evaluation and consideration of cycle 2. 2.  Hip pain: Patient recently initiated XRT.  Continue hydrocodone for symptomatic relief.   3.  Anemia: Hemoglobin slightly improved to 10.5.  Monitor. 4.  Constipation: Continue OTC treatments. 5.  Poor appetite: Improved.  Continue Megace as needed. 6.  Thrombocytosis: Likely reactive, monitor. 7.  Renal insufficiency: Patient's creatinine has trended up slightly to 1.31, monitor.  Patient expressed understanding and was in agreement with this plan. He also understands that He can call clinic at any time with any questions, concerns, or complaints.    Lloyd Huger, MD   04/26/2015 7:02 AM

## 2017-07-09 ENCOUNTER — Other Ambulatory Visit: Payer: Self-pay

## 2017-07-09 ENCOUNTER — Inpatient Hospital Stay (HOSPITAL_BASED_OUTPATIENT_CLINIC_OR_DEPARTMENT_OTHER): Payer: Medicare Other | Admitting: Oncology

## 2017-07-09 ENCOUNTER — Inpatient Hospital Stay: Payer: Medicare Other | Attending: Oncology

## 2017-07-09 ENCOUNTER — Ambulatory Visit: Payer: Medicare Other

## 2017-07-09 ENCOUNTER — Inpatient Hospital Stay: Payer: Medicare Other

## 2017-07-09 ENCOUNTER — Ambulatory Visit
Admission: RE | Admit: 2017-07-09 | Discharge: 2017-07-09 | Disposition: A | Discharge status: Home / Self Care | Payer: Medicare Other | Source: Ambulatory Visit | Attending: Radiation Oncology | Admitting: Radiation Oncology

## 2017-07-09 VITALS — BP 123/70 | HR 82 | Temp 95.6°F | Resp 18 | Wt 191.4 lb

## 2017-07-09 DIAGNOSIS — R197 Diarrhea, unspecified: Secondary | ICD-10-CM | POA: Insufficient documentation

## 2017-07-09 DIAGNOSIS — R634 Abnormal weight loss: Secondary | ICD-10-CM | POA: Insufficient documentation

## 2017-07-09 DIAGNOSIS — F17200 Nicotine dependence, unspecified, uncomplicated: Secondary | ICD-10-CM | POA: Diagnosis not present

## 2017-07-09 DIAGNOSIS — Z5111 Encounter for antineoplastic chemotherapy: Secondary | ICD-10-CM | POA: Diagnosis present

## 2017-07-09 DIAGNOSIS — N189 Chronic kidney disease, unspecified: Secondary | ICD-10-CM | POA: Insufficient documentation

## 2017-07-09 DIAGNOSIS — C61 Malignant neoplasm of prostate: Secondary | ICD-10-CM

## 2017-07-09 DIAGNOSIS — N289 Disorder of kidney and ureter, unspecified: Secondary | ICD-10-CM | POA: Insufficient documentation

## 2017-07-09 DIAGNOSIS — F17210 Nicotine dependence, cigarettes, uncomplicated: Secondary | ICD-10-CM

## 2017-07-09 DIAGNOSIS — M25559 Pain in unspecified hip: Secondary | ICD-10-CM

## 2017-07-09 DIAGNOSIS — I959 Hypotension, unspecified: Secondary | ICD-10-CM | POA: Diagnosis not present

## 2017-07-09 DIAGNOSIS — D649 Anemia, unspecified: Secondary | ICD-10-CM | POA: Diagnosis not present

## 2017-07-09 DIAGNOSIS — Z923 Personal history of irradiation: Secondary | ICD-10-CM | POA: Diagnosis not present

## 2017-07-09 DIAGNOSIS — Z79899 Other long term (current) drug therapy: Secondary | ICD-10-CM | POA: Insufficient documentation

## 2017-07-09 DIAGNOSIS — R531 Weakness: Secondary | ICD-10-CM | POA: Diagnosis not present

## 2017-07-09 DIAGNOSIS — Z7982 Long term (current) use of aspirin: Secondary | ICD-10-CM | POA: Insufficient documentation

## 2017-07-09 DIAGNOSIS — K5900 Constipation, unspecified: Secondary | ICD-10-CM | POA: Diagnosis not present

## 2017-07-09 DIAGNOSIS — E876 Hypokalemia: Secondary | ICD-10-CM | POA: Diagnosis not present

## 2017-07-09 DIAGNOSIS — Z510 Encounter for antineoplastic radiation therapy: Secondary | ICD-10-CM | POA: Diagnosis not present

## 2017-07-09 LAB — CBC WITH DIFFERENTIAL/PLATELET
Basophils Absolute: 0.1 10*3/uL (ref 0–0.1)
Basophils Relative: 1 %
EOS ABS: 0 10*3/uL (ref 0–0.7)
EOS PCT: 0 %
HCT: 30.5 % — ABNORMAL LOW (ref 40.0–52.0)
Hemoglobin: 10.5 g/dL — ABNORMAL LOW (ref 13.0–18.0)
Lymphocytes Relative: 17 %
Lymphs Abs: 2.1 10*3/uL (ref 1.0–3.6)
MCH: 29.9 pg (ref 26.0–34.0)
MCHC: 34.3 g/dL (ref 32.0–36.0)
MCV: 87.3 fL (ref 80.0–100.0)
MONO ABS: 0.8 10*3/uL (ref 0.2–1.0)
MONOS PCT: 7 %
Neutro Abs: 9.6 10*3/uL — ABNORMAL HIGH (ref 1.4–6.5)
Neutrophils Relative %: 75 %
PLATELETS: 508 10*3/uL — AB (ref 150–440)
RBC: 3.5 MIL/uL — ABNORMAL LOW (ref 4.40–5.90)
RDW: 15 % — AB (ref 11.5–14.5)
WBC: 12.6 10*3/uL — ABNORMAL HIGH (ref 3.8–10.6)

## 2017-07-09 LAB — COMPREHENSIVE METABOLIC PANEL
ALK PHOS: 67 U/L (ref 38–126)
ALT: 16 U/L (ref 0–44)
AST: 23 U/L (ref 15–41)
Albumin: 3.2 g/dL — ABNORMAL LOW (ref 3.5–5.0)
Anion gap: 10 (ref 5–15)
BUN: 23 mg/dL (ref 8–23)
CHLORIDE: 104 mmol/L (ref 98–111)
CO2: 22 mmol/L (ref 22–32)
CREATININE: 1.31 mg/dL — AB (ref 0.61–1.24)
Calcium: 8.7 mg/dL — ABNORMAL LOW (ref 8.9–10.3)
GFR calc Af Amer: 57 mL/min — ABNORMAL LOW (ref >60)
GFR calc non Af Amer: 49 mL/min — ABNORMAL LOW (ref >60)
Glucose, Bld: 156 mg/dL — ABNORMAL HIGH (ref 70–99)
Potassium: 4 mmol/L (ref 3.5–5.1)
SODIUM: 136 mmol/L (ref 135–145)
Total Bilirubin: 0.4 mg/dL (ref 0.3–1.2)
Total Protein: 7.4 g/dL (ref 6.5–8.1)

## 2017-07-09 LAB — PSA: PROSTATIC SPECIFIC ANTIGEN: 21.93 ng/mL — AB (ref 0.00–4.00)

## 2017-07-09 MED ORDER — FAMOTIDINE IN NACL 20-0.9 MG/50ML-% IV SOLN
20.0000 mg | Freq: Once | INTRAVENOUS | Status: AC
  Administered 2017-07-09: 20 mg via INTRAVENOUS
  Filled 2017-07-09: qty 50

## 2017-07-09 MED ORDER — SODIUM CHLORIDE 0.9 % IV SOLN
25.0000 mg/m2 | Freq: Once | INTRAVENOUS | Status: AC
  Administered 2017-07-09: 52 mg via INTRAVENOUS
  Filled 2017-07-09: qty 5.2

## 2017-07-09 MED ORDER — DEXAMETHASONE SODIUM PHOSPHATE 10 MG/ML IJ SOLN
10.0000 mg | Freq: Once | INTRAMUSCULAR | Status: AC
  Administered 2017-07-09: 10 mg via INTRAVENOUS
  Filled 2017-07-09: qty 1

## 2017-07-09 MED ORDER — DIPHENHYDRAMINE HCL 50 MG/ML IJ SOLN
25.0000 mg | Freq: Once | INTRAMUSCULAR | Status: AC
  Administered 2017-07-09: 25 mg via INTRAVENOUS
  Filled 2017-07-09: qty 1

## 2017-07-09 MED ORDER — SODIUM CHLORIDE 0.9 % IV SOLN
Freq: Once | INTRAVENOUS | Status: AC
  Administered 2017-07-09: 12:00:00 via INTRAVENOUS
  Filled 2017-07-09: qty 1000

## 2017-07-09 MED ORDER — SODIUM CHLORIDE 0.9 % IV SOLN: 10.0000 mg | Freq: Once | INTRAVENOUS | Status: DC

## 2017-07-09 NOTE — Progress Notes (Signed)
Here w family for follow up stated overall " feeling ok"

## 2017-07-11 ENCOUNTER — Ambulatory Visit: Payer: Medicare Other

## 2017-07-11 ENCOUNTER — Ambulatory Visit
Admission: RE | Admit: 2017-07-11 | Discharge: 2017-07-11 | Disposition: A | Discharge status: Home / Self Care | Payer: Medicare Other | Source: Ambulatory Visit | Attending: Radiation Oncology | Admitting: Radiation Oncology

## 2017-07-11 DIAGNOSIS — Z510 Encounter for antineoplastic radiation therapy: Secondary | ICD-10-CM | POA: Diagnosis not present

## 2017-07-14 ENCOUNTER — Ambulatory Visit
Admission: RE | Admit: 2017-07-14 | Discharge: 2017-07-14 | Disposition: A | Discharge status: Home / Self Care | Payer: Medicare Other | Source: Ambulatory Visit | Attending: Radiation Oncology | Admitting: Radiation Oncology

## 2017-07-14 ENCOUNTER — Ambulatory Visit: Payer: Medicare Other

## 2017-07-14 DIAGNOSIS — Z510 Encounter for antineoplastic radiation therapy: Secondary | ICD-10-CM | POA: Diagnosis not present

## 2017-07-15 ENCOUNTER — Ambulatory Visit: Payer: Medicare Other

## 2017-07-15 ENCOUNTER — Encounter: Payer: Self-pay | Admitting: Oncology

## 2017-07-15 ENCOUNTER — Ambulatory Visit
Admission: RE | Admit: 2017-07-15 | Discharge: 2017-07-15 | Disposition: A | Discharge status: Home / Self Care | Payer: Medicare Other | Source: Ambulatory Visit | Attending: Radiation Oncology | Admitting: Radiation Oncology

## 2017-07-15 DIAGNOSIS — Z510 Encounter for antineoplastic radiation therapy: Secondary | ICD-10-CM | POA: Diagnosis not present

## 2017-07-16 ENCOUNTER — Ambulatory Visit: Payer: Medicare Other

## 2017-07-16 ENCOUNTER — Ambulatory Visit
Admission: RE | Admit: 2017-07-16 | Discharge: 2017-07-16 | Disposition: A | Discharge status: Home / Self Care | Payer: Medicare Other | Source: Ambulatory Visit | Attending: Radiation Oncology | Admitting: Radiation Oncology

## 2017-07-16 DIAGNOSIS — Z510 Encounter for antineoplastic radiation therapy: Secondary | ICD-10-CM | POA: Diagnosis not present

## 2017-07-17 ENCOUNTER — Ambulatory Visit
Admission: RE | Admit: 2017-07-17 | Discharge: 2017-07-17 | Disposition: A | Discharge status: Home / Self Care | Payer: Medicare Other | Source: Ambulatory Visit | Attending: Radiation Oncology | Admitting: Radiation Oncology

## 2017-07-17 DIAGNOSIS — Z510 Encounter for antineoplastic radiation therapy: Secondary | ICD-10-CM | POA: Diagnosis not present

## 2017-07-18 ENCOUNTER — Ambulatory Visit
Admission: RE | Admit: 2017-07-18 | Discharge: 2017-07-18 | Disposition: A | Discharge status: Home / Self Care | Payer: Medicare Other | Source: Ambulatory Visit | Attending: Radiation Oncology | Admitting: Radiation Oncology

## 2017-07-18 DIAGNOSIS — Z510 Encounter for antineoplastic radiation therapy: Secondary | ICD-10-CM | POA: Diagnosis not present

## 2017-07-21 ENCOUNTER — Ambulatory Visit
Admission: RE | Admit: 2017-07-21 | Discharge: 2017-07-21 | Disposition: A | Discharge status: Home / Self Care | Payer: Medicare Other | Source: Ambulatory Visit | Attending: Radiation Oncology | Admitting: Radiation Oncology

## 2017-07-21 DIAGNOSIS — Z510 Encounter for antineoplastic radiation therapy: Secondary | ICD-10-CM | POA: Diagnosis not present

## 2017-07-22 ENCOUNTER — Ambulatory Visit
Admission: RE | Admit: 2017-07-22 | Discharge: 2017-07-22 | Disposition: A | Discharge status: Home / Self Care | Payer: Medicare Other | Source: Ambulatory Visit | Attending: Radiation Oncology | Admitting: Radiation Oncology

## 2017-07-22 DIAGNOSIS — Z510 Encounter for antineoplastic radiation therapy: Secondary | ICD-10-CM | POA: Diagnosis not present

## 2017-07-28 NOTE — Progress Notes (Signed)
[place]  [phone]  Fax:(336) [place] DOB: 03-Jun-1933  MR#: [id number]  ZCH#:[id number]  Patient Care Team: Joyice Faster, FNP as PCP - General (Family Medicine)   CHIEF COMPLAINT: Prostate cancer with retroperitoneal adenopathy.  INTERVAL HISTORY: Patient returns to clinic today for further evaluation and consideration of cycle 3 of Cabazitaxel.  He continues to have persistent diarrhea, but has not taken any Imodium or other treatment for his symptoms.  He has a poor appetite and weight loss.  His right hip pain has resolved.  He has no neurologic complaints.  He denies any recent fevers or illnesses. He denies any chest pain or shortness of breath.  He denies any nausea or vomiting.  He has no urinary complaints.  Patient offers no further specific complaints today.  REVIEW OF SYSTEMS:   Review of Systems  Constitutional: Positive for malaise/fatigue and weight loss. Negative for diaphoresis and fever.  Respiratory: Negative.  Negative for cough and shortness of breath.   Cardiovascular: Negative.  Negative for chest pain and leg swelling.  Gastrointestinal: Positive for diarrhea. Negative for abdominal pain, constipation, nausea and vomiting.  Genitourinary: Negative.  Negative for flank pain and frequency.  Musculoskeletal: Positive for back pain and joint pain. Negative for falls.  Skin: Negative.  Negative for rash.  Neurological: Positive for weakness. Negative for dizziness, sensory change and focal weakness.  Psychiatric/Behavioral: Negative.  The patient is not nervous/anxious.     As per HPI. Otherwise, a complete review of systems is negative.  ONCOLOGY HISTORY: Oncology History   1. prostate cancer, extra capsule extention, also illiac and retroperitoneal adenopathy on ct, small lesion liver possible metastatic, minimal uptake one area t spine. Asymptomatic. Not a surgical candidate with CT findings.  Considdered not bulk and not visceral disease. Psa 13.6 prior to tx...casodex , then lupron first dose 12/31     Primary prostate cancer with metastasis from prostate to other site [facility])   06/04/2017 -  Chemotherapy    The patient had pegfilgrastim-cbqv (UDENYCA) injection 6 mg, 6 mg, Subcutaneous, Once, 2 of 5 cycles cabazitaxel (JEVTANA) 52 mg in dextrose 5 % 250 mL chemo infusion, 25 mg/m2 = 52 mg, Intravenous,  Once, 3 of 6 cycles Administration: 52 mg (07/09/2017), 52 mg (07/31/2017)  for chemotherapy treatment.        PAST MEDICAL HISTORY: Past Medical History:  Diagnosis Date  . Cancer of prostate ([place]) 07/13/2014  . Hypertension     PAST SURGICAL HISTORY: History reviewed. No pertinent surgical history.  FAMILY HISTORY: Reviewed and unchanged. No reported history of malignancy or chronic disease.    ADVANCED DIRECTIVES:    HEALTH MAINTENANCE: Social History   Tobacco Use  . Smoking status: Current Every Day Smoker  . Smokeless tobacco: Never Used  Substance Use Topics  . Alcohol use: Yes    Alcohol/week: 0.0 oz  . Drug use: No     No Known Allergies  Current Outpatient Medications  Medication Sig Dispense Refill  . allopurinol (ZYLOPRIM) 100 MG tablet Take 100 mg by mouth daily.     Marland Kitchen aspirin EC 81 MG tablet Take 81 mg by mouth daily.    . fluvastatin (LESCOL) 20 MG capsule Take 20 mg by mouth at bedtime.    . hydrochlorothiazide (HYDRODIURIL) 12.5 MG tablet Take 12.5 mg by mouth daily.   1  . HYDROcodone-acetaminophen (NORCO/VICODIN) 5-325 MG tablet Take 1 tablet by mouth every 6 (six) hours  as needed for moderate pain. 30 tablet 0  . Omega-3 Fatty Acids (FISH OIL) 1000 MG CAPS Take by mouth 4 (four) times daily.     . tamsulosin (FLOMAX) 0.4 MG CAPS capsule Take 0.4 mg by mouth 2 (two) times daily.     No current facility-administered medications for this visit.    Facility-Administered Medications Ordered in Other Visits  Medication Dose Route Frequency  Provider Last Rate Last Dose  . ondansetron (ZOFRAN) injection 8 mg  8 mg Intravenous Once Lloyd Huger, MD        OBJECTIVE: BP (!) 99/96 (BP Location: Left Arm, Patient Position: Sitting)   Pulse 90   Temp 97.6 F (36.4 C) (Tympanic)   Resp 16   Ht _0  (1.727 m)   Wt 183 lb 8 oz (83.2 kg)   BMI 27.90 kg/m    Body mass index is 27.9 kg/m.    ECOG FS:0 - Asymptomatic  General: Well-developed, well-nourished, no acute distress. Eyes: Pink conjunctiva, anicteric sclera. HEENT: Normocephalic, moist mucous membranes.. Lungs: Clear to auscultation bilaterally. Heart: Regular rate and rhythm. No rubs, murmurs, or gallops. Abdomen: Soft, nontender, nondistended. No organomegaly noted, normoactive bowel sounds. Musculoskeletal: No edema, cyanosis, or clubbing. Neuro: Alert, answering all questions appropriately. Cranial nerves grossly intact. Skin: No rashes or petechiae noted. Psych: Normal affect.  LAB RESULTS:  Appointment on 07/31/2017  Component Date Value Ref Range Status  . Sodium 07/31/2017 137  135 - 145 mmol/L Final  . Potassium 07/31/2017 3.1* 3.5 - 5.1 mmol/L Final  . Chloride 07/31/2017 103  98 - 111 mmol/L Final  . CO2 07/31/2017 21* 22 - 32 mmol/L Final  . Glucose, Bld 07/31/2017 116* 70 - 99 mg/dL Final  . BUN 07/31/2017 24* 8 - 23 mg/dL Final  . Creatinine, Ser 07/31/2017 1.30* 0.61 - 1.24 mg/dL Final  . Calcium 07/31/2017 8.2* 8.9 - 10.3 mg/dL Final  . Total Protein 07/31/2017 6.6  6.5 - 8.1 g/dL Final  . Albumin 07/31/2017 3.0* 3.5 - 5.0 g/dL Final  . AST 07/31/2017 22  15 - 41 U/L Final  . ALT 07/31/2017 13  0 - 44 U/L Final  . Alkaline Phosphatase 07/31/2017 62  38 - 126 U/L Final  . Total Bilirubin 07/31/2017 0.3  0.3 - 1.2 mg/dL Final  . GFR calc non Af Amer 07/31/2017 50* >60 mL/min Final  . GFR calc Af Amer 07/31/2017 58* >60 mL/min Final   Comment: (NOTE) The eGFR has been calculated using the CKD EPI equation. This calculation has not been  validated in all clinical situations. eGFR's persistently <60 mL/min signify possible Chronic Kidney Disease.   Georgiann Hahn gap 07/31/2017 13  5 - 15 Final   Performed at [facility], [place] [address] [place], [place] [address]  . WBC 07/31/2017 14.6* 3.8 - 10.6 K/uL Final  . RBC 07/31/2017 3.14* 4.40 - 5.90 MIL/uL Final  . Hemoglobin 07/31/2017 9.0* 13.0 - 18.0 g/dL Final  . HCT 07/31/2017 27.4* 40.0 - 52.0 % Final  . MCV 07/31/2017 87.2  80.0 - 100.0 fL Final  . MCH 07/31/2017 28.5  26.0 - 34.0 pg Final  . MCHC 07/31/2017 32.7  32.0 - 36.0 g/dL Final  . RDW 07/31/2017 16.5* 11.5 - 14.5 % Final  . Platelets 07/31/2017 385  150 - 440 K/uL Final  . Neutrophils Relative % 07/31/2017 81  % Final  . Neutro Abs 07/31/2017 11.9* 1.4 - 6.5 K/uL Final  . Lymphocytes Relative 07/31/2017 9  %  Final  . Lymphs Abs 07/31/2017 1.3  1.0 - 3.6 K/uL Final  . Monocytes Relative 07/31/2017 6  % Final  . Monocytes Absolute 07/31/2017 0.9  0.2 - 1.0 K/uL Final  . Eosinophils Relative 07/31/2017 3  % Final  . Eosinophils Absolute 07/31/2017 0.4  0 - 0.7 K/uL Final  . Basophils Relative 07/31/2017 1  % Final  . Basophils Absolute 07/31/2017 0.1  0 - 0.1 K/uL Final   Performed at [facility], [address], [place], [place] [address]  . Prostatic Specific Antigen 07/31/2017 27.78* 0.00 - 4.00 ng/mL Final   Comment: (NOTE) While PSA levels of <=4.0 ng/ml are reported as reference range, some men with levels below 4.0 ng/ml can have prostate cancer and many men with PSA above 4.0 ng/ml do not have prostate cancer.  Other tests such as free PSA, age specific reference ranges, PSA velocity and PSA doubling time may be helpful especially in men less than 75 years old. Performed at [facility], [place] [address], [place], [place] [address]      STUDIES: No results found.  ASSESSMENT:  Prostate cancer with retroperitoneal adenopathy.  PLAN:     1. Prostate cancer with retroperitoneal  adenopathy: CT scan and nuclear med bone scan from April 01, 2017 essentially revealed stable disease.  Patient's PSA continues to trend up and is now 27.78.  Proceed with treatment as planned today, but likely will have to discuss treatment goals in the near future.  Proceed with cycle 3 of cabazitaxel today. Will continue to hold Udenyca, but will consider adding it for subsequent cycles if patient becomes neutropenic.  Patient also receive Zometa today.  Return to clinic in 3 weeks for further evaluation.   2.  Hip pain: Resolved.  Patient is completed XRT. 3.  Anemia: Hemoglobin is trending down to 9.0.  Monitor.   4.  Diarrhea: Recommended over-the-counter Imodium as needed. 5.  Poor appetite: Patient was instructed to continue his Megace as ordered. 6.  Hypotension: Patient will receive 1 L IV fluids today. 7.  Hypokalemia: Patient will also receive 20 mEq IV potassium today.   8.  Chronic renal insufficiency: Patient's creatinine remains elevated, stable at 1.30.  IV fluids as above.  Patient expressed understanding and was in agreement with this plan. He also understands that He can call clinic at any time with any questions, concerns, or complaints.    Lloyd Huger, MD   04/26/2015 3:11 PM

## 2017-07-31 ENCOUNTER — Other Ambulatory Visit: Payer: Self-pay

## 2017-07-31 ENCOUNTER — Encounter: Payer: Self-pay | Admitting: Oncology

## 2017-07-31 ENCOUNTER — Inpatient Hospital Stay (HOSPITAL_BASED_OUTPATIENT_CLINIC_OR_DEPARTMENT_OTHER): Payer: Medicare Other | Admitting: Oncology

## 2017-07-31 ENCOUNTER — Inpatient Hospital Stay: Payer: Medicare Other

## 2017-07-31 VITALS — BP 99/96 | HR 90 | Temp 97.6°F | Resp 16 | Ht 68.0 in | Wt 183.5 lb

## 2017-07-31 DIAGNOSIS — R634 Abnormal weight loss: Secondary | ICD-10-CM

## 2017-07-31 DIAGNOSIS — D649 Anemia, unspecified: Secondary | ICD-10-CM | POA: Diagnosis not present

## 2017-07-31 DIAGNOSIS — I959 Hypotension, unspecified: Secondary | ICD-10-CM

## 2017-07-31 DIAGNOSIS — C61 Malignant neoplasm of prostate: Secondary | ICD-10-CM

## 2017-07-31 DIAGNOSIS — Z5111 Encounter for antineoplastic chemotherapy: Secondary | ICD-10-CM | POA: Diagnosis not present

## 2017-07-31 DIAGNOSIS — F17210 Nicotine dependence, cigarettes, uncomplicated: Secondary | ICD-10-CM

## 2017-07-31 DIAGNOSIS — R197 Diarrhea, unspecified: Secondary | ICD-10-CM | POA: Diagnosis not present

## 2017-07-31 DIAGNOSIS — Z923 Personal history of irradiation: Secondary | ICD-10-CM

## 2017-07-31 DIAGNOSIS — N189 Chronic kidney disease, unspecified: Secondary | ICD-10-CM

## 2017-07-31 DIAGNOSIS — E876 Hypokalemia: Secondary | ICD-10-CM

## 2017-07-31 DIAGNOSIS — Z79899 Other long term (current) drug therapy: Secondary | ICD-10-CM

## 2017-07-31 DIAGNOSIS — Z7982 Long term (current) use of aspirin: Secondary | ICD-10-CM

## 2017-07-31 DIAGNOSIS — R531 Weakness: Secondary | ICD-10-CM

## 2017-07-31 LAB — CBC WITH DIFFERENTIAL/PLATELET
BASOS ABS: 0.1 10*3/uL (ref 0–0.1)
Basophils Relative: 1 %
Eosinophils Absolute: 0.4 10*3/uL (ref 0–0.7)
Eosinophils Relative: 3 %
HCT: 27.4 % — ABNORMAL LOW (ref 40.0–52.0)
Hemoglobin: 9 g/dL — ABNORMAL LOW (ref 13.0–18.0)
Lymphocytes Relative: 9 %
Lymphs Abs: 1.3 10*3/uL (ref 1.0–3.6)
MCH: 28.5 pg (ref 26.0–34.0)
MCHC: 32.7 g/dL (ref 32.0–36.0)
MCV: 87.2 fL (ref 80.0–100.0)
Monocytes Absolute: 0.9 10*3/uL (ref 0.2–1.0)
Monocytes Relative: 6 %
Neutro Abs: 11.9 10*3/uL — ABNORMAL HIGH (ref 1.4–6.5)
Neutrophils Relative %: 81 %
Platelets: 385 10*3/uL (ref 150–440)
RBC: 3.14 MIL/uL — ABNORMAL LOW (ref 4.40–5.90)
RDW: 16.5 % — ABNORMAL HIGH (ref 11.5–14.5)
WBC: 14.6 10*3/uL — ABNORMAL HIGH (ref 3.8–10.6)

## 2017-07-31 LAB — COMPREHENSIVE METABOLIC PANEL
ALK PHOS: 62 U/L (ref 38–126)
ALT: 13 U/L (ref 0–44)
AST: 22 U/L (ref 15–41)
Albumin: 3 g/dL — ABNORMAL LOW (ref 3.5–5.0)
Anion gap: 13 (ref 5–15)
BILIRUBIN TOTAL: 0.3 mg/dL (ref 0.3–1.2)
BUN: 24 mg/dL — ABNORMAL HIGH (ref 8–23)
CO2: 21 mmol/L — ABNORMAL LOW (ref 22–32)
Calcium: 8.2 mg/dL — ABNORMAL LOW (ref 8.9–10.3)
Chloride: 103 mmol/L (ref 98–111)
Creatinine, Ser: 1.3 mg/dL — ABNORMAL HIGH (ref 0.61–1.24)
GFR calc non Af Amer: 50 mL/min — ABNORMAL LOW (ref >60)
GFR, EST AFRICAN AMERICAN: 58 mL/min — AB (ref >60)
Glucose, Bld: 116 mg/dL — ABNORMAL HIGH (ref 70–99)
Potassium: 3.1 mmol/L — ABNORMAL LOW (ref 3.5–5.1)
Sodium: 137 mmol/L (ref 135–145)
TOTAL PROTEIN: 6.6 g/dL (ref 6.5–8.1)

## 2017-07-31 LAB — PSA: Prostatic Specific Antigen: 27.78 ng/mL — ABNORMAL HIGH (ref 0.00–4.00)

## 2017-07-31 MED ORDER — SODIUM CHLORIDE 0.9 % IV SOLN
Freq: Once | INTRAVENOUS | Status: AC
  Administered 2017-07-31: 11:00:00 via INTRAVENOUS
  Filled 2017-07-31: qty 1000

## 2017-07-31 MED ORDER — SODIUM CHLORIDE 0.9 % IV SOLN
Freq: Once | INTRAVENOUS | Status: DC
  Filled 2017-07-31: qty 1000

## 2017-07-31 MED ORDER — SODIUM CHLORIDE 0.9 % IV SOLN
25.0000 mg/m2 | Freq: Once | INTRAVENOUS | Status: AC
  Administered 2017-07-31: 52 mg via INTRAVENOUS
  Filled 2017-07-31: qty 5.2

## 2017-07-31 MED ORDER — DEXAMETHASONE SODIUM PHOSPHATE 10 MG/ML IJ SOLN
10.0000 mg | Freq: Once | INTRAMUSCULAR | Status: AC
  Administered 2017-07-31: 10 mg via INTRAVENOUS
  Filled 2017-07-31: qty 1

## 2017-07-31 MED ORDER — ZOLEDRONIC ACID 4 MG/100ML IV SOLN
4.0000 mg | Freq: Once | INTRAVENOUS | Status: AC
  Administered 2017-07-31: 4 mg via INTRAVENOUS
  Filled 2017-07-31: qty 100

## 2017-07-31 MED ORDER — FAMOTIDINE IN NACL 20-0.9 MG/50ML-% IV SOLN
20.0000 mg | Freq: Once | INTRAVENOUS | Status: AC
  Administered 2017-07-31: 20 mg via INTRAVENOUS
  Filled 2017-07-31: qty 50

## 2017-07-31 MED ORDER — DIPHENHYDRAMINE HCL 50 MG/ML IJ SOLN
25.0000 mg | Freq: Once | INTRAMUSCULAR | Status: AC
  Administered 2017-07-31: 25 mg via INTRAVENOUS
  Filled 2017-07-31: qty 1

## 2017-07-31 MED ORDER — SODIUM CHLORIDE 0.9 % IV SOLN: 10.0000 mg | Freq: Once | INTRAVENOUS | Status: DC

## 2017-07-31 NOTE — Progress Notes (Signed)
Patient here for pretreatment check, he has had  an 8 pound wt loss, he is still having diarrhea.

## 2017-08-03 ENCOUNTER — Other Ambulatory Visit: Payer: Self-pay

## 2017-08-03 ENCOUNTER — Inpatient Hospital Stay
Admission: EM | Admit: 2017-08-03 | Discharge: 2017-08-14 | DRG: 682 | Disposition: A | Discharge status: Home Health | Payer: Medicare Other | Attending: Internal Medicine | Admitting: Internal Medicine

## 2017-08-03 DIAGNOSIS — R310 Gross hematuria: Secondary | ICD-10-CM | POA: Diagnosis present

## 2017-08-03 DIAGNOSIS — K56609 Unspecified intestinal obstruction, unspecified as to partial versus complete obstruction: Secondary | ICD-10-CM

## 2017-08-03 DIAGNOSIS — K567 Ileus, unspecified: Secondary | ICD-10-CM

## 2017-08-03 DIAGNOSIS — N390 Urinary tract infection, site not specified: Secondary | ICD-10-CM | POA: Diagnosis not present

## 2017-08-03 DIAGNOSIS — R112 Nausea with vomiting, unspecified: Secondary | ICD-10-CM

## 2017-08-03 DIAGNOSIS — Z923 Personal history of irradiation: Secondary | ICD-10-CM

## 2017-08-03 DIAGNOSIS — N1330 Unspecified hydronephrosis: Secondary | ICD-10-CM | POA: Diagnosis present

## 2017-08-03 DIAGNOSIS — Z7982 Long term (current) use of aspirin: Secondary | ICD-10-CM

## 2017-08-03 DIAGNOSIS — N179 Acute kidney failure, unspecified: Secondary | ICD-10-CM | POA: Diagnosis not present

## 2017-08-03 DIAGNOSIS — D709 Neutropenia, unspecified: Secondary | ICD-10-CM | POA: Diagnosis not present

## 2017-08-03 DIAGNOSIS — T451X5A Adverse effect of antineoplastic and immunosuppressive drugs, initial encounter: Secondary | ICD-10-CM

## 2017-08-03 DIAGNOSIS — N136 Pyonephrosis: Secondary | ICD-10-CM | POA: Diagnosis present

## 2017-08-03 DIAGNOSIS — Z79899 Other long term (current) drug therapy: Secondary | ICD-10-CM

## 2017-08-03 DIAGNOSIS — E440 Moderate protein-calorie malnutrition: Secondary | ICD-10-CM | POA: Diagnosis present

## 2017-08-03 DIAGNOSIS — D638 Anemia in other chronic diseases classified elsewhere: Secondary | ICD-10-CM | POA: Diagnosis present

## 2017-08-03 DIAGNOSIS — R5081 Fever presenting with conditions classified elsewhere: Secondary | ICD-10-CM | POA: Diagnosis not present

## 2017-08-03 DIAGNOSIS — C61 Malignant neoplasm of prostate: Secondary | ICD-10-CM | POA: Diagnosis present

## 2017-08-03 DIAGNOSIS — Z789 Other specified health status: Secondary | ICD-10-CM

## 2017-08-03 DIAGNOSIS — E861 Hypovolemia: Secondary | ICD-10-CM | POA: Diagnosis present

## 2017-08-03 DIAGNOSIS — E876 Hypokalemia: Secondary | ICD-10-CM | POA: Diagnosis present

## 2017-08-03 DIAGNOSIS — R55 Syncope and collapse: Secondary | ICD-10-CM | POA: Diagnosis present

## 2017-08-03 DIAGNOSIS — Z0189 Encounter for other specified special examinations: Secondary | ICD-10-CM

## 2017-08-03 DIAGNOSIS — I129 Hypertensive chronic kidney disease with stage 1 through stage 4 chronic kidney disease, or unspecified chronic kidney disease: Secondary | ICD-10-CM | POA: Diagnosis present

## 2017-08-03 DIAGNOSIS — A419 Sepsis, unspecified organism: Secondary | ICD-10-CM | POA: Diagnosis not present

## 2017-08-03 DIAGNOSIS — N189 Chronic kidney disease, unspecified: Secondary | ICD-10-CM

## 2017-08-03 DIAGNOSIS — E1122 Type 2 diabetes mellitus with diabetic chronic kidney disease: Secondary | ICD-10-CM | POA: Diagnosis present

## 2017-08-03 DIAGNOSIS — Z66 Do not resuscitate: Secondary | ICD-10-CM | POA: Diagnosis present

## 2017-08-03 DIAGNOSIS — Z9221 Personal history of antineoplastic chemotherapy: Secondary | ICD-10-CM

## 2017-08-03 DIAGNOSIS — E860 Dehydration: Secondary | ICD-10-CM | POA: Diagnosis present

## 2017-08-03 DIAGNOSIS — D62 Acute posthemorrhagic anemia: Secondary | ICD-10-CM | POA: Diagnosis present

## 2017-08-03 DIAGNOSIS — N329 Bladder disorder, unspecified: Secondary | ICD-10-CM | POA: Diagnosis present

## 2017-08-03 DIAGNOSIS — R509 Fever, unspecified: Secondary | ICD-10-CM

## 2017-08-03 DIAGNOSIS — I951 Orthostatic hypotension: Secondary | ICD-10-CM | POA: Diagnosis present

## 2017-08-03 DIAGNOSIS — N183 Chronic kidney disease, stage 3 (moderate): Secondary | ICD-10-CM | POA: Diagnosis present

## 2017-08-03 DIAGNOSIS — D61818 Other pancytopenia: Secondary | ICD-10-CM | POA: Diagnosis present

## 2017-08-03 DIAGNOSIS — R319 Hematuria, unspecified: Secondary | ICD-10-CM | POA: Diagnosis present

## 2017-08-03 DIAGNOSIS — K5651 Intestinal adhesions [bands], with partial obstruction: Secondary | ICD-10-CM | POA: Diagnosis present

## 2017-08-03 DIAGNOSIS — D701 Agranulocytosis secondary to cancer chemotherapy: Secondary | ICD-10-CM

## 2017-08-03 DIAGNOSIS — R339 Retention of urine, unspecified: Secondary | ICD-10-CM

## 2017-08-03 LAB — CBC
HCT: 25.5 % — ABNORMAL LOW (ref 40.0–52.0)
Hemoglobin: 8.5 g/dL — ABNORMAL LOW (ref 13.0–18.0)
MCH: 29.3 pg (ref 26.0–34.0)
MCHC: 33.3 g/dL (ref 32.0–36.0)
MCV: 87.8 fL (ref 80.0–100.0)
PLATELETS: 269 10*3/uL (ref 150–440)
RBC: 2.9 MIL/uL — AB (ref 4.40–5.90)
RDW: 17.8 % — AB (ref 11.5–14.5)
WBC: 11.8 10*3/uL — ABNORMAL HIGH (ref 3.8–10.6)

## 2017-08-03 LAB — BASIC METABOLIC PANEL
Anion gap: 13 (ref 5–15)
BUN: 30 mg/dL — AB (ref 8–23)
CALCIUM: 7.6 mg/dL — AB (ref 8.9–10.3)
CO2: 19 mmol/L — ABNORMAL LOW (ref 22–32)
CREATININE: 1.66 mg/dL — AB (ref 0.61–1.24)
Chloride: 105 mmol/L (ref 98–111)
GFR calc Af Amer: 43 mL/min — ABNORMAL LOW (ref >60)
GFR, EST NON AFRICAN AMERICAN: 37 mL/min — AB (ref >60)
GLUCOSE: 189 mg/dL — AB (ref 70–99)
POTASSIUM: 3.8 mmol/L (ref 3.5–5.1)
Sodium: 137 mmol/L (ref 135–145)

## 2017-08-03 LAB — HEPATIC FUNCTION PANEL
ALT: 11 U/L (ref 0–44)
AST: 19 U/L (ref 15–41)
Albumin: 3.1 g/dL — ABNORMAL LOW (ref 3.5–5.0)
Alkaline Phosphatase: 44 U/L (ref 38–126)
Bilirubin, Direct: <0.1 mg/dL (ref 0.0–0.2)
Total Bilirubin: 0.7 mg/dL (ref 0.3–1.2)
Total Protein: 6.5 g/dL (ref 6.5–8.1)

## 2017-08-03 LAB — TROPONIN I

## 2017-08-03 LAB — LIPASE, BLOOD: Lipase: 22 U/L (ref 11–51)

## 2017-08-03 MED ORDER — LIDOCAINE HCL URETHRAL/MUCOSAL 2 % EX GEL
CUTANEOUS | Status: AC
  Administered 2017-08-03: 1 via URETHRAL
  Filled 2017-08-03: qty 10

## 2017-08-03 MED ORDER — LIDOCAINE HCL URETHRAL/MUCOSAL 2 % EX GEL
1.0000 "application " | Freq: Once | CUTANEOUS | Status: AC
  Administered 2017-08-03: 1 via URETHRAL

## 2017-08-03 NOTE — ED Triage Notes (Addendum)
EMS pt to [id number] from home with report of blood in his urine and diarrhea for about 1 week. Pt is currently receiving chemo and radiation for prostrate cancer. Pt also had 2 syncopal episodes at home tonight.

## 2017-08-03 NOTE — ED Provider Notes (Signed)
[facility] Emergency Department Provider Note    None    (approximate)  I have reviewed the triage vital signs and the nursing notes.   HISTORY  Chief Complaint Diarrhea; Hematuria; and Loss of Consciousness    HPI Terry Wood. is a 84 y.o. male with below list of chronic medical conditions including metastatic prostate cancer presents to the emergency department with inability to urinate since this afternoon.  Patient also admits to hematuria with clots.  In addition patient's family states that the patient has had 3 syncopal episodes today 2 of which occurred with positional change however one did not occur with positional change.  Patient and family does admit to very poor p.o. intake patient denies any chest pain no shortness of breath no headache or dizziness at this time.  Patient does admit to suprapubic discomfort.  Patient denies any weakness numbness or visual changes.  Patient denies any change in speech.   Past Medical History:  Diagnosis Date  . Cancer of prostate ([name]) 07/13/2014  . Diabetes mellitus without complication ([place])   . Hypertension     Patient Active Problem List   Diagnosis Date Noted  . Acute kidney injury superimposed on chronic kidney disease ([name]) 08/04/2017  . Goals of care, counseling/discussion 12/03/2016  . Primary prostate cancer with metastasis from prostate to other site [facility]) 07/13/2014    History reviewed. No pertinent surgical history.  Prior to Admission medications   Medication Sig Start Date End Date Taking? Authorizing Provider  allopurinol (ZYLOPRIM) 100 MG tablet Take 100 mg by mouth daily.  06/16/17  Yes [provider]  aspirin EC 81 MG tablet Take 81 mg by mouth daily.   Yes [provider]  hydrochlorothiazide (HYDRODIURIL) 12.5 MG tablet Take 12.5 mg by mouth daily.  06/10/17  Yes [provider]  HYDROcodone-acetaminophen (NORCO/VICODIN) 5-325 MG tablet Take 1 tablet  by mouth every 6 (six) hours as needed for moderate pain. 06/19/17  Yes Lloyd Huger, MD  Omega-3 Fatty Acids (FISH OIL) 1000 MG CAPS Take 2,000 mg by mouth 2 (two) times daily.    Yes [provider]  tamsulosin (FLOMAX) 0.4 MG CAPS capsule Take 0.4 mg by mouth 2 (two) times daily.   Yes [provider]  prochlorperazine (COMPAZINE) 10 MG tablet Take 1 tablet (10 mg total) every 6 (six) hours as needed by mouth (Nausea or vomiting). 11/15/16 06/04/17  Lloyd Huger, MD    Allergies No known drug allergies History reviewed. No pertinent family history.  Social History Social History   Tobacco Use  . Smoking status: Current Every Day Smoker    Types: Cigarettes  . Smokeless tobacco: Never Used  Substance Use Topics  . Alcohol use: Not Currently    Alcohol/week: 0.0 oz  . Drug use: No    Review of Systems Constitutional: No fever/chills Eyes: No visual changes. ENT: No sore throat. Cardiovascular: Denies chest pain. Respiratory: Denies shortness of breath. Gastrointestinal: Positive for suprapubic discomfort no nausea, no vomiting.  No diarrhea.  No constipation. Genitourinary: Negative for dysuria. Musculoskeletal: Negative for neck pain.  Negative for back pain. Integumentary: Negative for rash. Neurological: Negative for headaches, focal weakness or numbness.  Positive for syncope     ____________________________________________   PHYSICAL EXAM:  VITAL SIGNS: ED Triage Vitals [08/03/17 2234]  Enc Vitals Group     BP      Pulse      Resp  Temp      Temp src      SpO2      Weight 83 kg (183 lb)     Height 1.727 m (5\' 8" )     Head Circumference      Peak Flow      Pain Score 5     Pain Loc      Pain Edu?      Excl. in [place]?     Constitutional: Alert and oriented.  Apparent discomfort  eyes: Conjunctivae are normal. PERRL. EOMI. Head: Atraumatic. Mouth/Throat: Mucous membranes are moist.  Oropharynx non-erythematous. Neck: No  stridor.   Cardiovascular: Normal rate, regular rhythm. Good peripheral circulation. Grossly normal heart sounds. Respiratory: Normal respiratory effort.  No retractions. Lungs CTAB. Gastrointestinal: Soft and nontender. No distention.  And with suprapubic palpation Musculoskeletal: No lower extremity tenderness nor edema. No gross deformities of extremities. Neurologic:  Normal speech and language. No gross focal neurologic deficits are appreciated.  Skin:  Skin is warm, dry and intact. No rash noted. Psychiatric: Mood and affect are normal. Speech and behavior are normal.  ____________________________________________   LABS (all labs ordered are listed, but only abnormal results are displayed)  Labs Reviewed  URINALYSIS, COMPLETE (UACMP) WITH MICROSCOPIC - Abnormal; Notable for the following components:      Result Value   Color, Urine RED (*)    APPearance CLOUDY (*)    Specific Gravity, Urine 1.033 (*)    Glucose, UA 150 (*)    Hgb urine dipstick MODERATE (*)    Protein, ur 100 (*)    RBC / HPF >50 (*)    All other components within normal limits  BASIC METABOLIC PANEL - Abnormal; Notable for the following components:   CO2 19 (*)    Glucose, Bld 189 (*)    BUN 30 (*)    Creatinine, Ser 1.66 (*)    Calcium 7.6 (*)    GFR calc non Af Amer 37 (*)    GFR calc Af Amer 43 (*)    All other components within normal limits  CBC - Abnormal; Notable for the following components:   WBC 11.8 (*)    RBC 2.90 (*)    Hemoglobin 8.5 (*)    HCT 25.5 (*)    RDW 17.8 (*)    All other components within normal limits  HEPATIC FUNCTION PANEL - Abnormal; Notable for the following components:   Albumin 3.1 (*)    All other components within normal limits  HEMOGLOBIN A1C - Abnormal; Notable for the following components:   Hgb A1c MFr Bld 6.7 (*)    All other components within normal limits  LIPASE, BLOOD  TROPONIN I  TSH  GLUCOSE, CAPILLARY  BASIC METABOLIC PANEL  CBC WITH  DIFFERENTIAL/PLATELET   ____________________________________________  EKG  ED ECG REPORT I, [place] N BROWN, the attending physician, personally viewed and interpreted this ECG.   Date: 08/04/2017  EKG Time: 10:38 PM  Rate: 110  Rhythm: Sinus tachycardia  Axis: Normal  Intervals: Normal  ST&T Change: None    Procedures   ____________________________________________   INITIAL IMPRESSION / ASSESSMENT AND PLAN / ED COURSE  As part of my medical decision making, I reviewed the following data within the electronic MEDICAL RECORD NUMBER   84 year old male presenting with above-stated history and physical exam secondary to urinary retention, hematuria and syncope.  Bedside bladder scan revealed proximally 500 cc of urine and as such three-way Foley catheter was placed.  Patient underwent  bladder irrigation with urine clearing at this time.  Regarding the patient's syncopal episode consider the possibility of vasovagal or dehydration being the etiology.  Patient has no focal neurological deficits at this time.  Patient received considerable relief after Foley catheter was placed however when patient was ambulated to the bedside commode following 2 L IV saline hydration patient still very unsteady on his feet and as such patient discussed with Dr. Marcille Blanco for hospital admission for further evaluation and management.    ____________________________________________  FINAL CLINICAL IMPRESSION(S) / ED DIAGNOSES  Urinary retention Hematuria Syncope  MEDICATIONS GIVEN DURING THIS VISIT:  Medications  acetaminophen (TYLENOL) tablet 650 mg (has no administration in time range)    Or  acetaminophen (TYLENOL) suppository 650 mg (has no administration in time range)  0.9 %  sodium chloride infusion ( Intravenous New Bag/Given 08/04/17 2026)  ondansetron (ZOFRAN) tablet 4 mg ( Oral See Alternative 08/04/17 0423)    Or  ondansetron (ZOFRAN) injection 4 mg (4 mg Intravenous Given 08/04/17 0423)    docusate sodium (COLACE) capsule 100 mg (100 mg Oral Not Given 08/04/17 2110)  nicotine (NICODERM CQ - dosed in mg/24 hours) patch 21 mg (21 mg Transdermal Patch Applied 08/04/17 0813)  HYDROcodone-acetaminophen (NORCO/VICODIN) 5-325 MG per tablet 1 tablet (1 tablet Oral Given 08/04/17 0606)  promethazine (PHENERGAN) injection 25 mg (25 mg Intravenous Given 08/04/17 0607)  loperamide (IMODIUM) capsule 2 mg (has no administration in time range)  insulin aspart (novoLOG) injection 0-9 Units (0 Units Subcutaneous Not Given 08/04/17 1723)  feeding supplement (ENSURE ENLIVE) (ENSURE ENLIVE) liquid 237 mL (237 mLs Oral Not Given 08/04/17 2111)  lidocaine (XYLOCAINE) 2 % jelly 1 application (1 application Urethral Given 08/03/17 2330)     ED Discharge Orders    None       Note:  This document was prepared using Dragon voice recognition software and may include unintentional dictation errors.    Gregor Hams, MD 08/04/17 2151

## 2017-08-04 DIAGNOSIS — R338 Other retention of urine: Secondary | ICD-10-CM | POA: Diagnosis not present

## 2017-08-04 DIAGNOSIS — Z923 Personal history of irradiation: Secondary | ICD-10-CM | POA: Diagnosis not present

## 2017-08-04 DIAGNOSIS — Z192 Hormone resistant malignancy status: Secondary | ICD-10-CM | POA: Diagnosis not present

## 2017-08-04 DIAGNOSIS — E1122 Type 2 diabetes mellitus with diabetic chronic kidney disease: Secondary | ICD-10-CM | POA: Diagnosis present

## 2017-08-04 DIAGNOSIS — E861 Hypovolemia: Secondary | ICD-10-CM | POA: Diagnosis present

## 2017-08-04 DIAGNOSIS — R55 Syncope and collapse: Secondary | ICD-10-CM | POA: Diagnosis present

## 2017-08-04 DIAGNOSIS — K56609 Unspecified intestinal obstruction, unspecified as to partial versus complete obstruction: Secondary | ICD-10-CM | POA: Diagnosis not present

## 2017-08-04 DIAGNOSIS — K5651 Intestinal adhesions [bands], with partial obstruction: Secondary | ICD-10-CM | POA: Diagnosis present

## 2017-08-04 DIAGNOSIS — Z9221 Personal history of antineoplastic chemotherapy: Secondary | ICD-10-CM | POA: Diagnosis not present

## 2017-08-04 DIAGNOSIS — A419 Sepsis, unspecified organism: Secondary | ICD-10-CM | POA: Diagnosis not present

## 2017-08-04 DIAGNOSIS — N189 Chronic kidney disease, unspecified: Secondary | ICD-10-CM

## 2017-08-04 DIAGNOSIS — N1330 Unspecified hydronephrosis: Secondary | ICD-10-CM | POA: Diagnosis not present

## 2017-08-04 DIAGNOSIS — I129 Hypertensive chronic kidney disease with stage 1 through stage 4 chronic kidney disease, or unspecified chronic kidney disease: Secondary | ICD-10-CM | POA: Diagnosis present

## 2017-08-04 DIAGNOSIS — E440 Moderate protein-calorie malnutrition: Secondary | ICD-10-CM | POA: Diagnosis present

## 2017-08-04 DIAGNOSIS — N183 Chronic kidney disease, stage 3 (moderate): Secondary | ICD-10-CM | POA: Diagnosis present

## 2017-08-04 DIAGNOSIS — N136 Pyonephrosis: Secondary | ICD-10-CM | POA: Diagnosis present

## 2017-08-04 DIAGNOSIS — R319 Hematuria, unspecified: Secondary | ICD-10-CM | POA: Diagnosis present

## 2017-08-04 DIAGNOSIS — N179 Acute kidney failure, unspecified: Secondary | ICD-10-CM | POA: Diagnosis present

## 2017-08-04 DIAGNOSIS — R310 Gross hematuria: Secondary | ICD-10-CM | POA: Diagnosis present

## 2017-08-04 DIAGNOSIS — D61818 Other pancytopenia: Secondary | ICD-10-CM | POA: Diagnosis present

## 2017-08-04 DIAGNOSIS — E860 Dehydration: Secondary | ICD-10-CM | POA: Diagnosis present

## 2017-08-04 DIAGNOSIS — R5081 Fever presenting with conditions classified elsewhere: Secondary | ICD-10-CM | POA: Diagnosis not present

## 2017-08-04 DIAGNOSIS — D709 Neutropenia, unspecified: Secondary | ICD-10-CM | POA: Diagnosis not present

## 2017-08-04 DIAGNOSIS — I951 Orthostatic hypotension: Secondary | ICD-10-CM | POA: Diagnosis present

## 2017-08-04 DIAGNOSIS — D62 Acute posthemorrhagic anemia: Secondary | ICD-10-CM | POA: Diagnosis present

## 2017-08-04 DIAGNOSIS — N390 Urinary tract infection, site not specified: Secondary | ICD-10-CM | POA: Diagnosis not present

## 2017-08-04 DIAGNOSIS — D638 Anemia in other chronic diseases classified elsewhere: Secondary | ICD-10-CM | POA: Diagnosis present

## 2017-08-04 DIAGNOSIS — C61 Malignant neoplasm of prostate: Secondary | ICD-10-CM | POA: Diagnosis not present

## 2017-08-04 DIAGNOSIS — R339 Retention of urine, unspecified: Secondary | ICD-10-CM | POA: Diagnosis not present

## 2017-08-04 LAB — URINALYSIS, COMPLETE (UACMP) WITH MICROSCOPIC
BACTERIA UA: NONE SEEN
BILIRUBIN URINE: NEGATIVE
Glucose, UA: 150 mg/dL — AB
KETONES UR: NEGATIVE mg/dL
LEUKOCYTES UA: NEGATIVE
NITRITE: NEGATIVE
PH: 7 (ref 5.0–8.0)
Protein, ur: 100 mg/dL — AB
RBC / HPF: >50 RBC/hpf — ABNORMAL HIGH (ref 0–5)
Specific Gravity, Urine: 1.033 — ABNORMAL HIGH (ref 1.005–1.030)
Squamous Epithelial / LPF: NONE SEEN (ref 0–5)

## 2017-08-04 LAB — HEMOGLOBIN A1C
Hgb A1c MFr Bld: 6.7 % — ABNORMAL HIGH (ref 4.8–5.6)
Mean Plasma Glucose: 145.59 mg/dL

## 2017-08-04 LAB — GLUCOSE, CAPILLARY
Glucose-Capillary: 96 mg/dL (ref 70–99)
Glucose-Capillary: 121 mg/dL — ABNORMAL HIGH (ref 70–99)

## 2017-08-04 LAB — TSH: TSH: 1.26 u[IU]/mL (ref 0.350–4.500)

## 2017-08-04 MED ORDER — LOPERAMIDE HCL 2 MG PO CAPS
2.0000 mg | ORAL_CAPSULE | Freq: Four times a day (QID) | ORAL | Status: DC | PRN
  Administered 2017-08-06 – 2017-08-07 (×3): 2 mg via ORAL
  Filled 2017-08-04 (×4): qty 1

## 2017-08-04 MED ORDER — ENSURE ENLIVE PO LIQD
237.0000 mL | Freq: Three times a day (TID) | ORAL | Status: DC
  Administered 2017-08-05 (×2): 237 mL via ORAL

## 2017-08-04 MED ORDER — ACETAMINOPHEN 325 MG PO TABS
650.0000 mg | ORAL_TABLET | Freq: Four times a day (QID) | ORAL | Status: DC | PRN
  Administered 2017-08-05 – 2017-08-08 (×5): 650 mg via ORAL
  Filled 2017-08-04 (×5): qty 2

## 2017-08-04 MED ORDER — SODIUM CHLORIDE 0.9 % IV SOLN
INTRAVENOUS | Status: DC
  Administered 2017-08-04 – 2017-08-09 (×10): via INTRAVENOUS

## 2017-08-04 MED ORDER — HYDROCODONE-ACETAMINOPHEN 5-325 MG PO TABS
1.0000 | ORAL_TABLET | ORAL | Status: DC | PRN
  Administered 2017-08-04: 1 via ORAL
  Filled 2017-08-04: qty 1

## 2017-08-04 MED ORDER — ONDANSETRON HCL 4 MG/2ML IJ SOLN
4.0000 mg | Freq: Four times a day (QID) | INTRAMUSCULAR | Status: DC | PRN
  Administered 2017-08-04 – 2017-08-08 (×2): 4 mg via INTRAVENOUS
  Filled 2017-08-04 (×2): qty 2

## 2017-08-04 MED ORDER — INSULIN ASPART 100 UNIT/ML ~~LOC~~ SOLN
0.0000 [IU] | Freq: Three times a day (TID) | SUBCUTANEOUS | Status: DC
  Administered 2017-08-05 – 2017-08-13 (×2): 1 [IU] via SUBCUTANEOUS
  Filled 2017-08-04 (×2): qty 1

## 2017-08-04 MED ORDER — DOCUSATE SODIUM 100 MG PO CAPS
100.0000 mg | ORAL_CAPSULE | Freq: Two times a day (BID) | ORAL | Status: DC
  Administered 2017-08-04: 100 mg via ORAL
  Filled 2017-08-04 (×3): qty 1

## 2017-08-04 MED ORDER — PROMETHAZINE HCL 25 MG/ML IJ SOLN
25.0000 mg | Freq: Three times a day (TID) | INTRAMUSCULAR | Status: DC | PRN
  Administered 2017-08-04 – 2017-08-09 (×3): 25 mg via INTRAVENOUS
  Filled 2017-08-04 (×3): qty 1

## 2017-08-04 MED ORDER — ACETAMINOPHEN 650 MG RE SUPP: 650.0000 mg | Freq: Four times a day (QID) | RECTAL | Status: DC | PRN

## 2017-08-04 MED ORDER — NICOTINE 21 MG/24HR TD PT24
21.0000 mg | MEDICATED_PATCH | Freq: Every day | TRANSDERMAL | Status: DC
  Administered 2017-08-04 – 2017-08-14 (×11): 21 mg via TRANSDERMAL
  Filled 2017-08-04 (×11): qty 1

## 2017-08-04 MED ORDER — ONDANSETRON HCL 4 MG PO TABS: 4.0000 mg | ORAL_TABLET | Freq: Four times a day (QID) | ORAL | Status: DC | PRN

## 2017-08-04 MED ORDER — ENOXAPARIN SODIUM 40 MG/0.4ML ~~LOC~~ SOLN: 40.0000 mg | SUBCUTANEOUS | Status: DC

## 2017-08-04 NOTE — Progress Notes (Addendum)
Inpatient Diabetes Program Recommendations  AACE/ADA: New Consensus Statement on Inpatient Glycemic Control (2015)  Target Ranges:  Prepandial:   less than 140 mg/dL      Peak postprandial:   less than 180 mg/dL (1-2 hours)      Critically ill patients:  140 - 180 mg/dL   Results for Terry Wood, Terry Wood (MRN [id number]) as of 08/04/2017 09:25  Ref. Range 08/03/2017 22:41  Hemoglobin A1C Latest Ref Range: 4.8 - 5.6 % 6.7 (H)    Review of Glycemic Control  Diabetes history: NO Outpatient Diabetes medications: NO Current orders for Inpatient glycemic control: NONE    MD- Note that patient had a Hemoglobin A1c level drawn.  Results were 6.7%.  Note that patient does NOT have a documented History of Diabetes.  Is this a new diagnosis?  Do we need to address with patient?      --Will follow patient during hospitalization--  Wyn Quaker RN, MSN, CDE Diabetes Coordinator Inpatient Glycemic Control Team Team Pager: [phone] (8a-5p)

## 2017-08-04 NOTE — Progress Notes (Signed)
Advance care planning  Purpose of Encounter Prostate cancer, syncope, CODE STATUS discussion  Parties in Attendance Patient and daughter at bedside  Patients Decisional capacity Patient is alert and oriented.  Able to make medical decisions  Discussed with patient regarding acute hematuria likely due to prostate cancer.  Presently on CBI. Follows with Dr. Grayland Ormond and Dr. Erlene Quan.  Patient has his healthcare power of attorney's designated.  His 2 sons Torri Langston and Milly Jakob are his healthcare power of attorney's.  Patient wishes to be DO NOT RESUSCITATE and not intubate. Orders entered for DO NOT RESUSCITATE/DO NOT INTUBATE  Time spent - 17 minutes

## 2017-08-04 NOTE — Progress Notes (Signed)
[facility] [place] NAME: Terry Wood    MR#:  [id number]  DATE OF BIRTH:  Oct 14, 1933  SUBJECTIVE:  CHIEF COMPLAINT:   Chief Complaint  Patient presents with  . Diarrhea  . Hematuria  . Loss of Consciousness   Patient on CBI.  Has had diarrhea which has resolved at this time. Afebrile  REVIEW OF SYSTEMS:    Review of Systems  Constitutional: Positive for malaise/fatigue. Negative for chills and fever.  HENT: Negative for sore throat.   Eyes: Negative for blurred vision, double vision and pain.  Respiratory: Negative for cough, hemoptysis, shortness of breath and wheezing.   Cardiovascular: Negative for chest pain, palpitations, orthopnea and leg swelling.  Gastrointestinal: Positive for abdominal pain and diarrhea. Negative for constipation, heartburn, nausea and vomiting.  Genitourinary: Positive for hematuria. Negative for dysuria.  Musculoskeletal: Negative for back pain and joint pain.  Skin: Negative for rash.  Neurological: Positive for dizziness. Negative for sensory change, speech change, focal weakness and headaches.  Endo/Heme/Allergies: Does not bruise/bleed easily.  Psychiatric/Behavioral: Negative for depression. The patient is not nervous/anxious.     DRUG ALLERGIES:  No Known Allergies  VITALS:  Blood pressure (!) 103/47, pulse (!) 110, temperature 98.3 F (36.8 C), temperature source Oral, resp. rate 15, height 5\' 8"  (1.727 m), weight 83.8 kg (184 lb 11.2 oz), SpO2 99 %.  PHYSICAL EXAMINATION:   Physical Exam  GENERAL:  84 y.o.-year-old patient lying in the bed with no acute distress.  EYES: Pupils equal, round, reactive to light and accommodation. No scleral icterus. Extraocular muscles intact.  HEENT: Head atraumatic, normocephalic. Oropharynx and nasopharynx clear.  NECK:  Supple, no jugular venous distention. No thyroid enlargement, no tenderness.  LUNGS: Normal breath sounds bilaterally, no wheezing,  rales, rhonchi. No use of accessory muscles of respiration.  CARDIOVASCULAR: S1, S2 normal. No murmurs, rubs, or gallops.  ABDOMEN: Soft, nontender, nondistended. Bowel sounds present. No organomegaly or mass.  Foley catheter in place EXTREMITIES: No cyanosis, clubbing or edema b/l.    NEUROLOGIC: Cranial nerves II through XII are intact. No focal Motor or sensory deficits b/l.   PSYCHIATRIC: The patient is alert and oriented x 3.  SKIN: No obvious rash, lesion, or ulcer.   LABORATORY PANEL:   CBC Recent Labs  Lab 08/03/17 2241  WBC 11.8*  HGB 8.5*  HCT 25.5*  PLT 269   ------------------------------------------------------------------------------------------------------------------ Chemistries  Recent Labs  Lab 08/03/17 2241  NA 137  K 3.8  CL 105  CO2 19*  GLUCOSE 189*  BUN 30*  CREATININE 1.66*  CALCIUM 7.6*  AST 19  ALT 11  ALKPHOS 44  BILITOT 0.7   ------------------------------------------------------------------------------------------------------------------  Cardiac Enzymes Recent Labs  Lab 08/03/17 2241  TROPONINI <0.03   ------------------------------------------------------------------------------------------------------------------  RADIOLOGY:  No results found.   ASSESSMENT AND PLAN:   *Hematuria likely due to prostate cancer.  On CBI.  Discussed with urology Dr. Diona Wood.  Consult placed. Monitor hemoglobin.  Discontinue Lovenox.  *Prostate cancer.  Status post chemotherapy and radiation.  Follows with Dr. Grayland Wood of oncology.  *Anemia of chronic disease.  Monitor as patient has hematuria.  Repeat labs.  *Acute kidney injury over CKD stage III.  Could be due to obstruction from hematuria.  Foley catheter has been placed and draining well.  Repeat labs in the morning.  Monitor input and output.  *Syncope.,  Orthostatic.  IV fluids.  Telemetry monitoring.  No need for echocardiogram.  All the records are  reviewed and case discussed with  Care Management/Social Workerr. Management plans discussed with the patient, family and they are in agreement.  CODE STATUS: DNR  DVT Prophylaxis: SCDs  TOTAL TIME TAKING CARE OF THIS PATIENT: 15 minutes.   POSSIBLE D/C IN 2-3 DAYS, DEPENDING ON CLINICAL CONDITION.  Terry Wood M.D on 08/04/2017 at 12:32 PM  Between 7am to 6pm - Pager - [phone]  After 6pm go to [email or web address] - password [email or web address] [facility]  Office  [phone]  CC: Primary care physician; System, Pcp Not In  Note: This dictation was prepared with Dragon dictation along with smaller phrase technology. Any transcriptional errors that result from this process are unintentional.

## 2017-08-04 NOTE — ED Notes (Signed)
Pt assisted up to [facility] by Judson Roch, EDT.

## 2017-08-04 NOTE — Progress Notes (Signed)
Initial Nutrition Assessment  DOCUMENTATION CODES:   Non-severe (moderate) malnutrition in context of chronic illness  INTERVENTION:  Recommend liberalizing diet to regular.  Provide Ensure Enlive po TID, each supplement provides 350 kcal and 20 grams of protein. Patient prefers vanilla or strawberry.  Provide Magic cup TID with meals, each supplement provides 290 kcal and 9 grams of protein. Patient prefers vanilla.  NUTRITION DIAGNOSIS:   Moderate Malnutrition related to chronic illness(prostate cancer) as evidenced by mild fat depletion, mild muscle depletion, moderate muscle depletion.  GOAL:   Patient will meet greater than or equal to 90% of their needs  MONITOR:   PO intake, Supplement acceptance, Labs, Weight trends, I & O's  REASON FOR ASSESSMENT:   Malnutrition Screening Tool    ASSESSMENT:   84 year old male with PMHx of HTN, DM, prostate cancer with retroperitoneal adenopathy on chemotherapy who is admitted with hematuria likely due to prostate cancer, anemia of chronic disease, AKI on CKD III, syncope.   Met with patient and his family members at bedside. Family reports he has had a poor appetite since last Thursday but was also not eating very well at baseline before that. They report he is only taking 1-2 spoons of soft foods such as mashed potatoes, soup, cottage cheese. While at home he drank a strawberry Ensure and liked it. Family reports he will likely enjoy vanilla Magic Cup and vanilla or strawberry Ensure Enlive.  UBW was 212-220 lbs. Patient was last around 220 lbs over a year ago. He was 202.2 lbs on 06/19/2017 and is currently 184.7 lbs. He has lost 17.5 lbs (8.7% body weight) over the past 1.5 months, which is significant for time frame.  Meal Completion: 0% per chart  Medications reviewed and include: Colace, Novolog 0-9 units TID, NS @ 75 mL/hr.  Labs reviewed: CO2 19, BUN 30, Creatinine 1.66.  NUTRITION - FOCUSED PHYSICAL EXAM:    Most Recent  Value  Orbital Region  Mild depletion  Upper Arm Region  Moderate depletion  Thoracic and Lumbar Region  Mild depletion  Buccal Region  Mild depletion  Temple Region  Moderate depletion  Clavicle Bone Region  Mild depletion  Clavicle and Acromion Bone Region  Mild depletion  Scapular Bone Region  Mild depletion  Dorsal Hand  Moderate depletion  Patellar Region  Mild depletion  Anterior Thigh Region  Mild depletion  Posterior Calf Region  Moderate depletion  Edema (RD Assessment)  None  Hair  Reviewed  Eyes  Reviewed  Mouth  Reviewed  Skin  Reviewed  Nails  Reviewed     Diet Order:   Diet Order           Diet Heart Room service appropriate? Yes; Fluid consistency: Thin  Diet effective now          EDUCATION NEEDS:   Education needs have been addressed  Skin:  Skin Assessment: Reviewed RN Assessment  Last BM:  08/04/2017  Height:   Ht Readings from Last 1 Encounters:  08/04/17 _0  (1.727 m)    Weight:   Wt Readings from Last 1 Encounters:  08/04/17 184 lb 11.2 oz (83.8 kg)    Ideal Body Weight:  70 kg  BMI:  Body mass index is 28.08 kg/m.  Estimated Nutritional Needs:   Kcal:  1980-2280 (MSJ x 1.3-1.5)  Protein:  100-115 grams (1.2-1.4 grams/kg)  Fluid:  2-2.2 L/day (1 mL/kcal)  Willey Blade, MS, RD, LDN Office: [phone] Pager: [phone] After Hours/Weekend Pager: [phone]

## 2017-08-04 NOTE — H&P (Signed)
Terry Wood. is an 84 y.o. male.   Chief Complaint: Fainting HPI: The patient with past medical history of prostate cancer and hypertension presents the emergency department after an episode of syncope.  The patient states that he became lightheaded and fell.  He did not hit his head or hurt himself.  He admits to having some diarrhea as well as hematuria.  The latter is relatively chronic.  Patient was found to be orthostatic in the emergency department and received multiple boluses of fluid but remained lightheaded mildly hypotensive which prompted the emergency department staff to call the hospitalist service for admission.  Past Medical History:  Diagnosis Date  . Cancer of prostate ([place]) 07/13/2014  . Hypertension     No past surgical history on file.  No family history on file. Social History:  reports that he has been smoking.  He has never used smokeless tobacco. He reports that he drinks alcohol. He reports that he does not use drugs.  Allergies: No Known Allergies  Medications Prior to Admission  Medication Sig Dispense Refill  . allopurinol (ZYLOPRIM) 100 MG tablet Take 100 mg by mouth daily.     Marland Kitchen aspirin EC 81 MG tablet Take 81 mg by mouth daily.    . hydrochlorothiazide (HYDRODIURIL) 12.5 MG tablet Take 12.5 mg by mouth daily.   1  . HYDROcodone-acetaminophen (NORCO/VICODIN) 5-325 MG tablet Take 1 tablet by mouth every 6 (six) hours as needed for moderate pain. 30 tablet 0  . Omega-3 Fatty Acids (FISH OIL) 1000 MG CAPS Take 2,000 mg by mouth 2 (two) times daily.     . tamsulosin (FLOMAX) 0.4 MG CAPS capsule Take 0.4 mg by mouth 2 (two) times daily.      Results for orders placed or performed during the hospital encounter of 08/03/17 (from the past 48 hour(s))  Basic metabolic panel     Status: Abnormal   Collection Time: 08/03/17 10:41 PM  Result Value Ref Range   Sodium 137 135 - 145 mmol/L   Potassium 3.8 3.5 - 5.1 mmol/L   Chloride 105 98 - 111 mmol/L   CO2 19  (L) 22 - 32 mmol/L   Glucose, Bld 189 (H) 70 - 99 mg/dL   BUN 30 (H) 8 - 23 mg/dL   Creatinine, Ser 1.66 (H) 0.61 - 1.24 mg/dL   Calcium 7.6 (L) 8.9 - 10.3 mg/dL   GFR calc non Af Amer 37 (L) >60 mL/min   GFR calc Af Amer 43 (L) >60 mL/min    Comment: (NOTE) The eGFR has been calculated using the CKD EPI equation. This calculation has not been validated in all clinical situations. eGFR's persistently <60 mL/min signify possible Chronic Kidney Disease.    Anion gap 13 5 - 15    Comment: Performed at [facility], [address] [place], [place] [address]  CBC     Status: Abnormal   Collection Time: 08/03/17 10:41 PM  Result Value Ref Range   WBC 11.8 (H) 3.8 - 10.6 K/uL   RBC 2.90 (L) 4.40 - 5.90 MIL/uL   Hemoglobin 8.5 (L) 13.0 - 18.0 g/dL   HCT 25.5 (L) 40.0 - 52.0 %   MCV 87.8 80.0 - 100.0 fL   MCH 29.3 26.0 - 34.0 pg   MCHC 33.3 32.0 - 36.0 g/dL   RDW 17.8 (H) 11.5 - 14.5 %   Platelets 269 150 - 440 K/uL    Comment: Performed at [facility], [address] [place], [place] [address]  Hepatic function panel     Status: Abnormal   Collection Time: 08/03/17 10:41 PM  Result Value Ref Range   Total Protein 6.5 6.5 - 8.1 g/dL   Albumin 3.1 (L) 3.5 - 5.0 g/dL   AST 19 15 - 41 U/L   ALT 11 0 - 44 U/L   Alkaline Phosphatase 44 38 - 126 U/L   Total Bilirubin 0.7 0.3 - 1.2 mg/dL   Bilirubin, Direct <0.1 0.0 - 0.2 mg/dL   Indirect Bilirubin NOT CALCULATED 0.3 - 0.9 mg/dL    Comment: Performed at [facility], [address] [place], [place] [address]  Lipase, blood     Status: None   Collection Time: 08/03/17 10:41 PM  Result Value Ref Range   Lipase 22 11 - 51 U/L    Comment: Performed at [facility], [address] [place], [place] [address]  Troponin I     Status: None   Collection Time: 08/03/17 10:41 PM  Result Value Ref Range   Troponin I <0.03 <0.03 ng/mL    Comment: Performed at [facility], [address] [place], [place] [address]  TSH     Status: None   Collection Time: 08/03/17 10:41 PM  Result Value Ref Range   TSH 1.260 0.350 - 4.500 uIU/mL    Comment: Performed by a 3rd Generation assay with a functional sensitivity of <=0.01 uIU/mL. Performed at [facility], [address] [place], [place] [address]   Urinalysis, Complete w Microscopic     Status: Abnormal   Collection Time: 08/04/17 12:40 AM  Result Value Ref Range   Color, Urine RED (A) YELLOW   APPearance CLOUDY (A) CLEAR   Specific Gravity, Urine 1.033 (H) 1.005 - 1.030   pH 7.0 5.0 - 8.0   Glucose, UA 150 (A) NEGATIVE mg/dL   Hgb urine dipstick MODERATE (A) NEGATIVE   Bilirubin Urine NEGATIVE NEGATIVE   Ketones, ur NEGATIVE NEGATIVE mg/dL   Protein, ur 100 (A) NEGATIVE mg/dL   Nitrite NEGATIVE NEGATIVE   Leukocytes, UA NEGATIVE NEGATIVE   RBC / HPF >50 (H) 0 - 5 RBC/hpf   WBC, UA 6-10 0 - 5 WBC/hpf   Bacteria, UA NONE SEEN NONE SEEN   Squamous Epithelial / LPF NONE SEEN 0 - 5    Comment: Performed at [facility], [address] [place],  [address]   No results found.  Review of Systems  Constitutional: Negative for chills and fever.  HENT: Negative for sore throat and tinnitus.   Eyes: Negative for blurred vision and redness.  Respiratory: Negative for cough and shortness of breath.   Cardiovascular: Negative for chest pain, palpitations, orthopnea and PND.  Gastrointestinal: Positive for nausea. Negative for abdominal pain, diarrhea and vomiting.  Genitourinary: Negative for dysuria, frequency and urgency.  Musculoskeletal: Negative for joint pain and myalgias.  Skin: Negative for rash.       No lesions  Neurological: Positive for loss of consciousness. Negative for speech change, focal weakness and weakness.  Endo/Heme/Allergies: Does not bruise/bleed easily.       No temperature intolerance  Psychiatric/Behavioral: Negative for depression and suicidal ideas.    Blood pressure  (!) 103/47, pulse (!) 110, temperature 98.3 F (36.8 C), temperature source Oral, resp. rate 15, height _0  (1.727 m), weight 83 kg (183 lb), SpO2 99 %. Physical Exam  Vitals reviewed. Constitutional: He is oriented to person, place, and time. He appears  well-developed and well-nourished. No distress.  HENT:  Head: Normocephalic and atraumatic.  Mouth/Throat: Oropharynx is clear and moist.  Eyes: Pupils are equal, round, and reactive to light. Conjunctivae and EOM are normal. No scleral icterus.  Neck: Normal range of motion. Neck supple. No JVD present. No tracheal deviation present. No thyromegaly present.  Cardiovascular: Normal rate, regular rhythm and normal heart sounds.  Respiratory: Effort normal and breath sounds normal. No respiratory distress.  GI: Soft. Bowel sounds are normal. He exhibits no distension. There is no tenderness.  Genitourinary:  Genitourinary Comments: Deferred  Musculoskeletal: Normal range of motion. He exhibits no edema.  Lymphadenopathy:    He has no cervical adenopathy.  Neurological: He is alert and oriented to person, place, and time. No cranial nerve deficit.  Skin: Skin is warm and dry. No rash noted. No erythema.  Psychiatric: He has a normal mood and affect. His behavior is normal. Judgment and thought content normal.     Assessment/Plan This is an 84 year old male admitted for acute on chronic kidney injury. 1.  Acute kidney injury: Superimposed on chronic kidney disease.  Secondary to dehydration.  Hydrate with intravenous fluid.  Avoid nephrotoxic agents. 2.  Syncope: Secondary to hypovolemia.  Obtain orthostatics once the patient is fully fluid resuscitated.  Consult cardiology if needed. 3.  Hypertension: Controlled; resume antihypertensive medication when the patient has achieved complete fluid resuscitation 4.  Prostate cancer: With hematuria.  Continue bladder irrigation until urine is cleared. 5.  DVT prophylaxis: Lovenox 6.  GI  prophylaxis: None The patient is a full code.  Time spent on admission orders and patient care approximately 45 minutes  Harrie Foreman, MD 08/04/2017, 5:04 AM

## 2017-08-05 DIAGNOSIS — E440 Moderate protein-calorie malnutrition: Secondary | ICD-10-CM

## 2017-08-05 LAB — BASIC METABOLIC PANEL
Anion gap: 13 (ref 5–15)
BUN: 34 mg/dL — ABNORMAL HIGH (ref 8–23)
CO2: 15 mmol/L — ABNORMAL LOW (ref 22–32)
Calcium: 6.7 mg/dL — ABNORMAL LOW (ref 8.9–10.3)
Chloride: 111 mmol/L (ref 98–111)
Creatinine, Ser: 1.68 mg/dL — ABNORMAL HIGH (ref 0.61–1.24)
GFR, EST AFRICAN AMERICAN: 42 mL/min — AB (ref >60)
GFR, EST NON AFRICAN AMERICAN: 37 mL/min — AB (ref >60)
Glucose, Bld: 119 mg/dL — ABNORMAL HIGH (ref 70–99)
POTASSIUM: 4.8 mmol/L (ref 3.5–5.1)
SODIUM: 139 mmol/L (ref 135–145)

## 2017-08-05 LAB — CBC WITH DIFFERENTIAL/PLATELET
BASOS ABS: 0.1 10*3/uL (ref 0–0.1)
BASOS PCT: 1 %
Eosinophils Absolute: 0 10*3/uL (ref 0–0.7)
Eosinophils Relative: 0 %
HEMATOCRIT: 23.4 % — AB (ref 40.0–52.0)
Hemoglobin: 7.5 g/dL — ABNORMAL LOW (ref 13.0–18.0)
LYMPHS PCT: 6 %
Lymphs Abs: 0.4 10*3/uL — ABNORMAL LOW (ref 1.0–3.6)
MCH: 29.9 pg (ref 26.0–34.0)
MCHC: 31.9 g/dL — ABNORMAL LOW (ref 32.0–36.0)
MCV: 93.6 fL (ref 80.0–100.0)
Monocytes Absolute: 0 10*3/uL — ABNORMAL LOW (ref 0.2–1.0)
Monocytes Relative: 0 %
Neutro Abs: 6.5 10*3/uL (ref 1.4–6.5)
Neutrophils Relative %: 93 %
PLATELETS: 170 10*3/uL (ref 150–440)
RBC: 2.5 MIL/uL — AB (ref 4.40–5.90)
RDW: 18.1 % — ABNORMAL HIGH (ref 11.5–14.5)
WBC: 7 10*3/uL (ref 3.8–10.6)

## 2017-08-05 LAB — LACTIC ACID, PLASMA: Lactic Acid, Venous: 0.8 mmol/L (ref 0.5–1.9)

## 2017-08-05 LAB — GLUCOSE, CAPILLARY
Glucose-Capillary: 109 mg/dL — ABNORMAL HIGH (ref 70–99)
Glucose-Capillary: 115 mg/dL — ABNORMAL HIGH (ref 70–99)
Glucose-Capillary: 136 mg/dL — ABNORMAL HIGH (ref 70–99)
Glucose-Capillary: 160 mg/dL — ABNORMAL HIGH (ref 70–99)

## 2017-08-05 MED ORDER — SODIUM CHLORIDE 0.9 % IR SOLN: 1000.0000 mL | Status: DC

## 2017-08-05 MED ORDER — SODIUM BICARBONATE 650 MG PO TABS
650.0000 mg | ORAL_TABLET | Freq: Three times a day (TID) | ORAL | Status: AC
Start: 2017-08-05 — End: 2017-08-05
  Administered 2017-08-05 (×3): 650 mg via ORAL
  Filled 2017-08-05 (×3): qty 1

## 2017-08-05 MED ORDER — ALTEPLASE 2 MG IJ SOLR
2.0000 mg | Freq: Once | INTRAMUSCULAR | Status: AC
  Administered 2017-08-05: 2 mg
  Filled 2017-08-05: qty 2

## 2017-08-05 MED ORDER — SODIUM CHLORIDE 0.9 % IV SOLN
1.0000 g | INTRAVENOUS | Status: DC
  Administered 2017-08-05 – 2017-08-06 (×2): 1 g via INTRAVENOUS
  Filled 2017-08-05 (×2): qty 1

## 2017-08-05 MED ORDER — SODIUM CHLORIDE 0.9 % IV BOLUS
500.0000 mL | Freq: Once | INTRAVENOUS | Status: AC
  Administered 2017-08-05: 10:00:00 500 mL via INTRAVENOUS

## 2017-08-05 MED ORDER — TAMSULOSIN HCL 0.4 MG PO CAPS
0.4000 mg | ORAL_CAPSULE | Freq: Every day | ORAL | Status: DC
  Administered 2017-08-05 – 2017-08-14 (×8): 0.4 mg via ORAL
  Filled 2017-08-05 (×9): qty 1

## 2017-08-05 MED ORDER — OMEGA-3-ACID ETHYL ESTERS 1 G PO CAPS
2.0000 g | ORAL_CAPSULE | Freq: Two times a day (BID) | ORAL | Status: DC
  Administered 2017-08-05 – 2017-08-13 (×12): 2 g via ORAL
  Filled 2017-08-05 (×16): qty 2

## 2017-08-05 MED ORDER — SODIUM CHLORIDE 0.9 % IR SOLN
3000.0000 mL | Status: DC
  Administered 2017-08-05 – 2017-08-07 (×8): 3000 mL

## 2017-08-05 NOTE — Progress Notes (Signed)
Dr Erlene Quan made aware that pt urinated 50cc dark red blood, bladder scan after void 420cc, pt assisted to [facility] and passed a dime sized clot, per Dr Erlene Quan she will come to insert foley soon

## 2017-08-05 NOTE — Consult Note (Signed)
Urology Consult  I have been asked to see the patient by Dr. Darvin Neighbours, for evaluation and management of gross hematuria.  Chief Complaint: same  History of Present Illness: Terry Wood. is a 84 y.o. year old with history of castrate resistant metastatic prostate cancer managed by Dr. Grayland Ormond who presented to the emergency room early yesterday morning with inability to void and gross hematuria.  In the emergency room, an 64 French three-way Foley catheter was placed with some difficulty per report by the ER nurse.  He was ultimately admitted on CBI with plans for urologic consultation.  He reports that prior to admission, he was straining to have bowel movement and had diarrhea.  At this point time, he developed gross hematuria with increasing difficulty voiding.  Bladder scan in the emergency room showed 500 cc in his bladder.  Notably, the patient was diagnosed in 2015 with metastatic stage IV prostate cancer.  He is undergone multiple treatments, most recently Zytiga Lupron and cabazitaxel.  He is also recently undergone palliative radiation right hip.  Most recent cross-sectional imaging in the form of CT abdomen pelvis with contrast on 03/2017 shows a large extending up from the prostate.  He also had marked hydroureteronephrosis down to this level at the time.  He has a 3.8 cm  right lower pole renal mass which has been slowly enlarging, from about 3 cm in 2015.  Since being admitted, he has had some issues with his hematuria, particularly overnight.  Had been here multiple times.  Per the patient's daughter, ?  TPA was used and the catheter but I do not see any documentation of this.  His hemoglobin is declined by 1 g since admission.  He does have a history of chronic anemia.   Past Medical History:  Diagnosis Date  . Cancer of prostate ([place]) 07/13/2014  . Diabetes mellitus without complication ([place])   . Hypertension     History reviewed. No pertinent surgical  history.  Home Medications:  Current Meds  Medication Sig  . allopurinol (ZYLOPRIM) 100 MG tablet Take 100 mg by mouth daily.   Marland Kitchen aspirin EC 81 MG tablet Take 81 mg by mouth daily.  . hydrochlorothiazide (HYDRODIURIL) 12.5 MG tablet Take 12.5 mg by mouth daily.   Marland Kitchen HYDROcodone-acetaminophen (NORCO/VICODIN) 5-325 MG tablet Take 1 tablet by mouth every 6 (six) hours as needed for moderate pain.  . Omega-3 Fatty Acids (FISH OIL) 1000 MG CAPS Take 2,000 mg by mouth 2 (two) times daily.   . tamsulosin (FLOMAX) 0.4 MG CAPS capsule Take 0.4 mg by mouth 2 (two) times daily.    Allergies: No Known Allergies  History reviewed. No pertinent family history.  Social History:  reports that he has been smoking cigarettes.  He has never used smokeless tobacco. He reports that he drank alcohol. He reports that he does not use drugs.  ROS: A complete review of systems was performed.  All systems are negative except for pertinent findings as noted.  Physical Exam:  Vital signs in last 24 hours: Temp:  [97.9 F (36.6 C)-102.9 F (39.4 C)] 99.2 F (37.3 C) (07/30 0600) Pulse Rate:  [73-113] 113 (07/30 0445) Resp:  [17-19] 19 (07/30 0445) BP: (105-117)/(50-65) 105/53 (07/30 0445) SpO2:  [94 %-95 %] 94 % (07/30 0445) Weight:  [183 lb 3.2 oz (83.1 kg)] 183 lb 3.2 oz (83.1 kg) (07/30 0500) Constitutional:  Alert and oriented, No acute distress. Daughter in law at bedside. Elderly-appearing. HEENT: [place]  AT, moist mucus membranes.  Trachea midline, no masses Cardiovascular: Regular rate and rhythm, no clubbing, cyanosis, or edema. Respiratory: Normal respiratory effort, no increased increased work of breathing. GI: Abdomen is soft, nontender, nondistended, no abdominal masses GU: Foley draining nearly clear urine on slow gtt Skin: Multiple skin abrasions with dressing present Neurologic: Grossly intact, no focal deficits, moving all 4 extremities Psychiatric: Normal mood and affect   Laboratory Data:   Recent Labs    08/03/17 2241 08/05/17 0341  WBC 11.8* 7.0  HGB 8.5* 7.5*  HCT 25.5* 23.4*   Recent Labs    08/03/17 2241 08/05/17 0341  NA 137 139  K 3.8 4.8  CL 105 111  CO2 19* 15*  GLUCOSE 189* 119*  BUN 30* 34*  CREATININE 1.66* 1.68*  CALCIUM 7.6* 6.7*    Radiologic Imaging: Most recent cross sectional imaging on 03/2017 reviewed  Large prostatic mass extending into bladder with obstruction of left UVJ, 3.8 cm right lower pole renal mass  Impression/Plan:  1. Prostate cancer Metastatic castrate resistant prostate cancer Managed at cancer center, Dr. Grayland Ormond Currently on cabazataxol  2. Gross hematuria/ urinary retention Small 18 Fr 3 way Foley on CBI with issues draining / irration overnight Urine appears fairly clear this AM --> will hold CBI x 1 hour then VT  If fails to void, will upsize to hematuria catheter in order to be able irrigate clot/ clear  Continue to hold anticoagulation as possible Trend CBC  3. Left hydronephrosis Asymptomatic left hydronephrosis since at least 3/22019 which does not appear to have been address Will reassess with RUS  4. Right renal mass-  3.8 cm right renal mass, slow interval growth Recommend continued observation   08/05/2017, 1:05 PM  Hollice Espy,  MD

## 2017-08-05 NOTE — Progress Notes (Signed)
Patient was seen and evaluated around 5 PM this evening after Foley catheter was removed earlier in the day and still unable to void.  Specifically, he was voiding small amounts of maroon-colored urine but nothing significant with a bladder scan greater than 400 cc.  Patient was prepped and draped in the standard sterile fashion.  First attempted to advance a 15 [place] hematuria catheter but due to a fossa navicularis stricture, was able to do so.  I used urethral dilators to gently dilate from 20 [place] to 24 [place] just within the fossa navicularis.  Ultimately, I was able to accommodate a 22 [place] hematuria 3-way catheter.  The bladder was irrigated with approximate 500 cc of sterile water with return of only scant amount of clot.  He was then started on a slow drip CBI overnight.  Hollice Espy, MD

## 2017-08-05 NOTE — Progress Notes (Addendum)
[facility] [place] NAME: Terry Wood    MR#:  [id number]  DATE OF BIRTH:  06/08/1933  SUBJECTIVE:  CHIEF COMPLAINT:   Chief Complaint  Patient presents with  . Diarrhea  . Hematuria  . Loss of Consciousness   Patient on CBI.  Pink urine.  Febrile 102.5 earlier today with sinus tachycardia.  Had blood clots causing obstruction of Foley catheter overnight which cleared with flushing.  REVIEW OF SYSTEMS:    Review of Systems  Constitutional: Positive for malaise/fatigue. Negative for chills and fever.  HENT: Negative for sore throat.   Eyes: Negative for blurred vision, double vision and pain.  Respiratory: Negative for cough, hemoptysis, shortness of breath and wheezing.   Cardiovascular: Negative for chest pain, palpitations, orthopnea and leg swelling.  Gastrointestinal: Positive for abdominal pain and diarrhea. Negative for constipation, heartburn, nausea and vomiting.  Genitourinary: Positive for hematuria. Negative for dysuria.  Musculoskeletal: Negative for back pain and joint pain.  Skin: Negative for rash.  Neurological: Positive for dizziness. Negative for sensory change, speech change, focal weakness and headaches.  Endo/Heme/Allergies: Does not bruise/bleed easily.  Psychiatric/Behavioral: Negative for depression. The patient is not nervous/anxious.     DRUG ALLERGIES:  No Known Allergies  VITALS:  Blood pressure (!) 105/53, pulse (!) 113, temperature 99.2 F (37.3 C), temperature source Oral, resp. rate 19, height 5\' 8"  (1.727 m), weight 83.1 kg (183 lb 3.2 oz), SpO2 94 %.  PHYSICAL EXAMINATION:   Physical Exam  GENERAL:  84 y.o.-year-old patient lying in the bed with no acute distress.  EYES: Pupils equal, round, reactive to light and accommodation. No scleral icterus. Extraocular muscles intact.  HEENT: Head atraumatic, normocephalic. Oropharynx and nasopharynx clear.  NECK:  Supple, no jugular venous  distention. No thyroid enlargement, no tenderness.  LUNGS: Normal breath sounds bilaterally, no wheezing, rales, rhonchi. No use of accessory muscles of respiration.  CARDIOVASCULAR: S1, S2 normal. No murmurs, rubs, or gallops.  ABDOMEN: Soft, nontender, nondistended. Bowel sounds present. No organomegaly or mass.  Foley catheter in place EXTREMITIES: No cyanosis, clubbing or edema b/l.    NEUROLOGIC: Cranial nerves II through XII are intact. No focal Motor or sensory deficits b/l.   PSYCHIATRIC: The patient is alert and oriented x 3.  SKIN: No obvious rash, lesion, or ulcer.   LABORATORY PANEL:   CBC Recent Labs  Lab 08/05/17 0341  WBC 7.0  HGB 7.5*  HCT 23.4*  PLT 170   ------------------------------------------------------------------------------------------------------------------ Chemistries  Recent Labs  Lab 08/03/17 2241 08/05/17 0341  NA 137 139  K 3.8 4.8  CL 105 111  CO2 19* 15*  GLUCOSE 189* 119*  BUN 30* 34*  CREATININE 1.66* 1.68*  CALCIUM 7.6* 6.7*  AST 19  --   ALT 11  --   ALKPHOS 44  --   BILITOT 0.7  --    ------------------------------------------------------------------------------------------------------------------  Cardiac Enzymes Recent Labs  Lab 08/03/17 2241  TROPONINI <0.03   ------------------------------------------------------------------------------------------------------------------  RADIOLOGY:  No results found.   ASSESSMENT AND PLAN:   *Sepsis secondary to UTI.  Tachycardic and febrile.  We will bolus normal saline stat.  Check lactic acid.  Start ceftriaxone. Urine culture is added to urine sample.  Blood cultures to be drawn today.  *Hematuria likely due to prostate cancer.  On CBI.  Discussed with urology Dr. Diona Fanti yesterday.  Waiting for input Monitor hemoglobin.  Discontinue Lovenox.  *Acute blood loss anemia over anemia of chronic disease.  No need for transfusion at this time.  Transfuse to keep hemoglobin  greater than 7.  *Prostate cancer.  Status post chemotherapy and radiation.  Follows with Dr. Grayland Ormond of oncology.  *Acute kidney injury over CKD stage III.  Could be due to obstruction from hematuria.  Foley catheter has been placed and draining well.  Monitor input and output.  Repeat labs in the morning.  *Syncope.,  Orthostatic.  IV fluids.  Telemetry monitoring with no arrhythmias.  No need for echocardiogram.  All the records are reviewed and case discussed with Care Management/Social Workerr. Management plans discussed with the patient, family and they are in agreement.  CODE STATUS: DNR  DVT Prophylaxis: SCDs  TOTAL CRITICAL CARE TIME TAKING CARE OF THIS PATIENT: 35 minutes.   POSSIBLE D/C IN 2-3 DAYS, DEPENDING ON CLINICAL CONDITION.  Terry Wood M.D on 08/05/2017 at 9:16 AM  Between 7am to 6pm - Pager - [phone]  After 6pm go to [email or web address] - password [email or web address] [facility]  Office  [phone]  CC: Primary care physician; System, Pcp Not In  Note: This dictation was prepared with Dragon dictation along with smaller phrase technology. Any transcriptional errors that result from this process are unintentional.

## 2017-08-05 NOTE — Progress Notes (Signed)
Dr Erlene Quan made aware that pt foley bag and tubing has dark red blood, new order to discontinue catheter if pt can not void or has clots urology will reinsert if needed

## 2017-08-05 NOTE — Progress Notes (Signed)
DR Erlene Quan stopped CBI states to wait 1 hour to assess color of output and call her with result

## 2017-08-06 ENCOUNTER — Inpatient Hospital Stay: Payer: Medicare Other

## 2017-08-06 ENCOUNTER — Other Ambulatory Visit: Payer: Self-pay | Admitting: Hematology and Oncology

## 2017-08-06 DIAGNOSIS — R55 Syncope and collapse: Secondary | ICD-10-CM

## 2017-08-06 DIAGNOSIS — F17210 Nicotine dependence, cigarettes, uncomplicated: Secondary | ICD-10-CM

## 2017-08-06 DIAGNOSIS — D649 Anemia, unspecified: Secondary | ICD-10-CM

## 2017-08-06 DIAGNOSIS — C61 Malignant neoplasm of prostate: Secondary | ICD-10-CM

## 2017-08-06 DIAGNOSIS — D709 Neutropenia, unspecified: Secondary | ICD-10-CM

## 2017-08-06 DIAGNOSIS — R197 Diarrhea, unspecified: Secondary | ICD-10-CM

## 2017-08-06 DIAGNOSIS — R319 Hematuria, unspecified: Secondary | ICD-10-CM

## 2017-08-06 DIAGNOSIS — R5081 Fever presenting with conditions classified elsewhere: Secondary | ICD-10-CM

## 2017-08-06 DIAGNOSIS — Z923 Personal history of irradiation: Secondary | ICD-10-CM

## 2017-08-06 DIAGNOSIS — Z66 Do not resuscitate: Secondary | ICD-10-CM

## 2017-08-06 DIAGNOSIS — N179 Acute kidney failure, unspecified: Principal | ICD-10-CM

## 2017-08-06 DIAGNOSIS — E861 Hypovolemia: Secondary | ICD-10-CM

## 2017-08-06 LAB — CBC
HCT: 15.8 % — ABNORMAL LOW (ref 40.0–52.0)
HEMOGLOBIN: 5.3 g/dL — AB (ref 13.0–18.0)
MCH: 29.6 pg (ref 26.0–34.0)
MCHC: 33.4 g/dL (ref 32.0–36.0)
MCV: 88.8 fL (ref 80.0–100.0)
Platelets: 136 10*3/uL — ABNORMAL LOW (ref 150–440)
RBC: 1.78 MIL/uL — AB (ref 4.40–5.90)
RDW: 17.3 % — ABNORMAL HIGH (ref 11.5–14.5)
WBC: 0.8 10*3/uL — CL (ref 3.8–10.6)

## 2017-08-06 LAB — BASIC METABOLIC PANEL
Anion gap: 9 (ref 5–15)
BUN: 37 mg/dL — AB (ref 8–23)
CALCIUM: 6.5 mg/dL — AB (ref 8.9–10.3)
CO2: 19 mmol/L — AB (ref 22–32)
CREATININE: 1.53 mg/dL — AB (ref 0.61–1.24)
Chloride: 111 mmol/L (ref 98–111)
GFR calc Af Amer: 47 mL/min — ABNORMAL LOW (ref >60)
GFR, EST NON AFRICAN AMERICAN: 41 mL/min — AB (ref >60)
Glucose, Bld: 142 mg/dL — ABNORMAL HIGH (ref 70–99)
Potassium: 3 mmol/L — ABNORMAL LOW (ref 3.5–5.1)
Sodium: 139 mmol/L (ref 135–145)

## 2017-08-06 LAB — HEMOGLOBIN AND HEMATOCRIT, BLOOD
HEMATOCRIT: 24.1 % — AB (ref 40.0–52.0)
HEMOGLOBIN: 8.1 g/dL — AB (ref 13.0–18.0)

## 2017-08-06 LAB — CBC WITH DIFFERENTIAL/PLATELET
BASOS PCT: 1 %
Basophils Absolute: 0 10*3/uL (ref 0–0.1)
Eosinophils Absolute: 0 10*3/uL (ref 0–0.7)
Eosinophils Relative: 1 %
HEMATOCRIT: 15.8 % — AB (ref 40.0–52.0)
HEMOGLOBIN: 5.3 g/dL — AB (ref 13.0–18.0)
LYMPHS ABS: 0.2 10*3/uL — AB (ref 1.0–3.6)
Lymphocytes Relative: 23 %
MCH: 29.6 pg (ref 26.0–34.0)
MCHC: 33.6 g/dL (ref 32.0–36.0)
MCV: 88 fL (ref 80.0–100.0)
MONOS PCT: 3 %
Monocytes Absolute: 0 10*3/uL — ABNORMAL LOW (ref 0.2–1.0)
NEUTROS PCT: 74 %
Neutro Abs: 0.7 10*3/uL — ABNORMAL LOW (ref 1.4–6.5)
Platelets: 146 10*3/uL — ABNORMAL LOW (ref 150–440)
RBC: 1.79 MIL/uL — ABNORMAL LOW (ref 4.40–5.90)
RDW: 17.1 % — ABNORMAL HIGH (ref 11.5–14.5)
WBC: 1 10*3/uL — CL (ref 3.8–10.6)

## 2017-08-06 LAB — GLUCOSE, CAPILLARY
Glucose-Capillary: 111 mg/dL — ABNORMAL HIGH (ref 70–99)
Glucose-Capillary: 115 mg/dL — ABNORMAL HIGH (ref 70–99)
Glucose-Capillary: 107 mg/dL — ABNORMAL HIGH (ref 70–99)
Glucose-Capillary: 95 mg/dL (ref 70–99)

## 2017-08-06 LAB — URINE CULTURE: Culture: NO GROWTH

## 2017-08-06 LAB — MRSA PCR SCREENING: MRSA by PCR: NEGATIVE

## 2017-08-06 LAB — ABO/RH: ABO/RH(D): A POS

## 2017-08-06 LAB — MAGNESIUM: Magnesium: 1.5 mg/dL — ABNORMAL LOW (ref 1.7–2.4)

## 2017-08-06 MED ORDER — DIPHENOXYLATE-ATROPINE 2.5-0.025 MG PO TABS
1.0000 | ORAL_TABLET | Freq: Two times a day (BID) | ORAL | Status: DC
  Administered 2017-08-06 – 2017-08-07 (×3): 1 via ORAL
  Filled 2017-08-06 (×3): qty 1

## 2017-08-06 MED ORDER — SODIUM CHLORIDE 0.9% IV SOLUTION
Freq: Once | INTRAVENOUS | Status: AC
  Administered 2017-08-06: 10:00:00 via INTRAVENOUS

## 2017-08-06 MED ORDER — POTASSIUM CHLORIDE CRYS ER 20 MEQ PO TBCR
40.0000 meq | EXTENDED_RELEASE_TABLET | Freq: Every day | ORAL | Status: AC
  Administered 2017-08-06 – 2017-08-07 (×2): 40 meq via ORAL
  Filled 2017-08-06 (×2): qty 2

## 2017-08-06 MED ORDER — TBO-FILGRASTIM 480 MCG/0.8ML ~~LOC~~ SOSY
480.0000 ug | PREFILLED_SYRINGE | Freq: Every day | SUBCUTANEOUS | Status: DC
  Administered 2017-08-06 – 2017-08-09 (×4): 480 ug via SUBCUTANEOUS
  Filled 2017-08-06 (×7): qty 0.8

## 2017-08-06 MED ORDER — SODIUM CHLORIDE 0.9 % IV SOLN
2.0000 g | Freq: Two times a day (BID) | INTRAVENOUS | Status: DC
  Administered 2017-08-06 – 2017-08-12 (×12): 2 g via INTRAVENOUS
  Filled 2017-08-06 (×13): qty 2

## 2017-08-06 NOTE — Progress Notes (Signed)
Pt alert and oriented; continues on the CBI with red output, Hbg 5.3 this am, irrigated foley times two with multiple blood clots. Continue to monitor

## 2017-08-06 NOTE — Progress Notes (Signed)
Urology Consult Follow Up  Subjective: Feeling fatigued this AM.  CBI overnight with some irrigation by nursing, urine clearing this AM.  Hbg and WBC down significantly this AM with plans for transfusion x 2.    Anti-infectives: Anti-infectives (From admission, onward)   Start     Dose/Rate Route Frequency Ordered Stop   08/05/17 0700  cefTRIAXone (ROCEPHIN) 1 g in sodium chloride 0.9 % 100 mL IVPB     1 g 200 mL/hr over 30 Minutes Intravenous Every 24 hours 08/05/17 0647        Current Facility-Administered Medications  Medication Dose Route Frequency Provider Last Rate Last Dose  . 0.9 %  sodium chloride infusion   Intravenous Continuous Hillary Bow, MD 75 mL/hr at 08/06/17 0042    . acetaminophen (TYLENOL) tablet 650 mg  650 mg Oral Q6H PRN Harrie Foreman, MD   650 mg at 08/06/17 4010   Or  . acetaminophen (TYLENOL) suppository 650 mg  650 mg Rectal Q6H PRN Harrie Foreman, MD      . cefTRIAXone (ROCEPHIN) 1 g in sodium chloride 0.9 % 100 mL IVPB  1 g Intravenous Q24H Hillary Bow, MD   Stopped at 08/06/17 [phone]  . docusate sodium (COLACE) capsule 100 mg  100 mg Oral BID Harrie Foreman, MD   100 mg at 08/04/17 0813  . feeding supplement (ENSURE ENLIVE) (ENSURE ENLIVE) liquid 237 mL  237 mL Oral TID BM Hillary Bow, MD   237 mL at 08/05/17 2001  . HYDROcodone-acetaminophen (NORCO/VICODIN) 5-325 MG per tablet 1 tablet  1 tablet Oral Q4H PRN Harrie Foreman, MD   1 tablet at 08/04/17 0606  . insulin aspart (novoLOG) injection 0-9 Units  0-9 Units Subcutaneous TID WC Hillary Bow, MD   1 Units at 08/05/17 1655  . loperamide (IMODIUM) capsule 2 mg  2 mg Oral Q6H PRN Hillary Bow, MD   2 mg at 08/06/17 0453  . nicotine (NICODERM CQ - dosed in mg/24 hours) patch 21 mg  21 mg Transdermal Daily Harrie Foreman, MD   21 mg at 08/06/17 3664  . omega-3 acid ethyl esters (LOVAZA) capsule 2 g  2 g Oral BID Hillary Bow, MD   2 g at 08/06/17 0752  . ondansetron (ZOFRAN)  tablet 4 mg  4 mg Oral Q6H PRN Harrie Foreman, MD       Or  . ondansetron [facility]) injection 4 mg  4 mg Intravenous Q6H PRN Harrie Foreman, MD   4 mg at 08/04/17 0423  . potassium chloride SA (K-DUR,KLOR-CON) CR tablet 40 mEq  40 mEq Oral Daily Harrie Foreman, MD   40 mEq at 08/06/17 0752  . promethazine (PHENERGAN) injection 25 mg  25 mg Intravenous Q8H PRN Harrie Foreman, MD   25 mg at 08/04/17 4034  . sodium chloride irrigation 0.9 % 3,000 mL  3,000 mL Irrigation Continuous Hillary Bow, MD   3,000 mL at 08/06/17 7425  . tamsulosin (FLOMAX) capsule 0.4 mg  0.4 mg Oral Daily Hillary Bow, MD   0.4 mg at 08/06/17 9563   Facility-Administered Medications Ordered in Other Encounters  Medication Dose Route Frequency Provider Last Rate Last Dose  . ondansetron (ZOFRAN) injection 8 mg  8 mg Intravenous Once Lloyd Huger, MD         Objective: Vital signs in last 24 hours: Temp:  [99.2 F (37.3 C)-100.4 F (38 C)] 100 F (37.8 C) (07/31 1000) Pulse Rate:  [93-100]  94 (07/31 1000) Resp:  [17-22] 18 (07/31 1000) BP: (86-140)/(47-67) 86/52 (07/31 1000) SpO2:  [96 %-99 %] 96 % (07/31 1000) Weight:  [186 lb 15.2 oz (84.8 kg)] 186 lb 15.2 oz (84.8 kg) (07/31 0458)  Intake/Output from previous day: 07/30 0701 - 07/31 0700 In: 28871.3 [I.V.:1871.3] Out: 37020 [Urine:37020] Intake/Output this shift: Total I/O In: 0  Out: 1300 [Urine:1300]   Physical Exam  Alert and oriented Abdomen soft nondistended nontender Hematuria catheter in place draining clear urine on moderately slow drip  Lab Results:  Recent Labs    08/06/17 0415 08/06/17 0844  WBC 1.0* 0.8*  HGB 5.3* 5.3*  HCT 15.8* 15.8*  PLT 146* 136*   BMET Recent Labs    08/05/17 0341 08/06/17 0415  NA 139 139  K 4.8 3.0*  CL 111 111  CO2 15* 19*  GLUCOSE 119* 142*  BUN 34* 37*  CREATININE 1.68* 1.53*  CALCIUM 6.7* 6.5*     Assessment: 84 year old male with gross hematuria secondary to  metastatic castrate resistant prostate cancer hematuria appears to be improving today on very slow drip CBI.   Plan: -Transfuse as needed -Continue to hold anticoagulation -Renal ultrasound pending -Titrate CBI to light pink  Urology will continue to follow closely    LOS: 2 days    Hollice Espy 08/06/2017

## 2017-08-06 NOTE — Progress Notes (Signed)
[facility] [place] NAME: Terry Wood    MR#:  [id number]  DATE OF BIRTH:  Jun 13, 1933  SUBJECTIVE:  CHIEF COMPLAINT:   Chief Complaint  Patient presents with  . Diarrhea  . Hematuria  . Loss of Consciousness   - significant drop in hb and also wbc today - hematuria improving, on continuous bladder irrigation  REVIEW OF SYSTEMS:  Review of Systems  Constitutional: Positive for malaise/fatigue. Negative for chills and fever.  HENT: Negative for congestion, ear discharge, hearing loss and nosebleeds.   Eyes: Negative for blurred vision and double vision.  Respiratory: Negative for cough, shortness of breath and wheezing.   Cardiovascular: Negative for chest pain and palpitations.  Gastrointestinal: Negative for abdominal pain, constipation, diarrhea, nausea and vomiting.  Genitourinary: Positive for hematuria. Negative for dysuria.  Musculoskeletal: Positive for myalgias.  Neurological: Negative for dizziness, focal weakness, seizures, weakness and headaches.  Psychiatric/Behavioral: Negative for depression.    DRUG ALLERGIES:  No Known Allergies  VITALS:  Blood pressure (!) 94/53, pulse 74, temperature 98.6 F (37 C), temperature source Oral, resp. rate 18, height 5\' 8"  (1.727 m), weight 84.8 kg (186 lb 15.2 oz), SpO2 97 %.  PHYSICAL EXAMINATION:  Physical Exam   GENERAL:  84 y.o.-year-old elderly patient lying in the bed with no acute distress.  EYES: Pupils equal, round, reactive to light and accommodation. No scleral icterus. Extraocular muscles intact.  HEENT: Head atraumatic, normocephalic. Oropharynx and nasopharynx clear.  NECK:  Supple, no jugular venous distention. No thyroid enlargement, no tenderness.  LUNGS: Normal breath sounds bilaterally, no wheezing, rales,rhonchi or crepitation. No use of accessory muscles of respiration.  Decreased bibasilar breath sounds CARDIOVASCULAR: S1, S2 normal. No murmurs, rubs, or  gallops.  ABDOMEN: Soft, nontender, minimal discomfort on palpation, nondistended. Bowel sounds present. No organomegaly or mass.  EXTREMITIES: No pedal edema, cyanosis, or clubbing.  NEUROLOGIC: Cranial nerves II through XII are intact. Muscle strength 5/5 in all extremities. Sensation intact. Gait not checked.  PSYCHIATRIC: The patient is alert and oriented x 3.  SKIN: No obvious rash, lesion, or ulcer.    LABORATORY PANEL:   CBC Recent Labs  Lab 08/06/17 0844  WBC 0.8*  HGB 5.3*  HCT 15.8*  PLT 136*   ------------------------------------------------------------------------------------------------------------------  Chemistries  Recent Labs  Lab 08/03/17 2241  08/06/17 0415  NA 137   < > 139  K 3.8   < > 3.0*  CL 105   < > 111  CO2 19*   < > 19*  GLUCOSE 189*   < > 142*  BUN 30*   < > 37*  CREATININE 1.66*   < > 1.53*  CALCIUM 7.6*   < > 6.5*  MG  --   --  1.5*  AST 19  --   --   ALT 11  --   --   ALKPHOS 44  --   --   BILITOT 0.7  --   --    < > = values in this interval not displayed.   ------------------------------------------------------------------------------------------------------------------  Cardiac Enzymes Recent Labs  Lab 08/03/17 2241  TROPONINI <0.03   ------------------------------------------------------------------------------------------------------------------  RADIOLOGY:  No results found.  EKG:   Orders placed or performed in visit on 08/03/17  . EKG 12-Lead  . EKG 12-Lead  . EKG 12-Lead    ASSESSMENT AND PLAN:   84 year old male with past medical history significant for metastatic prostate cancer, hypertension and diabetes presents to hospital  secondary to gross hematuria  1.  Hematuria-also with urinary retention. -Appreciate urology consult. -Hold any anticoagulation. -On continuous bladder irrigation.  2.  Acute on chronic anemia-hemoglobin drop noted from 7.5 to 5.3 - recheck confirmed the same - 2 units transfusion  ordered -Monitor closely and keep hemoglobin greater than 7  3.  Neutropenic fever-white cells dropped from 7 to 0.8 -Also received recent chemotherapy -Low-grade fevers noted.  Urine analysis negative for any infection.  Urine cultures of blood cultures are negative so far. -Started on cefepime for broad-spectrum coverage  4.  Metastatic prostate cancer-oncology consulted.  Getting chemotherapy as outpatient.  5.  Left-sided hydronephrosis-asymptomatic hydronephrosis in the past.  Follow-up renal ultrasound  6.  Poor oral intake-we will consult dietitian.  Concern for chronic diarrhea since his radiation treatment.  Add Lomotil  7.  DVT prophylaxis-teds and SCDs  Updated wife at bedside   All the records are reviewed and case discussed with Care Management/Social Workerr. Management plans discussed with the patient, family and they are in agreement.  CODE STATUS: DNR  TOTAL TIME TAKING CARE OF THIS PATIENT: 38 minutes.   POSSIBLE D/C IN 2-3 DAYS, DEPENDING ON CLINICAL CONDITION.   Gladstone Lighter M.D on 08/06/2017 at 2:32 PM  Between 7am to 6pm - Pager - [phone]  After 6pm go to [email or web address] - password [email or web address] [facility]  Office  [phone]  CC: Primary care physician; System, Pcp Not In

## 2017-08-06 NOTE — Consult Note (Signed)
Pharmacy Antibiotic Note  Hilmar Moldovan. is a 84 y.o. male admitted on 08/03/2017 with sepsis secondary to UTI. Patient now has febrile neutropenia.  Pharmacy has been consulted for cefepime dosing.   Plan: Start cefepime 2g IV every 12 hours.   Height: 5\' 8"  (172.7 cm) Weight: 186 lb 15.2 oz (84.8 kg) IBW/kg (Calculated) : 68.4  Temp (24hrs), Avg:99.7 F (37.6 C), Min:99.2 F (37.3 C), Max:100.4 F (38 C)  Recent Labs  Lab 07/31/17 0921 08/03/17 2241 08/05/17 0341 08/05/17 0722 08/06/17 0415 08/06/17 0844  WBC 14.6* 11.8* 7.0  --  1.0* 0.8*  CREATININE 1.30* 1.66* 1.68*  --  1.53*  --   LATICACIDVEN  --   --   --  0.8  --   --     Estimated Creatinine Clearance: 40.2 mL/min (A) (by C-G formula based on SCr of 1.53 mg/dL (H)).    No Known Allergies  Antimicrobials this admission: 7/30 CTX >> 7/31 7/31 cefepime >>    Microbiology results: 7/31 BCx: pending 7/31 UCx: NG Final  7/31 MRSA PCR: pending  Thank you for allowing pharmacy to be a part of this patient's care.  Pernell Dupre, PharmD, BCPS Clinical Pharmacist 08/06/2017 12:09 PM

## 2017-08-06 NOTE — Progress Notes (Signed)
Dr Tressia Miners made aware of CBC result and pt temp 100.4, acknowledged, ok to continue with blood transfusion

## 2017-08-06 NOTE — Consult Note (Signed)
[facility]  Date of admission:  08/04/2017  Inpatient day:  08/06/2017  Consulting physician: Dr. Gladstone Lighter.  Reason for Consultation:  Neutropenia.  Chief Complaint: Terry Wood. is a 84 y.o. 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  HPI:  The patient notes a history of diarrhea x 1 1/2 weeks felt secondary to radiation.  He completed palliative radiation the the sacrum and hip on 07/22/2017.  He received cycle #3 cabazitaxel on 07/31/2017.  He tolerated his chemotherapy well.  He did not receive Neulasta.  CBC at that time included a hematocrit of 27.4, hemoglobin 9.0, platelets 385,000, and WBC 14,600.  Creatinine was 1.30.  He describes becoming woozy and light headed with inability to void.  He presented to the ER.  He was orthostatic and received fluids.  A Foley catheter was placed.  Initial hemoglobin was 8.5.  Creatinine was 1.66.  He had had significant hematuria necessitating continuous bladder irrigation and replacement of the Foley.  He developed a fever of 102.8 on 08/04/2017.  He has been pan cultured (blood and urine).  CXR yesterday revealed no acute abnormality.  He was initially placed on ceftriaxone.  Counts today include a hematocrit of15.8, hemoglobin 5.3, platelets 136,00, WBC 800.  He is currently receiving 2 units of PRBCs.  Creatinine is 1.53.  Symptomatically, he feels fatigued.  He has ongoing hematuria with a Foley in place.  He has 2-3 episodes of watery diarrhea/day.  He denies pain.   Past Medical History:  Diagnosis Date  . Cancer of prostate ([place]) 07/13/2014  . Diabetes mellitus without complication ([place])   . Hypertension     History reviewed. No pertinent surgical history.  History reviewed. No pertinent family history.  Social History:  reports that he has been smoking cigarettes.   He has never used smokeless tobacco. He reports that he drank alcohol. He reports that he does not use drugs.  He is accompanied by his daughter, Terry Wood, and 2 daughter-in-laws, Terry Wood and Terry Wood.  Allergies: No Known Allergies  Medications Prior to Admission  Medication Sig Dispense Refill  . allopurinol (ZYLOPRIM) 100 MG tablet Take 100 mg by mouth daily.     Marland Kitchen aspirin EC 81 MG tablet Take 81 mg by mouth daily.    . hydrochlorothiazide (HYDRODIURIL) 12.5 MG tablet Take 12.5 mg by mouth daily.   1  . HYDROcodone-acetaminophen (NORCO/VICODIN) 5-325 MG tablet Take 1 tablet by mouth every 6 (six) hours as needed for moderate pain. 30 tablet 0  . Omega-3 Fatty Acids (FISH OIL) 1000 MG CAPS Take 2,000 mg by mouth 2 (two) times daily.     . tamsulosin (FLOMAX) 0.4 MG CAPS capsule Take 0.4 mg by mouth 2 (two) times daily.      Review of Systems: GENERAL:  Fatigue.  Fever up to 102.8 on 08/04/2017, now low grade.  No sweats or weight loss. PERFORMANCE STATUS (ECOG):  2 HEENT:  No visual changes, runny nose, sore throat, mouth sores or tenderness. Lungs: No shortness of breath or cough.  No hemoptysis. Cardiac:  No chest pain, palpitations, orthopnea, or PND. GI:  Poor oral intake as aggravates diarrhea.  Watery diarrhea 2-3 times/day.  No nausea, vomiting,constipation, melena or hematochezia. GU:  Foley in place with CBI.  Hematuria.  No urgency, frequency, dysuria. Musculoskeletal:  No back pain.  No joint pain.  No muscle  tenderness. Extremities:  No pain or swelling. Skin:  Skin tear.  No rashes or skin changes. Neuro:  No headache, numbness or weakness, balance or coordination issues. Endocrine:  No diabetes, thyroid issues, hot flashes or night sweats. Psych:  No mood changes, depression or anxiety. Pain:  No focal pain. Review of systems:  All other systems reviewed and found to be negative.  Physical Exam:  Blood pressure (!) 124/41, pulse (!) 101, temperature 98 F (36.7 C),  temperature source Oral, resp. rate 20, height _0  (1.727 m), weight 186 lb 15.2 oz (84.8 kg), SpO2 95 %.  GENERAL:  Well developed, well nourished, elderly gentleman lying comfortably on the medical unit in no acute distress. MENTAL STATUS:  Alert and oriented to person, place and time. HEAD:  Short gray hair.  Normocephalic, atraumatic, face symmetric, no Cushingoid features. EYES:  Pupils equal round and reactive to light and accomodation.  No conjunctivitis or scleral icterus. ENT:  Oropharynx clear without lesion.  Tongue normal. Mucous membranes moist.  RESPIRATORY:  Clear to auscultation without rales, wheezes or rhonchi. CARDIOVASCULAR:  Regular rate and rhythm without murmur, rub or gallop. ABDOMEN:  Soft, non-tender, with active bowel sounds, and no hepatosplenomegaly.  No masses. GU:  Foley catheter in place.  Urine bloody. SKIN:  Right upper arm skin tear with overlying dressing.  No rashes, ulcers or lesions. EXTREMITIES: No edema, no skin discoloration or tenderness.  No palpable cords. LYMPH NODES: No palpable cervical, supraclavicular, axillary or inguinal adenopathy  NEUROLOGICAL: Unremarkable. PSYCH:  Appropriate.   Results for orders placed or performed during the hospital encounter of 08/03/17 (from the past 48 hour(s))  Glucose, capillary     Status: Abnormal   Collection Time: 08/04/17 11:09 PM  Result Value Ref Range   Glucose-Capillary 121 (H) 70 - 99 mg/dL  Basic metabolic panel     Status: Abnormal   Collection Time: 08/05/17  3:41 AM  Result Value Ref Range   Sodium 139 135 - 145 mmol/L   Potassium 4.8 3.5 - 5.1 mmol/L    Comment: HEMOLYSIS AT THIS LEVEL MAY AFFECT RESULT   Chloride 111 98 - 111 mmol/L   CO2 15 (L) 22 - 32 mmol/L   Glucose, Bld 119 (H) 70 - 99 mg/dL   BUN 34 (H) 8 - 23 mg/dL   Creatinine, Ser 1.68 (H) 0.61 - 1.24 mg/dL   Calcium 6.7 (L) 8.9 - 10.3 mg/dL   GFR calc non Af Amer 37 (L) >60 mL/min   GFR calc Af Amer 42 (L) >60 mL/min     Comment: (NOTE) The eGFR has been calculated using the CKD EPI equation. This calculation has not been validated in all clinical situations. eGFR's persistently <60 mL/min signify possible Chronic Kidney Disease.    Anion gap 13 5 - 15    Comment: Performed at [facility], [address] [place], [place] [address]  CBC with Differential/Platelet     Status: Abnormal   Collection Time: 08/05/17  3:41 AM  Result Value Ref Range   WBC 7.0 3.8 - 10.6 K/uL   RBC 2.50 (L) 4.40 - 5.90 MIL/uL   Hemoglobin 7.5 (L) 13.0 - 18.0 g/dL   HCT 23.4 (L) 40.0 - 52.0 %   MCV 93.6 80.0 - 100.0 fL   MCH 29.9 26.0 - 34.0 pg   MCHC 31.9 (L) 32.0 - 36.0 g/dL   RDW 18.1 (H) 11.5 - 14.5 %   Platelets 170 150 - 440 K/uL  Neutrophils Relative % 93 %   Neutro Abs 6.5 1.4 - 6.5 K/uL   Lymphocytes Relative 6 %   Lymphs Abs 0.4 (L) 1.0 - 3.6 K/uL   Monocytes Relative 0 %   Monocytes Absolute 0.0 (L) 0.2 - 1.0 K/uL   Eosinophils Relative 0 %   Eosinophils Absolute 0.0 0 - 0.7 K/uL   Basophils Relative 1 %   Basophils Absolute 0.1 0 - 0.1 K/uL    Comment: Performed at [facility], [address] [place], [place] [address]  Lactic acid, plasma     Status: None   Collection Time: 08/05/17  7:22 AM  Result Value Ref Range   Lactic Acid, Venous 0.8 0.5 - 1.9 mmol/L    Comment: Performed at [facility], [address] [place], [place] [address]  CULTURE, BLOOD (ROUTINE X 2) w Reflex to ID Panel     Status: None (Preliminary result)   Collection Time: 08/05/17  7:22 AM  Result Value Ref Range   Specimen Description BLOOD LEFT HAND    Special Requests      BOTTLES DRAWN AEROBIC AND ANAEROBIC Blood Culture results may not be optimal due to an excessive volume of blood received in culture bottles   Culture      NO GROWTH < 24 HOURS Performed at [facility], [address] [place], [place] [address]    Report Status PENDING   CULTURE, BLOOD (ROUTINE X 2) w Reflex  to ID Panel     Status: None (Preliminary result)   Collection Time: 08/05/17  7:23 AM  Result Value Ref Range   Specimen Description BLOOD RIGHT AC    Special Requests      BOTTLES DRAWN AEROBIC AND ANAEROBIC Blood Culture results may not be optimal due to an inadequate volume of blood received in culture bottles   Culture      NO GROWTH < 24 HOURS Performed at [facility], [address] [place], [place] [address]    Report Status PENDING   Glucose, capillary     Status: Abnormal   Collection Time: 08/05/17  7:38 AM  Result Value Ref Range   Glucose-Capillary 109 (H) 70 - 99 mg/dL  Glucose, capillary     Status: Abnormal   Collection Time: 08/05/17 11:36 AM  Result Value Ref Range   Glucose-Capillary 115 (H) 70 - 99 mg/dL  Glucose, capillary     Status: Abnormal   Collection Time: 08/05/17  4:40 PM  Result Value Ref Range   Glucose-Capillary 136 (H) 70 - 99 mg/dL  Glucose, capillary     Status: Abnormal   Collection Time: 08/05/17  9:36 PM  Result Value Ref Range   Glucose-Capillary 160 (H) 70 - 99 mg/dL  CBC with Differential/Platelet     Status: Abnormal   Collection Time: 08/06/17  4:15 AM  Result Value Ref Range   WBC 1.0 (LL) 3.8 - 10.6 K/uL    Comment: CRITICAL RESULT CALLED TO, READ BACK BY AND VERIFIED WITH: SYLVIA FUENTES ON 08/06/17 AT 0546 JAG    RBC 1.79 (L) 4.40 - 5.90 MIL/uL   Hemoglobin 5.3 (L) 13.0 - 18.0 g/dL    Comment: RESULT REPEATED AND VERIFIED   HCT 15.8 (L) 40.0 - 52.0 %   MCV 88.0 80.0 - 100.0 fL   MCH 29.6 26.0 - 34.0 pg   MCHC 33.6 32.0 - 36.0 g/dL   RDW 17.1 (H) 11.5 - 14.5 %   Platelets 146 (L)  150 - 440 K/uL   Neutrophils Relative % 74 %   Neutro Abs 0.7 (L) 1.4 - 6.5 K/uL   Lymphocytes Relative 23 %   Lymphs Abs 0.2 (L) 1.0 - 3.6 K/uL   Monocytes Relative 3 %   Monocytes Absolute 0.0 (L) 0.2 - 1.0 K/uL   Eosinophils Relative 1 %   Eosinophils Absolute 0.0 0 - 0.7 K/uL   Basophils Relative 1 %   Basophils Absolute 0.0 0 -  0.1 K/uL    Comment: Performed at [facility], [address] [place], [place] [address]  Basic metabolic panel     Status: Abnormal   Collection Time: 08/06/17  4:15 AM  Result Value Ref Range   Sodium 139 135 - 145 mmol/L   Potassium 3.0 (L) 3.5 - 5.1 mmol/L   Chloride 111 98 - 111 mmol/L   CO2 19 (L) 22 - 32 mmol/L   Glucose, Bld 142 (H) 70 - 99 mg/dL   BUN 37 (H) 8 - 23 mg/dL   Creatinine, Ser 1.53 (H) 0.61 - 1.24 mg/dL   Calcium 6.5 (L) 8.9 - 10.3 mg/dL   GFR calc non Af Amer 41 (L) >60 mL/min   GFR calc Af Amer 47 (L) >60 mL/min    Comment: (NOTE) The eGFR has been calculated using the CKD EPI equation. This calculation has not been validated in all clinical situations. eGFR's persistently <60 mL/min signify possible Chronic Kidney Disease.    Anion gap 9 5 - 15    Comment: Performed at [facility], [address] [place], [place] [address]  Magnesium     Status: Abnormal   Collection Time: 08/06/17  4:15 AM  Result Value Ref Range   Magnesium 1.5 (L) 1.7 - 2.4 mg/dL    Comment: Performed at [facility], [address] [place], [place] [address]  ABO/Rh     Status: None   Collection Time: 08/06/17  4:15 AM  Result Value Ref Range   ABO/RH(D)      A POS Performed at [facility], [address] [place], [place] [address]   Prepare RBC     Status: None   Collection Time: 08/06/17  6:01 AM  Result Value Ref Range   Order Confirmation      ORDER PROCESSED BY BLOOD BANK Performed at [facility], [address] [place], [place] [address]   Type and screen Hold 2 units now and stay ahead 2 units at all times     Status: None (Preliminary result)   Collection Time: 08/06/17  7:04 AM  Result Value Ref Range   ABO/RH(D) A POS    Antibody Screen NEG    Sample Expiration 08/09/2017    Unit Number [id number]    Blood Component Type RED CELLS,LR    Unit division 00    Status of Unit ISSUED    Transfusion Status  OK TO TRANSFUSE    Crossmatch Result Compatible    Unit Number [id number]    Blood Component Type RED CELLS,LR    Unit division 00    Status of Unit ISSUED    Transfusion Status OK TO TRANSFUSE    Crossmatch Result      Compatible Performed at [facility], [address] [place], [place] [address]    Unit Number [id number]    Blood Component Type RED CELLS,LR    Unit division 00    Status of Unit ALLOCATED    Transfusion Status  OK TO TRANSFUSE    Crossmatch Result Compatible    Unit Number [id number]    Blood Component Type RED CELLS,LR    Unit division 00    Status of Unit ALLOCATED    Transfusion Status OK TO TRANSFUSE    Crossmatch Result Compatible   Glucose, capillary     Status: Abnormal   Collection Time: 08/06/17  7:37 AM  Result Value Ref Range   Glucose-Capillary 111 (H) 70 - 99 mg/dL  CBC     Status: Abnormal   Collection Time: 08/06/17  8:44 AM  Result Value Ref Range   WBC 0.8 (LL) 3.8 - 10.6 K/uL    Comment: CRITICAL VALUE NOTED.  VALUE IS CONSISTENT WITH PREVIOUSLY REPORTED AND CALLED VALUE. RESULT REPEATED AND VERIFIED    RBC 1.78 (L) 4.40 - 5.90 MIL/uL   Hemoglobin 5.3 (L) 13.0 - 18.0 g/dL   HCT 15.8 (L) 40.0 - 52.0 %   MCV 88.8 80.0 - 100.0 fL   MCH 29.6 26.0 - 34.0 pg   MCHC 33.4 32.0 - 36.0 g/dL   RDW 17.3 (H) 11.5 - 14.5 %   Platelets 136 (L) 150 - 440 K/uL    Comment: Performed at [facility], [address] [place], [place] [address]  Glucose, capillary     Status: Abnormal   Collection Time: 08/06/17 12:05 PM  Result Value Ref Range   Glucose-Capillary 115 (H) 70 - 99 mg/dL  MRSA PCR Screening     Status: None   Collection Time: 08/06/17  4:05 PM  Result Value Ref Range   MRSA by PCR NEGATIVE NEGATIVE    Comment:        The GeneXpert MRSA Assay (FDA approved for NASAL specimens only), is one component of a comprehensive MRSA colonization surveillance program. It is not intended to diagnose  MRSA infection nor to guide or monitor treatment for MRSA infections. Performed at [facility], [address] [place], [place] [address]   Glucose, capillary     Status: Abnormal   Collection Time: 08/06/17  4:40 PM  Result Value Ref Range   Glucose-Capillary 107 (H) 70 - 99 mg/dL   Dg Chest 2 View  Result Date: 08/06/2017 CLINICAL DATA:  84 year old male with fever, weakness, chest pain. EXAM: CHEST - 2 VIEW COMPARISON:  Chest radiograph 07/20/2014.  Chest CT 11/12/2016. FINDINGS: Seated AP and lateral views of the chest. Lung volumes and mediastinal contours are stable and within normal limits. Visualized tracheal air column is within normal limits. No pneumothorax, pulmonary edema, pleural effusion or confluent pulmonary opacity. No acute osseous abnormality identified. Negative visible bowel gas pattern. IMPRESSION: No acute cardiopulmonary abnormality. Electronically Signed   By: Genevie Ann M.D.   On: 08/06/2017 14:37   US Renal  Result Date: 08/06/2017 CLINICAL DATA:  Follow-up left-sided hydronephrosis. Currently undergoing treatment for prostate malignancy. EXAM: RENAL / URINARY TRACT ULTRASOUND COMPLETE COMPARISON:  Nuclear bone scan of April 01, 2017 and CT scan of the abdomen and pelvis of the same day. FINDINGS: Right Kidney: Length: 14.5 cm. The renal cortical echotexture remains lower than that of the liver. There is mild hydronephrosis. There is an upper pole simple appearing cyst measuring 1.4 cm in greatest dimension. In the lower pole there is a solid-appearing mixed echogenicity mass measuring 3.8 x 3.3 x 3.6 cm. Left Kidney: Length: 13 cm. There is moderate hydronephrosis. The renal cortical echotexture is similar to that of the right kidney. There is an upper pole  exophytic cortical cyst measuring 3.2 x 2.6 x 3.0 cm. Bladder: The urinary bladder is largely decompressed by a Foley catheter. IMPRESSION: Mild right-sided hydronephrosis. Solid-appearing lower pole right  renal mass which measures 3.8 cm in diameter and is similar to that seen on the CT scan in March 2019. Moderate hydronephrosis on the left. Simple appearing upper pole cortical cyst. Electronically Signed   By: David  Martinique M.D.   On: 08/06/2017 14:44    Assessment:  The patient is a 84 y.o. gentleman with metastatic prostate cancer currently day 7 s/p cycle #3 cabazitaxel.  He presented with syncope secondary to orthostatic hypotension.    He has hematuria with Foley in place and CBI.  He has chronic anemia exacerbated by hematuria and recent chemotherapy.    He has fever and neutropenia.  Blood and urine cultures are negative to date.  CXR is negative.  He has diarrhea felt secondary to radiation.  He is on Cefepime.  Plan:   1.  Oncology:  Day 7 s/p cycle #3 cabazitaxel for castrate resistant metastatic prostate cancer.  He recently completed palliative radiation.  Patient followed by Dr. Grayland Ormond in clinic.  2.  Hematology:  Patient has developed neutropenia as he did with cycle #1 cabazitaxel on day 7 post chemotherapy (not checked with cycle #2).  Patient has been considered in the outpatient department for [facility].  Discuss initiation of daily Granix/Neupogen until counts recover.  Neutropenic precautions.  Transfuse PRBCs as needed given ongoing hematuria to maintain a hemoglobin 7-8.  Agree with holding anticoagulation.  CBC with diff daily.  3.  Urology:  Continuous bladder irrigation for ongoing hematuria.  Abdomen CT on 04/01/2017 revealed an enhancing lesion in the prostate directly invading the left posterior bladder with moderate left sided hydroureteronephrosis.  Renal ultrasound revealed mild right sided hydronephrosis and a stable 3.8 cm solid right renal mass.    4.  Infectious disease:  Fever and neutropenia without source.  Follow cultures.  Agree with switch from ceftriaxone to Cefepime.  5.  Gastroenterology:  Diarrhea felt secondary to radiation.  Check stool for GI panel  by PCR and C diff to ensure no infection.  6.  Code status:  DNR per chart.   Thank you for allowing me to participate in Terry Wood. 's care.  I will follow him closely with you while hospitalized and after discharge in the outpatient department.   Lequita Asal, MD  08/06/2017, 6:38 PM

## 2017-08-07 LAB — HEPATIC FUNCTION PANEL
ALT: 38 U/L (ref 0–44)
AST: 72 U/L — ABNORMAL HIGH (ref 15–41)
Albumin: 2.1 g/dL — ABNORMAL LOW (ref 3.5–5.0)
Alkaline Phosphatase: 51 U/L (ref 38–126)
BILIRUBIN DIRECT: 0.4 mg/dL — AB (ref 0.0–0.2)
Indirect Bilirubin: 0.3 mg/dL (ref 0.3–0.9)
Total Bilirubin: 0.7 mg/dL (ref 0.3–1.2)
Total Protein: 5.1 g/dL — ABNORMAL LOW (ref 6.5–8.1)

## 2017-08-07 LAB — BASIC METABOLIC PANEL
Anion gap: 8 (ref 5–15)
BUN: 29 mg/dL — ABNORMAL HIGH (ref 8–23)
CO2: 17 mmol/L — ABNORMAL LOW (ref 22–32)
Calcium: 6.3 mg/dL — CL (ref 8.9–10.3)
Chloride: 113 mmol/L — ABNORMAL HIGH (ref 98–111)
Creatinine, Ser: 1.37 mg/dL — ABNORMAL HIGH (ref 0.61–1.24)
GFR calc Af Amer: 54 mL/min — ABNORMAL LOW (ref >60)
GFR calc non Af Amer: 47 mL/min — ABNORMAL LOW (ref >60)
GLUCOSE: 84 mg/dL (ref 70–99)
Potassium: 3.1 mmol/L — ABNORMAL LOW (ref 3.5–5.1)
Sodium: 138 mmol/L (ref 135–145)

## 2017-08-07 LAB — CBC WITH DIFFERENTIAL/PLATELET
BASOS ABS: 0 10*3/uL (ref 0–0.1)
BLASTS: 0 %
Band Neutrophils: 1 %
Basophils Relative: 0 %
Eosinophils Absolute: 0 10*3/uL (ref 0–0.7)
Eosinophils Relative: 1 %
HCT: 23 % — ABNORMAL LOW (ref 40.0–52.0)
Hemoglobin: 7.7 g/dL — ABNORMAL LOW (ref 13.0–18.0)
LYMPHS PCT: 46 %
Lymphs Abs: 0.3 10*3/uL — ABNORMAL LOW (ref 1.0–3.6)
MCH: 30 pg (ref 26.0–34.0)
MCHC: 33.5 g/dL (ref 32.0–36.0)
MCV: 89.5 fL (ref 80.0–100.0)
METAMYELOCYTES PCT: 2 %
MONOS PCT: 6 %
Monocytes Absolute: 0 10*3/uL — ABNORMAL LOW (ref 0.2–1.0)
Myelocytes: 2 %
Neutro Abs: 0.3 10*3/uL — ABNORMAL LOW (ref 1.4–6.5)
Neutrophils Relative %: 42 %
OTHER: 0 %
Platelets: 128 10*3/uL — ABNORMAL LOW (ref 150–440)
Promyelocytes Relative: 0 %
RBC: 2.57 MIL/uL — AB (ref 4.40–5.90)
RDW: 16 % — ABNORMAL HIGH (ref 11.5–14.5)
WBC: 0.6 10*3/uL — AB (ref 3.8–10.6)
nRBC: 0 /100 WBC

## 2017-08-07 LAB — GLUCOSE, CAPILLARY
Glucose-Capillary: 79 mg/dL (ref 70–99)
Glucose-Capillary: 105 mg/dL — ABNORMAL HIGH (ref 70–99)
Glucose-Capillary: 107 mg/dL — ABNORMAL HIGH (ref 70–99)
Glucose-Capillary: 118 mg/dL — ABNORMAL HIGH (ref 70–99)

## 2017-08-07 LAB — GASTROINTESTINAL PANEL BY PCR, STOOL (REPLACES STOOL CULTURE)

## 2017-08-07 LAB — C DIFFICILE QUICK SCREEN W PCR REFLEX
C Diff antigen: NEGATIVE
C Diff interpretation: NOT DETECTED
C Diff toxin: NEGATIVE

## 2017-08-07 LAB — MAGNESIUM: Magnesium: 1.4 mg/dL — ABNORMAL LOW (ref 1.7–2.4)

## 2017-08-07 MED ORDER — POTASSIUM CHLORIDE CRYS ER 20 MEQ PO TBCR
40.0000 meq | EXTENDED_RELEASE_TABLET | Freq: Once | ORAL | Status: AC
  Administered 2017-08-07: 06:00:00 40 meq via ORAL
  Filled 2017-08-07: qty 2

## 2017-08-07 MED ORDER — ALBUMIN HUMAN 25 % IV SOLN
25.0000 g | Freq: Once | INTRAVENOUS | Status: AC
  Administered 2017-08-07: 13:00:00 25 g via INTRAVENOUS
  Filled 2017-08-07 (×2): qty 100

## 2017-08-07 MED ORDER — ENSURE ENLIVE PO LIQD: 237.0000 mL | Freq: Three times a day (TID) | ORAL | Status: DC | PRN

## 2017-08-07 MED ORDER — RISAQUAD PO CAPS
1.0000 | ORAL_CAPSULE | Freq: Every day | ORAL | Status: DC
  Administered 2017-08-07 – 2017-08-14 (×6): 1 via ORAL
  Filled 2017-08-07 (×7): qty 1

## 2017-08-07 MED ORDER — MAGNESIUM SULFATE 2 GM/50ML IV SOLN
2.0000 g | INTRAVENOUS | Status: AC
  Administered 2017-08-07 (×2): 2 g via INTRAVENOUS
  Filled 2017-08-07 (×2): qty 50

## 2017-08-07 NOTE — Progress Notes (Signed)
Urology Consult Follow Up  Subjective: Hemoglobin improved.  Hematuria has almost completely resolved on extremely slow drip with clear urine.  Feeling more energetic today.  Renal ultrasound shows moderate left hydronephrosis, mild right.  Creatinine improved slightly but not back down to baseline from last year.  Anti-infectives: Anti-infectives (From admission, onward)   Start     Dose/Rate Route Frequency Ordered Stop   08/06/17 1215  ceFEPIme (MAXIPIME) 2 g in sodium chloride 0.9 % 100 mL IVPB     2 g 200 mL/hr over 30 Minutes Intravenous Every 12 hours 08/06/17 1203     08/05/17 0700  cefTRIAXone (ROCEPHIN) 1 g in sodium chloride 0.9 % 100 mL IVPB  Status:  Discontinued     1 g 200 mL/hr over 30 Minutes Intravenous Every 24 hours 08/05/17 0647 08/06/17 1157      Current Facility-Administered Medications  Medication Dose Route Frequency Provider Last Rate Last Dose  . 0.9 %  sodium chloride infusion   Intravenous Continuous Hillary Bow, MD 75 mL/hr at 08/07/17 1005    . acetaminophen (TYLENOL) tablet 650 mg  650 mg Oral Q6H PRN Harrie Foreman, MD   650 mg at 08/06/17 2047   Or  . acetaminophen (TYLENOL) suppository 650 mg  650 mg Rectal Q6H PRN Harrie Foreman, MD      . acidophilus (RISAQUAD) capsule 1 capsule  1 capsule Oral Daily Gladstone Lighter, MD   1 capsule at 08/07/17 1308  . ceFEPIme (MAXIPIME) 2 g in sodium chloride 0.9 % 100 mL IVPB  2 g Intravenous Q12H Pernell Dupre, RPH   Stopped at 08/07/17 1204  . feeding supplement (ENSURE ENLIVE) (ENSURE ENLIVE) liquid 237 mL  237 mL Oral TID PRN Gladstone Lighter, MD      . HYDROcodone-acetaminophen (NORCO/VICODIN) 5-325 MG per tablet 1 tablet  1 tablet Oral Q4H PRN Harrie Foreman, MD   1 tablet at 08/04/17 0606  . insulin aspart (novoLOG) injection 0-9 Units  0-9 Units Subcutaneous TID WC Hillary Bow, MD   1 Units at 08/05/17 1655  . loperamide (IMODIUM) capsule 2 mg  2 mg Oral Q6H PRN Hillary Bow, MD    2 mg at 08/06/17 1300  . nicotine (NICODERM CQ - dosed in mg/24 hours) patch 21 mg  21 mg Transdermal Daily Harrie Foreman, MD   21 mg at 08/07/17 1022  . omega-3 acid ethyl esters (LOVAZA) capsule 2 g  2 g Oral BID Hillary Bow, MD   2 g at 08/07/17 1018  . ondansetron (ZOFRAN) tablet 4 mg  4 mg Oral Q6H PRN Harrie Foreman, MD       Or  . ondansetron [facility]) injection 4 mg  4 mg Intravenous Q6H PRN Harrie Foreman, MD   4 mg at 08/04/17 0423  . promethazine (PHENERGAN) injection 25 mg  25 mg Intravenous Q8H PRN Harrie Foreman, MD   25 mg at 08/04/17 8341  . sodium chloride irrigation 0.9 % 3,000 mL  3,000 mL Irrigation Continuous Hillary Bow, MD   3,000 mL at 08/07/17 0619  . tamsulosin (FLOMAX) capsule 0.4 mg  0.4 mg Oral Daily Sudini, Alveta Heimlich, MD   0.4 mg at 08/07/17 1022  . Tbo-Filgrastim [facility]) injection 480 mcg  480 mcg Subcutaneous q1800 Lequita Asal, MD   480 mcg at 08/06/17 2154   Facility-Administered Medications Ordered in Other Encounters  Medication Dose Route Frequency Provider Last Rate Last Dose  . ondansetron (ZOFRAN) injection 8 mg  8 mg Intravenous Once Lloyd Huger, MD         Objective: Vital signs in last 24 hours: Temp:  [98 F (36.7 C)-100.7 F (38.2 C)] 98.1 F (36.7 C) (08/01 1515) Pulse Rate:  [86-101] 94 (08/01 1515) Resp:  [16-20] 20 (08/01 1515) BP: (111-132)/(41-64) 132/64 (08/01 1515) SpO2:  [95 %-100 %] 99 % (08/01 1515) Weight:  [186 lb 11.2 oz (84.7 kg)] 186 lb 11.2 oz (84.7 kg) (08/01 0344)  Intake/Output from previous day: 07/31 0701 - 08/01 0700 In: 5401.3 [I.V.:1681.3; Blood:620; IV Piggyback:100] Out: 11150 [Urine:11150] Intake/Output this shift: Total I/O In: 1600.4 [I.V.:1308.4; IV Piggyback:292] Out: 660 [Urine:660]   Physical Exam  Alert and oriented Abdomen soft nondistended nontender Hematuria catheter in place draining clear urine on very slow drip  Lab Results:  Recent Labs     08/06/17 0844 08/06/17 2012 08/07/17 0447  WBC 0.8*  --  0.6*  HGB 5.3* 8.1* 7.7*  HCT 15.8* 24.1* 23.0*  PLT 136*  --  128*   BMET Recent Labs    08/06/17 0415 08/07/17 0447  NA 139 138  K 3.0* 3.1*  CL 111 113*  CO2 19* 17*  GLUCOSE 142* 84  BUN 37* 29*  CREATININE 1.53* 1.37*  CALCIUM 6.5* 6.3*   CLINICAL DATA:  Follow-up left-sided hydronephrosis. Currently undergoing treatment for prostate malignancy.  EXAM: RENAL / URINARY TRACT ULTRASOUND COMPLETE  COMPARISON:  Nuclear bone scan of April 01, 2017 and CT scan of the abdomen and pelvis of the same day.  FINDINGS: Right Kidney:  Length: 14.5 cm. The renal cortical echotexture remains lower than that of the liver. There is mild hydronephrosis. There is an upper pole simple appearing cyst measuring 1.4 cm in greatest dimension. In the lower pole there is a solid-appearing mixed echogenicity mass measuring 3.8 x 3.3 x 3.6 cm.  Left Kidney:  Length: 13 cm. There is moderate hydronephrosis. The renal cortical echotexture is similar to that of the right kidney. There is an upper pole exophytic cortical cyst measuring 3.2 x 2.6 x 3.0 cm.  Bladder:  The urinary bladder is largely decompressed by a Foley catheter.  IMPRESSION: Mild right-sided hydronephrosis. Solid-appearing lower pole right renal mass which measures 3.8 cm in diameter and is similar to that seen on the CT scan in March 2019.  Moderate hydronephrosis on the left. Simple appearing upper pole cortical cyst.   Electronically Signed   By: David  Martinique M.D.   On: 08/06/2017 14:44  RUS personally reviewed  Assessment: 84 year old male with gross hematuria secondary to metastatic castrate resistant prostate cancer hematuria which he needs to improve.  Patient does have persistent moderate left hydronephrosis and mild right hydronephrosis.  In addition, his creatinine has improved during this admission although not back to baseline  from last year.   Plan: -CBI off today, will plan for voiding trial tomorrow morning -Continue to monitor hemoglobin and hold anticoagulants -Discussion today of how to manage chronic urinary obstruction, left greater than right, minimal rise in overall creatinine, options including continued conservative management versus nephrostomy tube were discussed.  Possible internalization could have been down the road.  At this point in time, he like to continue conservative management will consider his options.  His daughter was present today during our conversation as well.  Urology will continue to follow closely    LOS: 3 days    Hollice Espy 08/07/2017

## 2017-08-07 NOTE — Care Management Important Message (Signed)
Important Message  Patient Details  Name: Terry Wood. MRN: [id number] Date of Birth: [birth year]   Medicare Important Message Given:  Yes    Shelbie Ammons, RN 08/07/2017, 1:01 PM

## 2017-08-07 NOTE — Progress Notes (Addendum)
[place] [facility] [place] NAME: Terry Wood    MR#:  [id number]  DATE OF BIRTH:  09-23-1933  SUBJECTIVE:  CHIEF COMPLAINT:   Chief Complaint  Patient presents with  . Diarrhea  . Hematuria  . Loss of Consciousness   -Hematuria improved this morning-pink-tinged urine at the time.  When patient was moved around in bed, his urine started bloody again -Started on Granix injections as recommended by hematology. -Continues to have diarrhea  REVIEW OF SYSTEMS:  Review of Systems  Constitutional: Positive for malaise/fatigue. Negative for chills and fever.  HENT: Negative for congestion, ear discharge, hearing loss and nosebleeds.   Eyes: Negative for blurred vision and double vision.  Respiratory: Negative for cough, shortness of breath and wheezing.   Cardiovascular: Negative for chest pain and palpitations.  Gastrointestinal: Positive for diarrhea. Negative for abdominal pain, constipation, nausea and vomiting.  Genitourinary: Positive for hematuria. Negative for dysuria.  Musculoskeletal: Positive for myalgias.  Neurological: Negative for dizziness, focal weakness, seizures, weakness and headaches.  Psychiatric/Behavioral: Negative for depression.    DRUG ALLERGIES:  No Known Allergies  VITALS:  Blood pressure (!) 118/45, pulse 88, temperature 99.3 F (37.4 C), temperature source Oral, resp. rate 17, height 5\' 8"  (1.727 m), weight 84.7 kg (186 lb 11.2 oz), SpO2 100 %.  PHYSICAL EXAMINATION:  Physical Exam   GENERAL:  84 y.o.-year-old elderly patient lying in the bed with no acute distress.  EYES: Pupils equal, round, reactive to light and accommodation. No scleral icterus. Extraocular muscles intact.  HEENT: Head atraumatic, normocephalic. Oropharynx and nasopharynx clear.  NECK:  Supple, no jugular venous distention. No thyroid enlargement, no tenderness.  LUNGS: Normal breath sounds bilaterally, no wheezing, rales,rhonchi or  crepitation. No use of accessory muscles of respiration.  Decreased bibasilar breath sounds CARDIOVASCULAR: S1, S2 normal. No murmurs, rubs, or gallops.  ABDOMEN: Soft, nontender, minimal discomfort on palpation, nondistended. Bowel sounds present. No organomegaly or mass.  EXTREMITIES: No pedal edema, cyanosis, or clubbing.  NEUROLOGIC: Cranial nerves II through XII are intact. Muscle strength 5/5 in all extremities. Sensation intact. Gait not checked.  PSYCHIATRIC: The patient is alert and oriented x 3.  SKIN: No obvious rash, lesion, or ulcer.    LABORATORY PANEL:   CBC Recent Labs  Lab 08/07/17 0447  WBC 0.6*  HGB 7.7*  HCT 23.0*  PLT 128*   ------------------------------------------------------------------------------------------------------------------  Chemistries  Recent Labs  Lab 08/07/17 0447  NA 138  K 3.1*  CL 113*  CO2 17*  GLUCOSE 84  BUN 29*  CREATININE 1.37*  CALCIUM 6.3*  MG 1.4*  AST 72*  ALT 38  ALKPHOS 51  BILITOT 0.7   ------------------------------------------------------------------------------------------------------------------  Cardiac Enzymes Recent Labs  Lab 08/03/17 2241  TROPONINI <0.03   ------------------------------------------------------------------------------------------------------------------  RADIOLOGY:  Dg Chest 2 View  Result Date: 08/06/2017 CLINICAL DATA:  84 year old male with fever, weakness, chest pain. EXAM: CHEST - 2 VIEW COMPARISON:  Chest radiograph 07/20/2014.  Chest CT 11/12/2016. FINDINGS: Seated AP and lateral views of the chest. Lung volumes and mediastinal contours are stable and within normal limits. Visualized tracheal air column is within normal limits. No pneumothorax, pulmonary edema, pleural effusion or confluent pulmonary opacity. No acute osseous abnormality identified. Negative visible bowel gas pattern. IMPRESSION: No acute cardiopulmonary abnormality. Electronically Signed   By: Genevie Ann M.D.   On:  08/06/2017 14:37   US Renal  Result Date: 08/06/2017 CLINICAL DATA:  Follow-up left-sided hydronephrosis. Currently undergoing treatment  for prostate malignancy. EXAM: RENAL / URINARY TRACT ULTRASOUND COMPLETE COMPARISON:  Nuclear bone scan of April 01, 2017 and CT scan of the abdomen and pelvis of the same day. FINDINGS: Right Kidney: Length: 14.5 cm. The renal cortical echotexture remains lower than that of the liver. There is mild hydronephrosis. There is an upper pole simple appearing cyst measuring 1.4 cm in greatest dimension. In the lower pole there is a solid-appearing mixed echogenicity mass measuring 3.8 x 3.3 x 3.6 cm. Left Kidney: Length: 13 cm. There is moderate hydronephrosis. The renal cortical echotexture is similar to that of the right kidney. There is an upper pole exophytic cortical cyst measuring 3.2 x 2.6 x 3.0 cm. Bladder: The urinary bladder is largely decompressed by a Foley catheter. IMPRESSION: Mild right-sided hydronephrosis. Solid-appearing lower pole right renal mass which measures 3.8 cm in diameter and is similar to that seen on the CT scan in March 2019. Moderate hydronephrosis on the left. Simple appearing upper pole cortical cyst. Electronically Signed   By: David  Martinique M.D.   On: 08/06/2017 14:44    EKG:   Orders placed or performed in visit on 08/03/17  . EKG 12-Lead  . EKG 12-Lead  . EKG 12-Lead    ASSESSMENT AND PLAN:   84 year old male with past medical history significant for metastatic prostate cancer, hypertension and diabetes presents to hospital secondary to gross hematuria  1.  Hematuria-also with urinary retention. -Appreciate urology consult. -Hold any anticoagulation.  Has a Foley catheter -On continuous bladder irrigation.  2.  Acute on chronic anemia-hemoglobin drop noted from 7.5 to 5.3 -Received 2 units transfusion yesterday.  Hemoglobin still low at 7.7 -Monitor closely and keep hemoglobin greater than 7  3.  Neutropenic fever-white  cells dropped from 7 to 0.8 -Also received recent chemotherapy -Afebrile today.  Cultures negative so far.  Started on Granix injections.  WBC still at 0.6 -on cefepime for broad-spectrum coverage -Appreciate hematology consult  4.  Metastatic prostate cancer-oncology consulted.  Getting chemotherapy as outpatient.  5.  Left-sided hydronephrosis-asymptomatic hydronephrosis in the past.  Follow-up renal ultrasound with no change in moderate left-sided hydronephrosis.  Also has a right lower pole renal mass. -Follow-up with urology  6.  Poor oral intake-we will consult dietitian.  Concern for chronic diarrhea since his radiation treatment.  Added Lomotil and probiotics. -Has hypokalemia and hypomagnesemia.  Being replaced appropriately -Albumin is low-IV albumin ordered.  Corrected calcium is within normal limits  7.  DVT prophylaxis-teds and SCDs  Updated daughter at bedside PT consulted.   All the records are reviewed and case discussed with Care Management/Social Workerr. Management plans discussed with the patient, family and they are in agreement.  CODE STATUS: DNR  TOTAL TIME TAKING CARE OF THIS PATIENT: 39 minutes.   POSSIBLE D/C IN 2-3 DAYS, DEPENDING ON CLINICAL CONDITION.   Auston Halfmann M.D on 08/07/2017 at 1:50 PM  Between 7am to 6pm - Pager - [phone]  After 6pm go to [email or web address] - password [email or web address] [facility]  Office  [phone]  CC: Primary care physician; System, Pcp Not In

## 2017-08-07 NOTE — Progress Notes (Signed)
Nutrition Follow-up  DOCUMENTATION CODES:   Non-severe (moderate) malnutrition in context of chronic illness  INTERVENTION:   - Ensure Enlive po PRN, each supplement provides 350 kcal and 20 grams of protein (please provide vanilla or strawberry flavor)  - Magic cup TID with meals, each supplement provides 290 kcal and 9 grams of protein (pt prefers vanilla flavor)  - Ordered pt's lunch meal  - Encourage adequate PO intake  NUTRITION DIAGNOSIS:   Moderate Malnutrition related to chronic illness (prostate cancer) as evidenced by mild fat depletion, mild muscle depletion, moderate muscle depletion.  Ongoing, being addressed via oral nutrition supplements  GOAL:   Patient will meet greater than or equal to 90% of their needs  Progressing  MONITOR:   PO intake, Supplement acceptance, Labs, Weight trends, I & O's  REASON FOR ASSESSMENT:   Consult Poor PO  ASSESSMENT:   84 year old male with PMHx of HTN, DM, prostate cancer with retroperitoneal adenopathy on chemotherapy who is admitted with hematuria likely due to prostate cancer, anemia of chronic disease, AKI on CKD III, syncope.  Noted pt completed palliative radiation to the sacrum and hip on 07/22/17. Pt also received cycle #3 cabazitaxel on 07/31/17. Pt reports that he typically feels poorly 1-1.5 weeks after receiving chemo.  Pt on continuous bladder irrigation with hematuria improving. Urology is following pt closely. Per oncology note, diarrhea likely secondary to radiation.  Discussed pt with RN who reports pt is now refusing Ensure Enlive oral nutrition supplements. Spoke with pt and daughter at bedside. Pt reports that he has no appetite and does not want to eat or drink oral nutrition supplements due to ongoing diarrhea. RD discussed importance of adequate PO intake and took pt's lunch order: rice, fruit cup, and cottage cheese. Per pt request, RD to change order for Ensure Enlive to PRN.  Pt's weight is up 2 lbs  since admission. Suspect weight fluctuations related to fluid status.  Meal Completion: 0% per chart  Medications reviewed and include: 100 mg Colace BID, Ensure Enlive TID, sliding scale Novolog, Lovaza daily, 40 mEq K-dur daily, 2 grams magnesium sulfate q 2 hours  Labs reviewed: potassium 3.1 (L) - being repleted, chloride 113 (H), CO2 17 (L), BUN 29 (H), creatinine 1.37 (H), calcium 6.3 (L), magnesium 1.4 (L) - being repleted, WBC 0.6 (L), hemoglobin 7.7 (L), HCT 23.0 (L) CBG's: 79, 95, 107, 115 x 24 hours  UOP: 11,150 ml x 24 hours I/O's: -6.6 L since admission  Diet Order:   Diet Order           Diet regular Room service appropriate? Yes; Fluid consistency: Thin  Diet effective now          EDUCATION NEEDS:   Education needs have been addressed  Skin:  Skin Assessment: Reviewed RN Assessment  Last BM:  08/06/17 medium type 7  Height:   Ht Readings from Last 1 Encounters:  08/04/17 5\' 8"  (1.727 m)    Weight:   Wt Readings from Last 1 Encounters:  08/07/17 186 lb 11.2 oz (84.7 kg)    Ideal Body Weight:  70 kg  BMI:  Body mass index is 28.39 kg/m.  Estimated Nutritional Needs:   Kcal:  1980-2280 (MSJ x 1.3-1.5)  Protein:  100-115 grams (1.2-1.4 grams/kg)  Fluid:  2-2.2 L/day (1 mL/kcal)    Gaynell Face, MS, RD, LDN Pager: [phone] Weekend/After Hours: [phone]

## 2017-08-07 NOTE — Progress Notes (Addendum)
[facility] Hematology/Oncology Progress Note  Date of admission: 08/03/2017  Hospital day:  08/07/2017  Chief Complaint: Terry Samek. is a 84 y.o. 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  Subjective: He feels "the same".  Diarrhea and hematuria continue.  Social History: The patient is accompanied by his son today.  Allergies: No Known Allergies  Scheduled Medications: . acidophilus  1 capsule Oral Daily  . insulin aspart  0-9 Units Subcutaneous TID WC  . nicotine  21 mg Transdermal Daily  . omega-3 acid ethyl esters  2 g Oral BID  . tamsulosin  0.4 mg Oral Daily  . Tbo-Filgrastim  480 mcg Subcutaneous q1800    Review of Systems: GENERAL:  Fatigue.  No further fevers; no sweats or weight loss. PERFORMANCE STATUS (ECOG):  2 HEENT:  No visual changes, runny nose, sore throat, mouth sores or tenderness. Lungs: No shortness of breath or cough.  No hemoptysis. Cardiac:  No chest pain, palpitations, orthopnea, or PND. GI:  Diarrhea continues.  No nausea, vomiting, constipation, melena or hematochezia. GU:  Foley in place.  CBI on hold.  No urgency, frequency, or dysuria. Musculoskeletal:  No back pain.  No joint pain.  No muscle tenderness. Extremities:  No pain or swelling. Skin:  No rashes or skin changes. Neuro:  No headache, numbness or weakness, balance or coordination issues. Endocrine:  No diabetes, thyroid issues, hot flashes or night sweats. Psych:  No mood changes, depression or anxiety. Pain:  No focal pain. Review of systems:  All other systems reviewed and found to be negative.  Physical Exam: Blood pressure 132/64, pulse 94, temperature 98.1 F (36.7 C), temperature source Oral, resp. rate 20, height 5' 8" (1.727 m), weight 186 lb 11.2 oz (84.7 kg), SpO2 99 %.  GENERAL:  Well developed, well nourished,  elderly gentlean sitting comfortably in the exam room in no acute distress. MENTAL STATUS:  Alert and oriented to person, place and time. HEAD:  Short gray hair.  Normocephalic, atraumatic, face symmetric, no Cushingoid features. EYES:  Pupils equal round and reactive to light and accomodation.  No conjunctivitis or scleral icterus. ENT:  Oropharynx clear without lesion.  Tongue normal. Mucous membranes moist.  RESPIRATORY:  Clear to auscultation without rales, wheezes or rhonchi. CARDIOVASCULAR:  Regular rate and rhythm without murmur, rub or gallop. ABDOMEN:  Soft, non-tender, with active bowel sounds, and no hepatosplenomegaly.  No masses. GU:  Foley remains in place;  Urine bloody. SKIN:  Right upper arm dressing in place. EXTREMITIES: No edema, no skin discoloration or tenderness.  No palpable cords. LYMPH NODES: No palpable cervical, supraclavicular, axillary or inguinal adenopathy  NEUROLOGICAL: Unremarkable. PSYCH:  Appropriate.   Results for orders placed or performed during the hospital encounter of 08/03/17 (from the past 48 hour(s))  Glucose, capillary     Status: Abnormal   Collection Time: 08/05/17  9:36 PM  Result Value Ref Range   Glucose-Capillary 160 (H) 70 - 99 mg/dL  CBC with Differential/Platelet     Status: Abnormal   Collection Time: 08/06/17  4:15 AM  Result Value Ref Range   WBC 1.0 (LL) 3.8 - 10.6 K/uL    Comment: CRITICAL RESULT CALLED TO, READ BACK BY AND VERIFIED WITH: SYLVIA FUENTES ON 08/06/17 AT 0546 JAG    RBC 1.79 (L) 4.40 - 5.90 MIL/uL   Hemoglobin 5.3 (L) 13.0 - 18.0  g/dL    Comment: RESULT REPEATED AND VERIFIED   HCT 15.8 (L) 40.0 - 52.0 %   MCV 88.0 80.0 - 100.0 fL   MCH 29.6 26.0 - 34.0 pg   MCHC 33.6 32.0 - 36.0 g/dL   RDW 17.1 (H) 11.5 - 14.5 %   Platelets 146 (L) 150 - 440 K/uL   Neutrophils Relative % 74 %   Neutro Abs 0.7 (L) 1.4 - 6.5 K/uL   Lymphocytes Relative 23 %   Lymphs Abs 0.2 (L) 1.0 - 3.6 K/uL   Monocytes Relative 3 %    Monocytes Absolute 0.0 (L) 0.2 - 1.0 K/uL   Eosinophils Relative 1 %   Eosinophils Absolute 0.0 0 - 0.7 K/uL   Basophils Relative 1 %   Basophils Absolute 0.0 0 - 0.1 K/uL    Comment: Performed at [facility], [address] [place], [place] [address]  Basic metabolic panel     Status: Abnormal   Collection Time: 08/06/17  4:15 AM  Result Value Ref Range   Sodium 139 135 - 145 mmol/L   Potassium 3.0 (L) 3.5 - 5.1 mmol/L   Chloride 111 98 - 111 mmol/L   CO2 19 (L) 22 - 32 mmol/L   Glucose, Bld 142 (H) 70 - 99 mg/dL   BUN 37 (H) 8 - 23 mg/dL   Creatinine, Ser 1.53 (H) 0.61 - 1.24 mg/dL   Calcium 6.5 (L) 8.9 - 10.3 mg/dL   GFR calc non Af Amer 41 (L) >60 mL/min   GFR calc Af Amer 47 (L) >60 mL/min    Comment: (NOTE) The eGFR has been calculated using the CKD EPI equation. This calculation has not been validated in all clinical situations. eGFR's persistently <60 mL/min signify possible Chronic Kidney Disease.    Anion gap 9 5 - 15    Comment: Performed at [facility], [address] [place], [place] [address]  Magnesium     Status: Abnormal   Collection Time: 08/06/17  4:15 AM  Result Value Ref Range   Magnesium 1.5 (L) 1.7 - 2.4 mg/dL    Comment: Performed at [facility], [address] [place], [place] [address]  ABO/Rh     Status: None   Collection Time: 08/06/17  4:15 AM  Result Value Ref Range   ABO/RH(D)      A POS Performed at [facility], [address] [place], [place] [address]   Prepare RBC     Status: None   Collection Time: 08/06/17  6:01 AM  Result Value Ref Range   Order Confirmation      ORDER PROCESSED BY BLOOD BANK Performed at [facility], [address] [place], [place] [address]   Type and screen Hold 2 units now and stay ahead 2 units at all times     Status: None (Preliminary result)   Collection Time: 08/06/17  7:04 AM  Result Value Ref Range   ABO/RH(D) A POS    Antibody Screen NEG     Sample Expiration 08/09/2017    Unit Number [id number]    Blood Component Type RED CELLS,LR    Unit division 00    Status of Unit ISSUED,FINAL    Transfusion Status OK TO TRANSFUSE    Crossmatch Result Compatible    Unit Number [id number]    Blood Component Type RED CELLS,LR    Unit division 00    Status of Unit ISSUED,FINAL    Transfusion Status OK TO  TRANSFUSE    Crossmatch Result      Compatible Performed at [facility], [address] [place], [place] [address]    Unit Number [id number]    Blood Component Type RED CELLS,LR    Unit division 00    Status of Unit ALLOCATED    Transfusion Status OK TO TRANSFUSE    Crossmatch Result Compatible    Unit Number [id number]    Blood Component Type RED CELLS,LR    Unit division 00    Status of Unit ALLOCATED    Transfusion Status OK TO TRANSFUSE    Crossmatch Result Compatible   Glucose, capillary     Status: Abnormal   Collection Time: 08/06/17  7:37 AM  Result Value Ref Range   Glucose-Capillary 111 (H) 70 - 99 mg/dL  CBC     Status: Abnormal   Collection Time: 08/06/17  8:44 AM  Result Value Ref Range   WBC 0.8 (LL) 3.8 - 10.6 K/uL    Comment: CRITICAL VALUE NOTED.  VALUE IS CONSISTENT WITH PREVIOUSLY REPORTED AND CALLED VALUE. RESULT REPEATED AND VERIFIED    RBC 1.78 (L) 4.40 - 5.90 MIL/uL   Hemoglobin 5.3 (L) 13.0 - 18.0 g/dL   HCT 15.8 (L) 40.0 - 52.0 %   MCV 88.8 80.0 - 100.0 fL   MCH 29.6 26.0 - 34.0 pg   MCHC 33.4 32.0 - 36.0 g/dL   RDW 17.3 (H) 11.5 - 14.5 %   Platelets 136 (L) 150 - 440 K/uL    Comment: Performed at [facility], [address] [place], [place] [address]  Glucose, capillary     Status: Abnormal   Collection Time: 08/06/17 12:05 PM  Result Value Ref Range   Glucose-Capillary 115 (H) 70 - 99 mg/dL  MRSA PCR Screening     Status: None   Collection Time: 08/06/17  4:05 PM  Result Value Ref Range   MRSA by PCR NEGATIVE NEGATIVE    Comment:        The GeneXpert  MRSA Assay (FDA approved for NASAL specimens only), is one component of a comprehensive MRSA colonization surveillance program. It is not intended to diagnose MRSA infection nor to guide or monitor treatment for MRSA infections. Performed at [facility], [address] [place], [place] [address]   Glucose, capillary     Status: Abnormal   Collection Time: 08/06/17  4:40 PM  Result Value Ref Range   Glucose-Capillary 107 (H) 70 - 99 mg/dL  Hemoglobin and hematocrit, blood     Status: Abnormal   Collection Time: 08/06/17  8:12 PM  Result Value Ref Range   Hemoglobin 8.1 (L) 13.0 - 18.0 g/dL   HCT 24.1 (L) 40.0 - 52.0 %    Comment: Performed at [facility], [address] [place], [place] [address]  Glucose, capillary     Status: None   Collection Time: 08/06/17  9:08 PM  Result Value Ref Range   Glucose-Capillary 95 70 - 99 mg/dL  Gastrointestinal Panel by PCR , Stool     Status: Abnormal   Collection Time: 08/07/17  1:14 AM  Result Value Ref Range   Campylobacter species NOT DETECTED NOT DETECTED   Plesimonas shigelloides NOT DETECTED NOT DETECTED   Salmonella species NOT DETECTED NOT DETECTED   Yersinia enterocolitica NOT DETECTED NOT DETECTED   Vibrio species NOT DETECTED NOT DETECTED   Vibrio cholerae NOT DETECTED NOT DETECTED   Enteroaggregative E coli (EAEC) NOT DETECTED NOT DETECTED  Enteropathogenic E coli (EPEC) DETECTED (A) NOT DETECTED    Comment: RESULT CALLED TO, READ BACK BY AND VERIFIED WITH: SYLVIA FUENTES ON 08/07/17 AT 0323 JAG    Enterotoxigenic E coli (ETEC) NOT DETECTED NOT DETECTED   Shiga like toxin producing E coli (STEC) NOT DETECTED NOT DETECTED   Shigella/Enteroinvasive E coli (EIEC) NOT DETECTED NOT DETECTED   Cryptosporidium NOT DETECTED NOT DETECTED   Cyclospora cayetanensis NOT DETECTED NOT DETECTED   Entamoeba histolytica NOT DETECTED NOT DETECTED   Giardia lamblia NOT DETECTED NOT DETECTED   Adenovirus F40/41 NOT  DETECTED NOT DETECTED   Astrovirus NOT DETECTED NOT DETECTED   Norovirus GI/GII NOT DETECTED NOT DETECTED   Rotavirus A NOT DETECTED NOT DETECTED   Sapovirus (I, II, IV, and V) NOT DETECTED NOT DETECTED    Comment: Performed at [facility], [address] [place], [place] [address]  C difficile quick scan w PCR reflex     Status: None   Collection Time: 08/07/17  1:14 AM  Result Value Ref Range   C Diff antigen NEGATIVE NEGATIVE   C Diff toxin NEGATIVE NEGATIVE   C Diff interpretation No C. difficile detected.     Comment: Performed at [facility], [address] [place], [place] [address]  CBC with Differential/Platelet     Status: Abnormal   Collection Time: 08/07/17  4:47 AM  Result Value Ref Range   WBC 0.6 (LL) 3.8 - 10.6 K/uL    Comment: CRITICAL VALUE NOTED.  VALUE IS CONSISTENT WITH PREVIOUSLY REPORTED AND CALLED VALUE.   RBC 2.57 (L) 4.40 - 5.90 MIL/uL   Hemoglobin 7.7 (L) 13.0 - 18.0 g/dL   HCT 23.0 (L) 40.0 - 52.0 %   MCV 89.5 80.0 - 100.0 fL   MCH 30.0 26.0 - 34.0 pg   MCHC 33.5 32.0 - 36.0 g/dL   RDW 16.0 (H) 11.5 - 14.5 %   Platelets 128 (L) 150 - 440 K/uL    Comment: PLATELET CLUMPS NOTED ON SMEAR, COUNT APPEARS ADEQUATE   Neutrophils Relative % 42 %   Lymphocytes Relative 46 %   Monocytes Relative 6 %   Eosinophils Relative 1 %   Basophils Relative 0 %   Band Neutrophils 1 %   Metamyelocytes Relative 2 %   Myelocytes 2 %   Promyelocytes Relative 0 %   Blasts 0 %   nRBC 0 0 /100 WBC   Other 0 %   Neutro Abs 0.3 (L) 1.4 - 6.5 K/uL   Lymphs Abs 0.3 (L) 1.0 - 3.6 K/uL   Monocytes Absolute 0.0 (L) 0.2 - 1.0 K/uL   Eosinophils Absolute 0.0 0 - 0.7 K/uL   Basophils Absolute 0.0 0 - 0.1 K/uL   RBC Morphology MIXED RBC POPULATION     Comment: Performed at [facility], [address] [place], [place] [address]  Basic metabolic panel     Status: Abnormal   Collection Time: 08/07/17  4:47 AM  Result Value Ref Range   Sodium 138  135 - 145 mmol/L   Potassium 3.1 (L) 3.5 - 5.1 mmol/L   Chloride 113 (H) 98 - 111 mmol/L   CO2 17 (L) 22 - 32 mmol/L   Glucose, Bld 84 70 - 99 mg/dL   BUN 29 (H) 8 - 23 mg/dL   Creatinine, Ser 1.37 (H) 0.61 - 1.24 mg/dL   Calcium 6.3 (LL) 8.9 - 10.3 mg/dL    Comment: CRITICAL RESULT CALLED TO, READ BACK BY  AND VERIFIED WITH SYLVIA FUENTES AT 7829 ON 08/07/17 [place].    GFR calc non Af Amer 47 (L) >60 mL/min   GFR calc Af Amer 54 (L) >60 mL/min    Comment: (NOTE) The eGFR has been calculated using the CKD EPI equation. This calculation has not been validated in all clinical situations. eGFR's persistently <60 mL/min signify possible Chronic Kidney Disease.    Anion gap 8 5 - 15    Comment: Performed at [facility], [address] [place], [place] [address]  Magnesium     Status: Abnormal   Collection Time: 08/07/17  4:47 AM  Result Value Ref Range   Magnesium 1.4 (L) 1.7 - 2.4 mg/dL    Comment: Performed at [facility], [address] [place], [place] [address]  Hepatic function panel     Status: Abnormal   Collection Time: 08/07/17  4:47 AM  Result Value Ref Range   Total Protein 5.1 (L) 6.5 - 8.1 g/dL   Albumin 2.1 (L) 3.5 - 5.0 g/dL   AST 72 (H) 15 - 41 U/L   ALT 38 0 - 44 U/L   Alkaline Phosphatase 51 38 - 126 U/L   Total Bilirubin 0.7 0.3 - 1.2 mg/dL   Bilirubin, Direct 0.4 (H) 0.0 - 0.2 mg/dL   Indirect Bilirubin 0.3 0.3 - 0.9 mg/dL    Comment: Performed at [facility], [address] [place],  [address]  Glucose, capillary     Status: None   Collection Time: 08/07/17  7:41 AM  Result Value Ref Range   Glucose-Capillary 79 70 - 99 mg/dL  Glucose, capillary     Status: Abnormal   Collection Time: 08/07/17 11:48 AM  Result Value Ref Range   Glucose-Capillary 105 (H) 70 - 99 mg/dL  Glucose, capillary     Status: Abnormal   Collection Time: 08/07/17  4:38 PM  Result Value Ref Range   Glucose-Capillary 107 (H) 70 - 99 mg/dL   Dg  Chest 2 View  Result Date: 08/06/2017 CLINICAL DATA:  84 year old male with fever, weakness, chest pain. EXAM: CHEST - 2 VIEW COMPARISON:  Chest radiograph 07/20/2014.  Chest CT 11/12/2016. FINDINGS: Seated AP and lateral views of the chest. Lung volumes and mediastinal contours are stable and within normal limits. Visualized tracheal air column is within normal limits. No pneumothorax, pulmonary edema, pleural effusion or confluent pulmonary opacity. No acute osseous abnormality identified. Negative visible bowel gas pattern. IMPRESSION: No acute cardiopulmonary abnormality. Electronically Signed   By: Genevie Ann M.D.   On: 08/06/2017 14:37   US Renal  Result Date: 08/06/2017 CLINICAL DATA:  Follow-up left-sided hydronephrosis. Currently undergoing treatment for prostate malignancy. EXAM: RENAL / URINARY TRACT ULTRASOUND COMPLETE COMPARISON:  Nuclear bone scan of April 01, 2017 and CT scan of the abdomen and pelvis of the same day. FINDINGS: Right Kidney: Length: 14.5 cm. The renal cortical echotexture remains lower than that of the liver. There is mild hydronephrosis. There is an upper pole simple appearing cyst measuring 1.4 cm in greatest dimension. In the lower pole there is a solid-appearing mixed echogenicity mass measuring 3.8 x 3.3 x 3.6 cm. Left Kidney: Length: 13 cm. There is moderate hydronephrosis. The renal cortical echotexture is similar to that of the right kidney. There is an upper pole exophytic cortical cyst measuring 3.2 x 2.6 x 3.0 cm. Bladder: The urinary bladder is largely decompressed by a Foley catheter. IMPRESSION: Mild right-sided hydronephrosis. Solid-appearing lower pole right renal mass  which measures 3.8 cm in diameter and is similar to that seen on the CT scan in March 2019. Moderate hydronephrosis on the left. Simple appearing upper pole cortical cyst. Electronically Signed   By: David  Martinique M.D.   On: 08/06/2017 14:44    Assessment:  Grafton Warzecha. is a 84 y.o. male  with metastatic prostate cancer currently day 9 s/p cycle #3 cabazitaxel.  He presented with syncope secondary to orthostatic hypotension.    He has hematuria with Foley in place and CBI.  He has chronic anemia exacerbated by hematuria and recent chemotherapy.  He has received 3 units of PRBCs.  He has fever and neutropenia.  Blood and urine cultures are negative.  CXR is negative.  He has diarrhea with stool positive for enteropathogenic E coli.   He is on Cefepime.  Plan:   1.  Oncology:  Day 9 s/p cycle #3 cabazitaxel for castrate resistant metastatic prostate cancer.  He recently completed palliative radiation.  Patient followed by Dr. Grayland Ormond in clinic.  2.  Hematology:  Patient developed neutropenia on day 7 of cycle #1 cabazitaxel (not checked with cycle #2).  Patient has been considered in the outpatient department for [facility].  Granix/Neupogen initiated yesterday (second dose today)..  Neutropenic precautions continue.  Transfuse PRBCs as needed given ongoing hematuria to maintain a hemoglobin 7 - 8.  Agree with holding anticoagulation.  ANC 300.  CBC with diff daily.  3.  Urology:  Bladder irrigation currently on hold.  Abdomen CT on 04/01/2017 revealed an enhancing lesion in the prostate directly invading the left posterior bladder with moderate left sided hydroureteronephrosis.  Renal ultrasound revealed mild right sided hydronephrosis and a stable 3.8 cm solid right renal mass.    4.  Infectious disease:  Fever and neutropenia without source.  Fever curve has improved.  Tmax today was 99.3.  Follow cultures.  Continue Cefepime.    5.  Gastroenterology:  Diarrhea felt secondary to radiation, but stool + enteropathogenic E coli.  Patient on contact isolation.  6.  Code status:  DNR per chart.   Lequita Asal, MD  08/07/2017, 8:32 PM

## 2017-08-08 ENCOUNTER — Inpatient Hospital Stay: Payer: Medicare Other

## 2017-08-08 LAB — TYPE AND SCREEN
ABO/RH(D): A POS
Antibody Screen: NEGATIVE
Unit division: 0
Unit division: 0
Unit division: 0
Unit division: 0

## 2017-08-08 LAB — BPAM RBC
Blood Product Expiration Date: 201908192359
Blood Product Expiration Date: 201908202359
Blood Product Expiration Date: 201908242359
Blood Product Expiration Date: 201908242359
ISSUE DATE / TIME: 201907310941
ISSUE DATE / TIME: 201907311556
Unit Type and Rh: 6200
Unit Type and Rh: 6200
Unit Type and Rh: 6200
Unit Type and Rh: 6200

## 2017-08-08 LAB — GLUCOSE, CAPILLARY
Glucose-Capillary: 87 mg/dL (ref 70–99)
Glucose-Capillary: 116 mg/dL — ABNORMAL HIGH (ref 70–99)
Glucose-Capillary: 118 mg/dL — ABNORMAL HIGH (ref 70–99)
Glucose-Capillary: 112 mg/dL — ABNORMAL HIGH (ref 70–99)

## 2017-08-08 LAB — BASIC METABOLIC PANEL
Anion gap: 6 (ref 5–15)
BUN: 21 mg/dL (ref 8–23)
CHLORIDE: 112 mmol/L — AB (ref 98–111)
CO2: 18 mmol/L — ABNORMAL LOW (ref 22–32)
CREATININE: 1.27 mg/dL — AB (ref 0.61–1.24)
Calcium: 6.3 mg/dL — CL (ref 8.9–10.3)
GFR calc Af Amer: 59 mL/min — ABNORMAL LOW (ref >60)
GFR calc non Af Amer: 51 mL/min — ABNORMAL LOW (ref >60)
GLUCOSE: 93 mg/dL (ref 70–99)
Potassium: 3.3 mmol/L — ABNORMAL LOW (ref 3.5–5.1)
Sodium: 136 mmol/L (ref 135–145)

## 2017-08-08 LAB — CBC WITH DIFFERENTIAL/PLATELET
Basophils Absolute: 0 10*3/uL (ref 0–0.1)
Basophils Relative: 1 %
Eosinophils Absolute: 0 10*3/uL (ref 0–0.7)
Eosinophils Relative: 2 %
HCT: 21.5 % — ABNORMAL LOW (ref 40.0–52.0)
Hemoglobin: 7.4 g/dL — ABNORMAL LOW (ref 13.0–18.0)
Lymphocytes Relative: 37 %
Lymphs Abs: 0.1 10*3/uL — ABNORMAL LOW (ref 1.0–3.6)
MCH: 30.5 pg (ref 26.0–34.0)
MCHC: 34.3 g/dL (ref 32.0–36.0)
MCV: 88.9 fL (ref 80.0–100.0)
Monocytes Absolute: 0.1 10*3/uL — ABNORMAL LOW (ref 0.2–1.0)
Monocytes Relative: 20 %
Neutro Abs: 0.2 10*3/uL — ABNORMAL LOW (ref 1.4–6.5)
Neutrophils Relative %: 40 %
Platelets: 115 10*3/uL — ABNORMAL LOW (ref 150–440)
RBC: 2.42 MIL/uL — ABNORMAL LOW (ref 4.40–5.90)
RDW: 16.3 % — ABNORMAL HIGH (ref 11.5–14.5)
WBC: 0.4 10*3/uL — CL (ref 3.8–10.6)

## 2017-08-08 LAB — HEPATIC FUNCTION PANEL
ALK PHOS: 75 U/L (ref 38–126)
ALT: 69 U/L — AB (ref 0–44)
AST: 126 U/L — ABNORMAL HIGH (ref 15–41)
Albumin: 2.1 g/dL — ABNORMAL LOW (ref 3.5–5.0)
BILIRUBIN INDIRECT: 0.5 mg/dL (ref 0.3–0.9)
Bilirubin, Direct: 0.6 mg/dL — ABNORMAL HIGH (ref 0.0–0.2)
Total Bilirubin: 1.1 mg/dL (ref 0.3–1.2)
Total Protein: 5.1 g/dL — ABNORMAL LOW (ref 6.5–8.1)

## 2017-08-08 LAB — CALCIUM, IONIZED: Calcium, Ionized, Serum: 3.9 mg/dL — ABNORMAL LOW (ref 4.5–5.6)

## 2017-08-08 LAB — MAGNESIUM: Magnesium: 1.9 mg/dL (ref 1.7–2.4)

## 2017-08-08 LAB — PREPARE RBC (CROSSMATCH)

## 2017-08-08 MED ORDER — LORAZEPAM 2 MG/ML IJ SOLN
1.0000 mg | Freq: Once | INTRAMUSCULAR | Status: AC
  Administered 2017-08-08: 18:00:00 1 mg via INTRAVENOUS
  Filled 2017-08-08: qty 1

## 2017-08-08 MED ORDER — PANCRELIPASE (LIP-PROT-AMYL) 12000-38000 UNITS PO CPEP: 72000.0000 [IU] | ORAL_CAPSULE | Freq: Three times a day (TID) | ORAL | Status: DC

## 2017-08-08 MED ORDER — BISACODYL 10 MG RE SUPP
10.0000 mg | Freq: Once | RECTAL | Status: AC
  Administered 2017-08-08: 19:00:00 10 mg via RECTAL
  Filled 2017-08-08: qty 1

## 2017-08-08 MED ORDER — POTASSIUM CHLORIDE CRYS ER 20 MEQ PO TBCR
40.0000 meq | EXTENDED_RELEASE_TABLET | Freq: Once | ORAL | Status: AC
  Administered 2017-08-08: 40 meq via ORAL
  Filled 2017-08-08: qty 2

## 2017-08-08 NOTE — Consult Note (Signed)
Pharmacy Antibiotic Note  Authur Cubit. is a 84 y.o. male admitted on 08/03/2017 with sepsis secondary to UTI. Patient now has febrile neutropenia.  Pharmacy has been consulted for cefepime dosing.   Plan: Continue cefepime 2g IV every 12 hours for crcl 44 ml/min.   Height: 5\' 8"  (172.7 cm) Weight: 180 lb (81.6 kg) IBW/kg (Calculated) : 68.4  Temp (24hrs), Avg:100 F (37.8 C), Min:98.1 F (36.7 C), Max:102.8 F (39.3 C)  Recent Labs  Lab 08/03/17 2241 08/05/17 0341 08/05/17 0722 08/06/17 0415 08/06/17 0844 08/07/17 0447 08/08/17 0449  WBC 11.8* 7.0  --  1.0* 0.8* 0.6* 0.4*  CREATININE 1.66* 1.68*  --  1.53*  --  1.37* 1.27*  LATICACIDVEN  --   --  0.8  --   --   --   --     Estimated Creatinine Clearance: 44.1 mL/min (A) (by C-G formula based on SCr of 1.27 mg/dL (H)).    No Known Allergies  Antimicrobials this admission: 7/30 CTX >> 7/31 7/31 cefepime >>    Microbiology results: 7/31 BCx: pending 7/31 UCx: NG Final  7/31 MRSA PCR: pending  Thank you for allowing pharmacy to be a part of this patient's care.  Noralee Space, PharmD, BCPS Clinical Pharmacist 08/08/2017 2:08 PM

## 2017-08-08 NOTE — Progress Notes (Signed)
Urology Consult Follow Up  Subjective: CBI off yesterday, urine cola tinged but some concern aboutmostly clear.  Catheter obstruction yesterday based on bladder scan, irrigated without difficulty and draining well.  Catheter was irrigated again this morning prior to removal with a scant amount of old small clot.  Removed this a.m.  Anti-infectives: Anti-infectives (From admission, onward)   Start     Dose/Rate Route Frequency Ordered Stop   08/06/17 1215  ceFEPIme (MAXIPIME) 2 g in sodium chloride 0.9 % 100 mL IVPB     2 g 200 mL/hr over 30 Minutes Intravenous Every 12 hours 08/06/17 1203     08/05/17 0700  cefTRIAXone (ROCEPHIN) 1 g in sodium chloride 0.9 % 100 mL IVPB  Status:  Discontinued     1 g 200 mL/hr over 30 Minutes Intravenous Every 24 hours 08/05/17 0647 08/06/17 1157      Current Facility-Administered Medications  Medication Dose Route Frequency Provider Last Rate Last Dose  . 0.9 %  sodium chloride infusion   Intravenous Continuous Hillary Bow, MD 75 mL/hr at 08/08/17 1215    . acetaminophen (TYLENOL) tablet 650 mg  650 mg Oral Q6H PRN Harrie Foreman, MD   650 mg at 08/08/17 4696   Or  . acetaminophen (TYLENOL) suppository 650 mg  650 mg Rectal Q6H PRN Harrie Foreman, MD      . acidophilus (RISAQUAD) capsule 1 capsule  1 capsule Oral Daily Gladstone Lighter, MD   1 capsule at 08/08/17 1212  . ceFEPIme (MAXIPIME) 2 g in sodium chloride 0.9 % 100 mL IVPB  2 g Intravenous Q12H Hallaji, Sheema M, RPH 200 mL/hr at 08/08/17 1217 2 g at 08/08/17 1217  . feeding supplement (ENSURE ENLIVE) (ENSURE ENLIVE) liquid 237 mL  237 mL Oral TID PRN Gladstone Lighter, MD      . HYDROcodone-acetaminophen (NORCO/VICODIN) 5-325 MG per tablet 1 tablet  1 tablet Oral Q4H PRN Harrie Foreman, MD   1 tablet at 08/04/17 0606  . insulin aspart (novoLOG) injection 0-9 Units  0-9 Units Subcutaneous TID WC Hillary Bow, MD   1 Units at 08/05/17 1655  . loperamide (IMODIUM) capsule 2 mg   2 mg Oral Q6H PRN Hillary Bow, MD   2 mg at 08/07/17 2044  . nicotine (NICODERM CQ - dosed in mg/24 hours) patch 21 mg  21 mg Transdermal Daily Harrie Foreman, MD   21 mg at 08/08/17 1224  . omega-3 acid ethyl esters (LOVAZA) capsule 2 g  2 g Oral BID Hillary Bow, MD   2 g at 08/08/17 1211  . ondansetron (ZOFRAN) tablet 4 mg  4 mg Oral Q6H PRN Harrie Foreman, MD       Or  . ondansetron [facility]) injection 4 mg  4 mg Intravenous Q6H PRN Harrie Foreman, MD   4 mg at 08/08/17 1222  . promethazine (PHENERGAN) injection 25 mg  25 mg Intravenous Q8H PRN Harrie Foreman, MD   25 mg at 08/04/17 2952  . sodium chloride irrigation 0.9 % 3,000 mL  3,000 mL Irrigation Continuous Hillary Bow, MD   Stopped at 08/08/17 [id number]  . tamsulosin (FLOMAX) capsule 0.4 mg  0.4 mg Oral Daily Hillary Bow, MD   0.4 mg at 08/08/17 1211  . Tbo-Filgrastim [facility]) injection 480 mcg  480 mcg Subcutaneous q1800 Lequita Asal, MD   480 mcg at 08/07/17 2056   Facility-Administered Medications Ordered in Other Encounters  Medication Dose Route Frequency Provider Last Rate Last  Dose  . ondansetron (ZOFRAN) injection 8 mg  8 mg Intravenous Once Lloyd Huger, MD         Objective: Vital signs in last 24 hours: Temp:  [98.1 F (36.7 C)-102.8 F (39.3 C)] 100.6 F (38.1 C) (08/02 0658) Pulse Rate:  [79-95] 95 (08/02 0541) Resp:  [18-20] 18 (08/02 0541) BP: (112-132)/(60-64) 112/62 (08/02 0541) SpO2:  [97 %-99 %] 97 % (08/02 0541) Weight:  [180 lb (81.6 kg)] 180 lb (81.6 kg) (08/02 0541)  Intake/Output from previous day: 08/01 0701 - 08/02 0700 In: 1600.4 [I.V.:1308.4; IV Piggyback:292] Out: 1285 [Urine:1285] Intake/Output this shift: No intake/output data recorded.   Physical Exam  Alert and oriented Abdomen soft nondistended nontender Hematuria catheter in place draining clear cola tinged urine  Lab Results:  Recent Labs    08/07/17 0447 08/08/17 0449  WBC 0.6* 0.4*  HGB  7.7* 7.4*  HCT 23.0* 21.5*  PLT 128* 115*   BMET Recent Labs    08/07/17 0447 08/08/17 0449  NA 138 136  K 3.1* 3.3*  CL 113* 112*  CO2 17* 18*  GLUCOSE 84 93  BUN 29* 21  CREATININE 1.37* 1.27*  CALCIUM 6.3* 6.3*   CLINICAL DATA:  Follow-up left-sided hydronephrosis. Currently undergoing treatment for prostate malignancy.  EXAM: RENAL / URINARY TRACT ULTRASOUND COMPLETE  COMPARISON:  Nuclear bone scan of April 01, 2017 and CT scan of the abdomen and pelvis of the same day.  FINDINGS: Right Kidney:  Length: 14.5 cm. The renal cortical echotexture remains lower than that of the liver. There is mild hydronephrosis. There is an upper pole simple appearing cyst measuring 1.4 cm in greatest dimension. In the lower pole there is a solid-appearing mixed echogenicity mass measuring 3.8 x 3.3 x 3.6 cm.  Left Kidney:  Length: 13 cm. There is moderate hydronephrosis. The renal cortical echotexture is similar to that of the right kidney. There is an upper pole exophytic cortical cyst measuring 3.2 x 2.6 x 3.0 cm.  Bladder:  The urinary bladder is largely decompressed by a Foley catheter.  IMPRESSION: Mild right-sided hydronephrosis. Solid-appearing lower pole right renal mass which measures 3.8 cm in diameter and is similar to that seen on the CT scan in March 2019.  Moderate hydronephrosis on the left. Simple appearing upper pole cortical cyst.   Electronically Signed   By: David  Martinique M.D.   On: 08/06/2017 14:44    Assessment: 84 year old male with gross hematuria secondary to metastatic castrate resistant prostate cancer hematuria which appears to have subsided.  CBI stopped x24 hours and catheter removed this a.m.  Persistent moderate left and mild right hydronephrosis, creatinine continues to improve.  Plan: -Foley removed this a.m. -Monitor urine output closely, bladder scan not accurate for this patient -If there is concern for urinary  retention or incomplete emptying, he will need a formal pelvic ultrasound to assess his residual -If catheter needs to be replaced, would recommend 18 [place] coud -Patient has elected conservative management for upper tract urinary obstruction which is chronic, declined nephrostomy tubes for the time being unless renal function worsens  Urology will continue to follow closely    LOS: 4 days    Hollice Espy 08/08/2017

## 2017-08-08 NOTE — Progress Notes (Signed)
[facility] NAME: Terry Wood    MR#:  [id number]  DATE OF BIRTH:  [birth year]  SUBJECTIVE:  CHIEF COMPLAINT:   Chief Complaint  Patient presents with  . Diarrhea  . Hematuria  . Loss of Consciousness   - foley catheter removed this AM - pancytopenia persists and low grade fevers again yesterday - poor appetite and chronic diarrhea  REVIEW OF SYSTEMS:  Review of Systems  Constitutional: Positive for malaise/fatigue. Negative for chills and fever.  HENT: Negative for congestion, ear discharge, hearing loss and nosebleeds.   Eyes: Negative for blurred vision and double vision.  Respiratory: Negative for cough, shortness of breath and wheezing.   Cardiovascular: Negative for chest pain and palpitations.  Gastrointestinal: Positive for diarrhea. Negative for abdominal pain, constipation, nausea and vomiting.  Genitourinary: Positive for hematuria. Negative for dysuria.  Musculoskeletal: Positive for myalgias.  Neurological: Negative for dizziness, focal weakness, seizures, weakness and headaches.  Psychiatric/Behavioral: Negative for depression.    DRUG ALLERGIES:  No Known Allergies  VITALS:  Blood pressure 112/62, pulse 95, temperature (!) 100.6 F (38.1 C), temperature source Oral, resp. rate 18, height 5\' 8"  (1.727 m), weight 81.6 kg (180 lb), SpO2 97 %.  PHYSICAL EXAMINATION:  Physical Exam   GENERAL:  84 y.o.-year-old elderly patient lying in the bed with no acute distress.  EYES: Pupils equal, round, reactive to light and accommodation. No scleral icterus. Extraocular muscles intact.  HEENT: Head atraumatic, normocephalic. Oropharynx and nasopharynx clear.  NECK:  Supple, no jugular venous distention. No thyroid enlargement, no tenderness.  LUNGS: Normal breath sounds bilaterally, no wheezing, rales,rhonchi or crepitation. No use of accessory muscles of respiration.  Decreased bibasilar breath  sounds CARDIOVASCULAR: S1, S2 normal. No murmurs, rubs, or gallops.  ABDOMEN: Soft, nontender, minimal discomfort on palpation, nondistended. Bowel sounds present. No organomegaly or mass.  EXTREMITIES: No pedal edema, cyanosis, or clubbing.  NEUROLOGIC: Cranial nerves II through XII are intact. Muscle strength 5/5 in all extremities. Sensation intact. Gait not checked.  PSYCHIATRIC: The patient is alert and oriented x 3.  SKIN: No obvious rash, lesion, or ulcer.    LABORATORY PANEL:   CBC Recent Labs  Lab 08/08/17 0449  WBC 0.4*  HGB 7.4*  HCT 21.5*  PLT 115*   ------------------------------------------------------------------------------------------------------------------  Chemistries  Recent Labs  Lab 08/07/17 0447 08/08/17 0449  NA 138 136  K 3.1* 3.3*  CL 113* 112*  CO2 17* 18*  GLUCOSE 84 93  BUN 29* 21  CREATININE 1.37* 1.27*  CALCIUM 6.3* 6.3*  MG 1.4*  --   AST 72* 126*  ALT 38 69*  ALKPHOS 51 75  BILITOT 0.7 1.1   ------------------------------------------------------------------------------------------------------------------  Cardiac Enzymes Recent Labs  Lab 08/03/17 2241  TROPONINI <0.03   ------------------------------------------------------------------------------------------------------------------  RADIOLOGY:  Dg Chest 2 View  Result Date: 08/06/2017 CLINICAL DATA:  84 year old male with fever, weakness, chest pain. EXAM: CHEST - 2 VIEW COMPARISON:  Chest radiograph 07/20/2014.  Chest CT 11/12/2016. FINDINGS: Seated AP and lateral views of the chest. Lung volumes and mediastinal contours are stable and within normal limits. Visualized tracheal air column is within normal limits. No pneumothorax, pulmonary edema, pleural effusion or confluent pulmonary opacity. No acute osseous abnormality identified. Negative visible bowel gas pattern. IMPRESSION: No acute cardiopulmonary abnormality. Electronically Signed   By: Genevie Ann M.D.   On: 08/06/2017  14:37   US Renal  Result Date: 08/06/2017 CLINICAL DATA:  Follow-up left-sided hydronephrosis.  Currently undergoing treatment for prostate malignancy. EXAM: RENAL / URINARY TRACT ULTRASOUND COMPLETE COMPARISON:  Nuclear bone scan of April 01, 2017 and CT scan of the abdomen and pelvis of the same day. FINDINGS: Right Kidney: Length: 14.5 cm. The renal cortical echotexture remains lower than that of the liver. There is mild hydronephrosis. There is an upper pole simple appearing cyst measuring 1.4 cm in greatest dimension. In the lower pole there is a solid-appearing mixed echogenicity mass measuring 3.8 x 3.3 x 3.6 cm. Left Kidney: Length: 13 cm. There is moderate hydronephrosis. The renal cortical echotexture is similar to that of the right kidney. There is an upper pole exophytic cortical cyst measuring 3.2 x 2.6 x 3.0 cm. Bladder: The urinary bladder is largely decompressed by a Foley catheter. IMPRESSION: Mild right-sided hydronephrosis. Solid-appearing lower pole right renal mass which measures 3.8 cm in diameter and is similar to that seen on the CT scan in March 2019. Moderate hydronephrosis on the left. Simple appearing upper pole cortical cyst. Electronically Signed   By: David  Martinique M.D.   On: 08/06/2017 14:44    EKG:   Orders placed or performed in visit on 08/03/17  . EKG 12-Lead  . EKG 12-Lead  . EKG 12-Lead    ASSESSMENT AND PLAN:   84 year old male with past medical history significant for metastatic prostate cancer, hypertension and diabetes presents to hospital secondary to gross hematuria  1.  Hematuria- also with urinary retention. -Appreciate urology consult. -Held  anticoagulation.   Foley catheter was placed and was on continuous bladder irrigation. -Urine cleared a lot, CBI stopped and Foley catheter removed this morning.  2.  Acute on chronic anemia-hemoglobin drop noted from 7.5 to 5.3 -Received 2 units transfusion on admission.   -Monitor closely and keep  hemoglobin greater than 7  3.  Neutropenic fever-white cells dropped from 7 to 0.4 today -Also received recent chemotherapy -Low-grade fevers.  Cultures negative so far.  on Granix injections.  WBC still at 0.4 -on cefepime for broad-spectrum coverage -Appreciate hematology consult  4.  Metastatic prostate cancer-oncology consulted.  Getting chemotherapy as outpatient.  5.  Left-sided hydronephrosis-asymptomatic hydronephrosis in the past.  Follow-up renal ultrasound with no change in moderate left-sided hydronephrosis.  Also has a right lower pole renal mass. -Follow-up with urology  6.  Poor oral intake-we will consult dietitian.  Concern for chronic diarrhea since his radiation treatment.  on probiotics. -Has hypokalemia and hypomagnesemia.  Being replaced appropriately -Albumin is low-IV albumin ordered.  Corrected calcium is within normal limits  7.  DVT prophylaxis-teds and SCDs  Updated daughter at bedside PT consulted.   All the records are reviewed and case discussed with Care Management/Social Workerr. Management plans discussed with the patient, family and they are in agreement.  CODE STATUS: DNR  TOTAL TIME TAKING CARE OF THIS PATIENT: 36 minutes.   POSSIBLE D/C IN 2-3 DAYS, DEPENDING ON CLINICAL CONDITION.   Gladstone Lighter M.D on 08/08/2017 at 12:37 PM  Between 7am to 6pm - Pager - [phone]  After 6pm go to [email or web address] - password [email or web address] [facility]  Office  [phone]  CC: Primary care physician; System, Pcp Not In

## 2017-08-08 NOTE — Progress Notes (Signed)
Dr. Lovena Neighbours called and notified of [place] result. Per MD, allow patient to void until 2000. If no urine output, place 18 fr coude catheter. In addition, give patient dulcolax suppository. Madlyn Frankel, RN

## 2017-08-08 NOTE — Progress Notes (Signed)
Dr. Erlene Quan paged and notified of limited urinary output this shift and the inability to void at this time. Per Dr. Erlene Quan, place order for limited pelvic US and to notify on-call urologist Dr. Lovena Neighbours of results. Madlyn Frankel, RN

## 2017-08-09 ENCOUNTER — Inpatient Hospital Stay: Payer: Medicare Other

## 2017-08-09 DIAGNOSIS — R338 Other retention of urine: Secondary | ICD-10-CM

## 2017-08-09 DIAGNOSIS — N179 Acute kidney failure, unspecified: Secondary | ICD-10-CM

## 2017-08-09 DIAGNOSIS — N189 Chronic kidney disease, unspecified: Secondary | ICD-10-CM

## 2017-08-09 DIAGNOSIS — N1330 Unspecified hydronephrosis: Secondary | ICD-10-CM

## 2017-08-09 DIAGNOSIS — K56609 Unspecified intestinal obstruction, unspecified as to partial versus complete obstruction: Secondary | ICD-10-CM

## 2017-08-09 LAB — BASIC METABOLIC PANEL
ANION GAP: 7 (ref 5–15)
BUN: 25 mg/dL — AB (ref 8–23)
CHLORIDE: 112 mmol/L — AB (ref 98–111)
CO2: 21 mmol/L — ABNORMAL LOW (ref 22–32)
Calcium: 6.9 mg/dL — ABNORMAL LOW (ref 8.9–10.3)
Creatinine, Ser: 1.62 mg/dL — ABNORMAL HIGH (ref 0.61–1.24)
GFR calc Af Amer: 44 mL/min — ABNORMAL LOW (ref >60)
GFR, EST NON AFRICAN AMERICAN: 38 mL/min — AB (ref >60)
GLUCOSE: 120 mg/dL — AB (ref 70–99)
POTASSIUM: 3.5 mmol/L (ref 3.5–5.1)
Sodium: 140 mmol/L (ref 135–145)

## 2017-08-09 LAB — CBC WITH DIFFERENTIAL/PLATELET
Basophils Absolute: 0 10*3/uL (ref 0–0.1)
Basophils Relative: 0 %
Eosinophils Absolute: 0 10*3/uL (ref 0–0.7)
Eosinophils Relative: 1 %
HCT: 22.5 % — ABNORMAL LOW (ref 40.0–52.0)
Hemoglobin: 7.9 g/dL — ABNORMAL LOW (ref 13.0–18.0)
Lymphocytes Relative: 20 %
Lymphs Abs: 0.3 10*3/uL — ABNORMAL LOW (ref 1.0–3.6)
MCH: 30.7 pg (ref 26.0–34.0)
MCHC: 35.1 g/dL (ref 32.0–36.0)
MCV: 87.3 fL (ref 80.0–100.0)
Monocytes Absolute: 0.3 10*3/uL (ref 0.2–1.0)
Monocytes Relative: 26 %
Neutro Abs: 0.7 10*3/uL — ABNORMAL LOW (ref 1.4–6.5)
Neutrophils Relative %: 53 %
Platelets: 141 10*3/uL — ABNORMAL LOW (ref 150–440)
RBC: 2.58 MIL/uL — ABNORMAL LOW (ref 4.40–5.90)
RDW: 16.8 % — ABNORMAL HIGH (ref 11.5–14.5)
WBC: 1.3 10*3/uL — CL (ref 3.8–10.6)

## 2017-08-09 LAB — GLUCOSE, CAPILLARY
Glucose-Capillary: 101 mg/dL — ABNORMAL HIGH (ref 70–99)
Glucose-Capillary: 103 mg/dL — ABNORMAL HIGH (ref 70–99)
Glucose-Capillary: 99 mg/dL (ref 70–99)
Glucose-Capillary: 78 mg/dL (ref 70–99)

## 2017-08-09 LAB — CALCIUM, IONIZED: Calcium, Ionized, Serum: 4 mg/dL — ABNORMAL LOW (ref 4.5–5.6)

## 2017-08-09 LAB — TYPE AND SCREEN
ABO/RH(D): A POS
Antibody Screen: NEGATIVE

## 2017-08-09 LAB — MAGNESIUM: MAGNESIUM: 2.1 mg/dL (ref 1.7–2.4)

## 2017-08-09 MED ORDER — ONDANSETRON HCL 4 MG/2ML IJ SOLN
4.0000 mg | Freq: Four times a day (QID) | INTRAMUSCULAR | Status: DC
  Administered 2017-08-09 – 2017-08-10 (×4): 4 mg via INTRAVENOUS
  Filled 2017-08-09 (×4): qty 2

## 2017-08-09 MED ORDER — METOCLOPRAMIDE HCL 5 MG/ML IJ SOLN
10.0000 mg | Freq: Once | INTRAMUSCULAR | Status: AC
  Administered 2017-08-09: 08:00:00 10 mg via INTRAVENOUS
  Filled 2017-08-09: qty 2

## 2017-08-09 MED ORDER — ORAL CARE MOUTH RINSE
15.0000 mL | Freq: Two times a day (BID) | OROMUCOSAL | Status: DC
  Administered 2017-08-10 – 2017-08-11 (×4): 15 mL via OROMUCOSAL

## 2017-08-09 MED ORDER — PROMETHAZINE HCL 25 MG/ML IJ SOLN: 12.5000 mg | Freq: Four times a day (QID) | INTRAMUSCULAR | Status: DC | PRN

## 2017-08-09 MED ORDER — PROCHLORPERAZINE EDISYLATE 10 MG/2ML IJ SOLN
10.0000 mg | Freq: Once | INTRAMUSCULAR | Status: AC
  Administered 2017-08-09: 10 mg via INTRAVENOUS
  Filled 2017-08-09: qty 2

## 2017-08-09 MED ORDER — DEXTROSE-NACL 5-0.9 % IV SOLN
INTRAVENOUS | Status: DC
  Administered 2017-08-09 – 2017-08-13 (×6): via INTRAVENOUS

## 2017-08-09 NOTE — Progress Notes (Signed)
Subjective: Foley replaced w/o an issue last night after failed voiding trial.  No complaints this morning.  He is resting comfortably in bed and watching the news.   Objective: Vital signs in last 24 hours: Temp:  [98.5 F (36.9 C)-100.8 F (38.2 C)] 99.1 F (37.3 C) (08/03 0502) Pulse Rate:  [84-100] 84 (08/03 0502) Resp:  [18-20] 18 (08/03 0502) BP: (101-113)/(57-66) 101/58 (08/03 0502) SpO2:  [96 %-97 %] 97 % (08/03 0502) Weight:  [81.9 kg (180 lb 8 oz)] 81.9 kg (180 lb 8 oz) (08/03 0500)  Intake/Output from previous day: 08/02 0701 - 08/03 0700 In: 1768.8 [I.V.:1568.8; IV Piggyback:200] Out: 475 [Urine:475]  Intake/Output this shift: Total I/O In: -  Out: 300 [Emesis/NG output:300]  Physical Exam:  General: Alert and oriented CV: RRR, palpable distal pulses Lungs: CTAB, equal chest rise Abdomen: Soft, NTND, no rebound or guarding Gu: Foley in place and draining amber urine with no clots Ext: NT, No erythema  Lab Results: Recent Labs    08/07/17 0447 08/08/17 0449 08/09/17 0459  HGB 7.7* 7.4* 7.9*  HCT 23.0* 21.5* 22.5*   BMET Recent Labs    08/08/17 0449 08/09/17 0459  NA 136 140  K 3.3* 3.5  CL 112* 112*  CO2 18* 21*  GLUCOSE 93 120*  BUN 21 25*  CREATININE 1.27* 1.62*  CALCIUM 6.3* 6.9*     Studies/Results: Dg Abd 1 View  Result Date: 08/09/2017 CLINICAL DATA:  Sepsis secondary to UTI per chart (admission). Now with vomiting. EXAM: ABDOMEN - 1 VIEW COMPARISON:  None. FINDINGS: Moderately distended gas-filled loops of small bowel throughout the abdomen and upper pelvis. Large bowel appears relatively decompressed. Most prominent small bowel distension measures 5.2 cm. No evidence of soft tissue mass or abnormal fluid collection. No evidence of free intraperitoneal air. No evidence of renal or ureteral calculi. No acute or suspicious osseous finding. IMPRESSION: Findings are consistent with small-bowel obstruction. Moderately distended gas-filled  loops of small bowel are seen throughout the abdomen and upper pelvis, with most prominent small bowel distension measuring 5.2 cm. Large bowel appears relatively decompressed suggesting either high-grade partial small bowel obstruction or early complete obstruction. Recommend immediate surgical consultation if has not already been obtained. These results will be called to the ordering clinician or representative by the Radiologist Assistant, and communication documented in the PACS or zVision Dashboard. Electronically Signed   By: Franki Cabot M.D.   On: 08/09/2017 08:45   US Pelvis Limited (transabdominal Only)  Result Date: 08/08/2017 CLINICAL DATA:  84 year old with urinary retention. EXAM: LIMITED ULTRASOUND OF PELVIS TECHNIQUE: Limited transabdominal ultrasound examination of the pelvis was performed with attention to the urinary bladder. COMPARISON:  Urinary tract ultrasound 08/06/2017 or the bladder was decompressed by Foley catheter. CT abdomen and pelvis 04/01/2017. FINDINGS: Mass involving the LEFT posterolateral wall of the urinary bladder as noted on the recent CT, measuring approximately 4.1 x 1.6 x 1.9 cm. Pre-void volume:  272 ml Post-void volume: Patient was unable to void and therefore a post-void volume was not obtained. Other findings:  None. IMPRESSION: 1. Relatively decompressed urinary bladder with a volume of 272 mL. 2. Patient was not able to void and therefore a post-void volume was not obtained. 3. Mass involving the LEFT posterolateral wall of the urinary bladder, maximum measurement approximately 4 cm, as noted on the recent CT. Electronically Signed   By: Evangeline Dakin M.D.   On: 08/08/2017 18:24    Assessment/Plan: 84 yM with castration resistant metastatic  prostate cancer Urinary retention Bilateral hydronephrosis Acute on chronic renal failure  -Slight bump in creatinine this AM is likely secondary to BOO.  Foley drained ~500 mL after replacement and output as been  adequate. Encouraged hydration.  Recommend keeping the Foley in place for at least 3-5 days to allow bladder rest.  Will repeat RUS if renal function does not improve over the next 24-48 hours.    LOS: 5 days   Ellison Hughs, MD [facility] Pager: [phone]  08/09/2017, 10:02 AM

## 2017-08-09 NOTE — Progress Notes (Signed)
[facility] NAME: Terry Wood    MR#:  [id number]  DATE OF BIRTH:  Jun 06, 1933  SUBJECTIVE:  CHIEF COMPLAINT:   Chief Complaint  Patient presents with  . Diarrhea  . Hematuria  . Loss of Consciousness   -Foley had to be reinserted again for urinary retention, now with dark brown urine -Developed nausea and vomiting since last evening and abdomen is distended  REVIEW OF SYSTEMS:  Review of Systems  Constitutional: Positive for malaise/fatigue. Negative for chills and fever.  HENT: Negative for congestion, ear discharge, hearing loss and nosebleeds.   Eyes: Negative for blurred vision and double vision.  Respiratory: Negative for cough, shortness of breath and wheezing.   Cardiovascular: Negative for chest pain and palpitations.  Gastrointestinal: Positive for abdominal pain, diarrhea, nausea and vomiting. Negative for constipation.  Genitourinary: Positive for hematuria. Negative for dysuria.  Musculoskeletal: Positive for myalgias.  Neurological: Negative for dizziness, focal weakness, seizures, weakness and headaches.  Psychiatric/Behavioral: Negative for depression.    DRUG ALLERGIES:  No Known Allergies  VITALS:  Blood pressure (!) 101/58, pulse 84, temperature 99.1 F (37.3 C), temperature source Axillary, resp. rate 18, height 5\' 8"  (1.727 m), weight 81.9 kg (180 lb 8 oz), SpO2 97 %.  PHYSICAL EXAMINATION:  Physical Exam   GENERAL:  84 y.o.-year-old elderly patient lying in the bed with no acute distress.  EYES: Pupils equal, round, reactive to light and accommodation. No scleral icterus. Extraocular muscles intact.  HEENT: Head atraumatic, normocephalic. Oropharynx and nasopharynx clear.  NECK:  Supple, no jugular venous distention. No thyroid enlargement, no tenderness.  LUNGS: Normal breath sounds bilaterally, no wheezing, rales,rhonchi or crepitation. No use of accessory muscles of respiration.  Decreased  bibasilar breath sounds CARDIOVASCULAR: S1, S2 normal. No murmurs, rubs, or gallops.  ABDOMEN: Abdomen is distended, diffusely tender on palpation, hypoactive bowel sounds present. No organomegaly or mass.  EXTREMITIES: No pedal edema, cyanosis, or clubbing.  NEUROLOGIC: Cranial nerves II through XII are intact. Muscle strength 5/5 in all extremities. Sensation intact. Gait not checked.  Global weakness noted PSYCHIATRIC: The patient is alert and oriented x 3.  SKIN: No obvious rash, lesion, or ulcer.    LABORATORY PANEL:   CBC Recent Labs  Lab 08/09/17 0459  WBC 1.3*  HGB 7.9*  HCT 22.5*  PLT 141*   ------------------------------------------------------------------------------------------------------------------  Chemistries  Recent Labs  Lab 08/08/17 0449 08/09/17 0459  NA 136 140  K 3.3* 3.5  CL 112* 112*  CO2 18* 21*  GLUCOSE 93 120*  BUN 21 25*  CREATININE 1.27* 1.62*  CALCIUM 6.3* 6.9*  MG 1.9 2.1  AST 126*  --   ALT 69*  --   ALKPHOS 75  --   BILITOT 1.1  --    ------------------------------------------------------------------------------------------------------------------  Cardiac Enzymes Recent Labs  Lab 08/03/17 2241  TROPONINI <0.03   ------------------------------------------------------------------------------------------------------------------  RADIOLOGY:  Dg Abd 1 View  Result Date: 08/09/2017 CLINICAL DATA:  Sepsis secondary to UTI per chart (admission). Now with vomiting. EXAM: ABDOMEN - 1 VIEW COMPARISON:  None. FINDINGS: Moderately distended gas-filled loops of small bowel throughout the abdomen and upper pelvis. Large bowel appears relatively decompressed. Most prominent small bowel distension measures 5.2 cm. No evidence of soft tissue mass or abnormal fluid collection. No evidence of free intraperitoneal air. No evidence of renal or ureteral calculi. No acute or suspicious osseous finding. IMPRESSION: Findings are consistent with  small-bowel obstruction. Moderately distended gas-filled loops of  small bowel are seen throughout the abdomen and upper pelvis, with most prominent small bowel distension measuring 5.2 cm. Large bowel appears relatively decompressed suggesting either high-grade partial small bowel obstruction or early complete obstruction. Recommend immediate surgical consultation if has not already been obtained. These results will be called to the ordering clinician or representative by the Radiologist Assistant, and communication documented in the PACS or zVision Dashboard. Electronically Signed   By: Franki Cabot M.D.   On: 08/09/2017 08:45   US Pelvis Limited (transabdominal Only)  Result Date: 08/08/2017 CLINICAL DATA:  84 year old with urinary retention. EXAM: LIMITED ULTRASOUND OF PELVIS TECHNIQUE: Limited transabdominal ultrasound examination of the pelvis was performed with attention to the urinary bladder. COMPARISON:  Urinary tract ultrasound 08/06/2017 or the bladder was decompressed by Foley catheter. CT abdomen and pelvis 04/01/2017. FINDINGS: Mass involving the LEFT posterolateral wall of the urinary bladder as noted on the recent CT, measuring approximately 4.1 x 1.6 x 1.9 cm. Pre-void volume:  272 ml Post-void volume: Patient was unable to void and therefore a post-void volume was not obtained. Other findings:  None. IMPRESSION: 1. Relatively decompressed urinary bladder with a volume of 272 mL. 2. Patient was not able to void and therefore a post-void volume was not obtained. 3. Mass involving the LEFT posterolateral wall of the urinary bladder, maximum measurement approximately 4 cm, as noted on the recent CT. Electronically Signed   By: Evangeline Dakin M.D.   On: 08/08/2017 18:24    EKG:   Orders placed or performed in visit on 08/03/17  . EKG 12-Lead  . EKG 12-Lead  . EKG 12-Lead    ASSESSMENT AND PLAN:   84 year old male with past medical history significant for metastatic prostate cancer,  hypertension and diabetes presents to hospital secondary to gross hematuria  1.  Hematuria- also with urinary retention. -Appreciate urology consult. -Held  anticoagulation.  Received Foley catheter with continuous bladder irrigation, however developed urinary retention once Foley was removed.  Reinserted again and now with dark urine..  2.  Acute on chronic anemia-hemoglobin drop noted from 7.5 to 5.3 -Received 2 units transfusion on admission.   -Monitor closely and keep hemoglobin greater than 7  3.  Neutropenic fever-improving white cells.  ANC of 650 today. -Still having intermittent fevers.  Cultures are negative, remains on cefepime. -Concern for 2 more fevers -Also received recent chemotherapy -Appreciate hematology consult  4.  Metastatic prostate cancer-oncology consulted.  Getting chemotherapy as outpatient.  5.  Left-sided hydronephrosis-asymptomatic hydronephrosis in the past.  Follow-up renal ultrasound with no change in moderate left-sided hydronephrosis.  Also has a right lower pole renal mass. -Follow-up with urology -Patient defers placement of nephrostomy tubes at this time.  6.  Abdominal distention with nausea and vomiting-either ileus or partial small bowel obstruction suspected -KUB ordered -Surgical consult.  We will get an NG tube -We will get repeat CT of the abdomen pelvis to look at tumor burden. -Patient continues to have minimal chronic diarrhea from radiation proctitis  7.  DVT prophylaxis-teds and SCDs  Updated daughter and daughter-in-law at bedside PT consulted.   All the records are reviewed and case discussed with Care Management/Social Workerr. Management plans discussed with the patient, family and they are in agreement.  CODE STATUS: DNR  TOTAL TIME TAKING CARE OF THIS PATIENT: 41 minutes.   POSSIBLE D/C IN 2-3 DAYS, DEPENDING ON CLINICAL CONDITION.   Gladstone Lighter M.D on 08/09/2017 at 12:39 PM  Between 7am to 6pm - Pager -  [phone]  After 6pm go to [email or web address] - password [email or web address] [facility]  Office  [phone]  CC: Primary care physician; System, Pcp Not In

## 2017-08-09 NOTE — Progress Notes (Signed)
[facility] Hematology/Oncology Progress Note  Date of admission: 08/03/2017  Hospital day:  08/09/2017  Chief Complaint: Terry Wood. is a 84 y.o. 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  Subjective:  He feels "worse".  Diarrhea continues.  Now with abdominal distention and emesis.  Hematuria improved with catheter exchange.  Social History: The patient is accompanied by his sons today.  Allergies: No Known Allergies  Scheduled Medications: . acidophilus  1 capsule Oral Daily  . insulin aspart  0-9 Units Subcutaneous TID WC  . nicotine  21 mg Transdermal Daily  . omega-3 acid ethyl esters  2 g Oral BID  . ondansetron (ZOFRAN) IV  4 mg Intravenous Q6H  . tamsulosin  0.4 mg Oral Daily  . Tbo-Filgrastim  480 mcg Subcutaneous q1800    Review of Systems: GENERAL:  Fatigue.  No fevers, sweats or weight loss. PERFORMANCE STATUS (ECOG):  2 HEENT:  No visual changes, runny nose, sore throat, mouth sores or tenderness. Lungs: No shortness of breath or cough.  No hemoptysis. Cardiac:  No chest pain, palpitations, orthopnea, or PND. GI:  Emesis and diarrhea.  No constipation, melena or hematochezia. GU:  Foley in place.  Urine clearing.  No urgency, frequency, or dysuria. Musculoskeletal:  No back pain.  No joint pain.  No muscle tenderness. Extremities:  No pain or swelling. Skin:  No rashes or skin changes. Neuro:  No headache, numbness or weakness, balance or coordination issues. Endocrine:  No diabetes, thyroid issues, hot flashes or night sweats. Psych:  Frustrated.  No mood changes, depression or anxiety. Pain:  No focal pain. Review of systems:  All other systems reviewed and found to be negative.   Physical Exam: Blood pressure (!) 101/58, pulse 84, temperature 99.1 F (37.3 C), temperature source Axillary, resp. rate  18, height _0  (1.727 m), weight 180 lb 8 oz (81.9 kg), SpO2 97 %.  GENERAL:  Well developed, well nourished, gentleman sitting comfortably on the medical unit in no acute distress. MENTAL STATUS:  Alert and oriented to person, place and time. HEAD:  Short gray hair.  Normocephalic, atraumatic, face symmetric, no Cushingoid features. EYES:  Pupils equal round and reactive to light and accomodation.  No conjunctivitis or scleral icterus. ENT:  Oropharynx clear without lesion.  Tongue normal. Mucous membranes moist.  RESPIRATORY:  Clear to auscultation without rales, wheezes or rhonchi. CARDIOVASCULAR:  Regular rate and rhythm without murmur, rub or gallop. ABDOMEN:  Distended, not tense.  Bowel sounds.  Soft, non-tender, and no hepatosplenomegaly.  No masses. No guarding or rebound tenderness. GU:  Foley in place. Urine light coca-cola colored. SKIN:  Right upper extremity dressing in place.  No rashes, ulcers or lesions. EXTREMITIES: No edema, no skin discoloration or tenderness.  No palpable cords. LYMPH NODES: No palpable cervical, supraclavicular, axillary or inguinal adenopathy  NEUROLOGICAL: Unremarkable. PSYCH:  Appropriate.    Results for orders placed or performed during the hospital encounter of 08/03/17 (from the past 48 hour(s))  Calcium, ionized     Status: Abnormal   Collection Time: 08/07/17  2:44 PM  Result Value Ref Range   Calcium, Ionized, Serum 3.9 (L) 4.5 - 5.6 mg/dL    Comment: (NOTE) Performed At: [facility] [address] [place], [place] [id number] Rush Farmer MD WU:[id number]   Glucose, capillary     Status: Abnormal   Collection Time:  08/07/17  4:38 PM  Result Value Ref Range   Glucose-Capillary 107 (H) 70 - 99 mg/dL  Glucose, capillary     Status: Abnormal   Collection Time: 08/07/17  9:19 PM  Result Value Ref Range   Glucose-Capillary 118 (H) 70 - 99 mg/dL  Basic metabolic panel     Status: Abnormal   Collection Time: 08/08/17  4:49 AM   Result Value Ref Range   Sodium 136 135 - 145 mmol/L   Potassium 3.3 (L) 3.5 - 5.1 mmol/L   Chloride 112 (H) 98 - 111 mmol/L   CO2 18 (L) 22 - 32 mmol/L   Glucose, Bld 93 70 - 99 mg/dL   BUN 21 8 - 23 mg/dL   Creatinine, Ser 1.27 (H) 0.61 - 1.24 mg/dL   Calcium 6.3 (LL) 8.9 - 10.3 mg/dL    Comment: CRITICAL RESULT CALLED TO, READ BACK BY AND VERIFIED WITH  JEANNE MUTESI  AT 9211 08/08/17 SDR    GFR calc non Af Amer 51 (L) >60 mL/min   GFR calc Af Amer 59 (L) >60 mL/min    Comment: (NOTE) The eGFR has been calculated using the CKD EPI equation. This calculation has not been validated in all clinical situations. eGFR's persistently <60 mL/min signify possible Chronic Kidney Disease.    Anion gap 6 5 - 15    Comment: Performed at [facility], [address] [place], [place] [address]  CBC with Differential     Status: Abnormal   Collection Time: 08/08/17  4:49 AM  Result Value Ref Range   WBC 0.4 (LL) 3.8 - 10.6 K/uL    Comment: CRITICAL VALUE NOTED.  VALUE IS CONSISTENT WITH PREVIOUSLY REPORTED AND CALLED VALUE.   RBC 2.42 (L) 4.40 - 5.90 MIL/uL   Hemoglobin 7.4 (L) 13.0 - 18.0 g/dL   HCT 21.5 (L) 40.0 - 52.0 %   MCV 88.9 80.0 - 100.0 fL   MCH 30.5 26.0 - 34.0 pg   MCHC 34.3 32.0 - 36.0 g/dL   RDW 16.3 (H) 11.5 - 14.5 %   Platelets 115 (L) 150 - 440 K/uL   Neutrophils Relative % 40 %   Lymphocytes Relative 37 %   Monocytes Relative 20 %   Eosinophils Relative 2 %   Basophils Relative 1 %   Neutro Abs 0.2 (L) 1.4 - 6.5 K/uL   Lymphs Abs 0.1 (L) 1.0 - 3.6 K/uL   Monocytes Absolute 0.1 (L) 0.2 - 1.0 K/uL   Eosinophils Absolute 0.0 0 - 0.7 K/uL   Basophils Absolute 0.0 0 - 0.1 K/uL   RBC Morphology MIXED RBC POPULATION     Comment: ELLIPTOCYTES Performed at [facility], [address] [place], [place] [address]   Hepatic function panel     Status: Abnormal   Collection Time: 08/08/17  4:49 AM  Result Value Ref Range   Total Protein 5.1 (L) 6.5 -  8.1 g/dL   Albumin 2.1 (L) 3.5 - 5.0 g/dL   AST 126 (H) 15 - 41 U/L   ALT 69 (H) 0 - 44 U/L   Alkaline Phosphatase 75 38 - 126 U/L   Total Bilirubin 1.1 0.3 - 1.2 mg/dL   Bilirubin, Direct 0.6 (H) 0.0 - 0.2 mg/dL   Indirect Bilirubin 0.5 0.3 - 0.9 mg/dL    Comment: Performed at [facility], [address] [place], [place] [address]  Calcium, ionized     Status: Abnormal   Collection Time: 08/08/17  4:49  AM  Result Value Ref Range   Calcium, Ionized, Serum 4.0 (L) 4.5 - 5.6 mg/dL    Comment: (NOTE) Performed At: [facility] [place], [place] [id number] Rush Farmer MD LT:[id number]   Magnesium     Status: None   Collection Time: 08/08/17  4:49 AM  Result Value Ref Range   Magnesium 1.9 1.7 - 2.4 mg/dL    Comment: Performed at [facility], [address] [place], [place] [address]  Glucose, capillary     Status: None   Collection Time: 08/08/17  7:47 AM  Result Value Ref Range   Glucose-Capillary 87 70 - 99 mg/dL  Glucose, capillary     Status: Abnormal   Collection Time: 08/08/17 11:58 AM  Result Value Ref Range   Glucose-Capillary 116 (H) 70 - 99 mg/dL  Glucose, capillary     Status: Abnormal   Collection Time: 08/08/17  4:40 PM  Result Value Ref Range   Glucose-Capillary 118 (H) 70 - 99 mg/dL  Glucose, capillary     Status: Abnormal   Collection Time: 08/08/17  9:31 PM  Result Value Ref Range   Glucose-Capillary 112 (H) 70 - 99 mg/dL   Comment 1 Notify RN   CBC with Differential     Status: Abnormal   Collection Time: 08/09/17  4:59 AM  Result Value Ref Range   WBC 1.3 (LL) 3.8 - 10.6 K/uL    Comment: RESULT REPEATED AND VERIFIED CRITICAL VALUE NOTED.  VALUE IS CONSISTENT WITH PREVIOUSLY REPORTED AND CALLED VALUE. TOO FEW TO COUNT, SMEAR AVAILABLE FOR REVIEW    RBC 2.58 (L) 4.40 - 5.90 MIL/uL   Hemoglobin 7.9 (L) 13.0 - 18.0 g/dL   HCT 22.5 (L) 40.0 - 52.0 %   MCV 87.3 80.0 - 100.0 fL   MCH 30.7 26.0 - 34.0 pg    MCHC 35.1 32.0 - 36.0 g/dL   RDW 16.8 (H) 11.5 - 14.5 %   Platelets 141 (L) 150 - 440 K/uL   Neutrophils Relative % 53 %   Neutro Abs 0.7 (L) 1.4 - 6.5 K/uL   Lymphocytes Relative 20 %   Lymphs Abs 0.3 (L) 1.0 - 3.6 K/uL   Monocytes Relative 26 %   Monocytes Absolute 0.3 0.2 - 1.0 K/uL   Eosinophils Relative 1 %   Eosinophils Absolute 0.0 0 - 0.7 K/uL   Basophils Relative 0 %   Basophils Absolute 0.0 0 - 0.1 K/uL    Comment: Performed at [facility], [address] [place], [place] [address]  Basic metabolic panel     Status: Abnormal   Collection Time: 08/09/17  4:59 AM  Result Value Ref Range   Sodium 140 135 - 145 mmol/L   Potassium 3.5 3.5 - 5.1 mmol/L   Chloride 112 (H) 98 - 111 mmol/L   CO2 21 (L) 22 - 32 mmol/L   Glucose, Bld 120 (H) 70 - 99 mg/dL   BUN 25 (H) 8 - 23 mg/dL   Creatinine, Ser 1.62 (H) 0.61 - 1.24 mg/dL   Calcium 6.9 (L) 8.9 - 10.3 mg/dL   GFR calc non Af Amer 38 (L) >60 mL/min   GFR calc Af Amer 44 (L) >60 mL/min    Comment: (NOTE) The eGFR has been calculated using the CKD EPI equation. This calculation has not been validated in all clinical situations. eGFR's persistently <60 mL/min signify possible Chronic Kidney Disease.    Anion gap 7 5 - 15  Comment: Performed at [facility], [address] [place], [place] [address]  Magnesium     Status: None   Collection Time: 08/09/17  4:59 AM  Result Value Ref Range   Magnesium 2.1 1.7 - 2.4 mg/dL    Comment: Performed at [facility], [address] [place], [place] [address]  Type and screen [place]     Status: None   Collection Time: 08/09/17  6:14 AM  Result Value Ref Range   ABO/RH(D) A POS    Antibody Screen NEG    Sample Expiration      08/12/2017 Performed at [facility], [address] [place],  [address]   Glucose, capillary     Status: Abnormal   Collection Time: 08/09/17  7:39 AM  Result Value Ref Range    Glucose-Capillary 101 (H) 70 - 99 mg/dL  Glucose, capillary     Status: Abnormal   Collection Time: 08/09/17 11:52 AM  Result Value Ref Range   Glucose-Capillary 103 (H) 70 - 99 mg/dL   Dg Abd 1 View  Result Date: 08/09/2017 CLINICAL DATA:  Sepsis secondary to UTI per chart (admission). Now with vomiting. EXAM: ABDOMEN - 1 VIEW COMPARISON:  None. FINDINGS: Moderately distended gas-filled loops of small bowel throughout the abdomen and upper pelvis. Large bowel appears relatively decompressed. Most prominent small bowel distension measures 5.2 cm. No evidence of soft tissue mass or abnormal fluid collection. No evidence of free intraperitoneal air. No evidence of renal or ureteral calculi. No acute or suspicious osseous finding. IMPRESSION: Findings are consistent with small-bowel obstruction. Moderately distended gas-filled loops of small bowel are seen throughout the abdomen and upper pelvis, with most prominent small bowel distension measuring 5.2 cm. Large bowel appears relatively decompressed suggesting either high-grade partial small bowel obstruction or early complete obstruction. Recommend immediate surgical consultation if has not already been obtained. These results will be called to the ordering clinician or representative by the Radiologist Assistant, and communication documented in the PACS or zVision Dashboard. Electronically Signed   By: Franki Cabot M.D.   On: 08/09/2017 08:45   US Pelvis Limited (transabdominal Only)  Result Date: 08/08/2017 CLINICAL DATA:  84 year old with urinary retention. EXAM: LIMITED ULTRASOUND OF PELVIS TECHNIQUE: Limited transabdominal ultrasound examination of the pelvis was performed with attention to the urinary bladder. COMPARISON:  Urinary tract ultrasound 08/06/2017 or the bladder was decompressed by Foley catheter. CT abdomen and pelvis 04/01/2017. FINDINGS: Mass involving the LEFT posterolateral wall of the urinary bladder as noted on the recent CT,  measuring approximately 4.1 x 1.6 x 1.9 cm. Pre-void volume:  272 ml Post-void volume: Patient was unable to void and therefore a post-void volume was not obtained. Other findings:  None. IMPRESSION: 1. Relatively decompressed urinary bladder with a volume of 272 mL. 2. Patient was not able to void and therefore a post-void volume was not obtained. 3. Mass involving the LEFT posterolateral wall of the urinary bladder, maximum measurement approximately 4 cm, as noted on the recent CT. Electronically Signed   By: Evangeline Dakin M.D.   On: 08/08/2017 18:24    Assessment:  Terry Wood. is a 84 y.o. male with metastatic prostate cancer currently day 9 s/p cycle #3 cabazitaxel.  He presented with syncope secondary to orthostatic hypotension.    He has hematuria with Foley catheter in place.  Bladder irrigation complete.  He has chronic anemia exacerbated by hematuria and recent chemotherapy.  He has received 2  units of PRBCs.  Urine appears to be clearing.  He has fever and neutropenia.  Counts are improving, day 4 Granix today.  Blood and urine cultures are negative.  CXR is negative.    He has diarrhea with stool positive for enteropathogenic E coli.   He is on Cefepime.  He has new abdominal distention and nausea + vomiting worrisome for ileus or obstruction.  Plan:   1.  Oncology:  Day 49 s/p cycle #3 cabazitaxel for castrate resistant metastatic prostate cancer.  He recently completed palliative radiation.  Patient followed by Dr. Grayland Ormond in clinic.  2.  Hematology:  Patient developed neutropenia on day 8 of cycle #1 cabazitaxel (not checked with cycle #2).  Patient has been considered in the outpatient department for [facility].  Granix/Neupogen initiated on 08/06/2017.  Counts starting to increase today.  Neutropenic precautions continue.  Transfuse PRBCs as needed given ongoing hematuria to maintain a hemoglobin 7 - 8.  Hemoglobin currently 7.9.  Agree with holding anticoagulation.   ANC 700 (improved).  CBC with diff daily.  3.  Urology:  Bladder irrigation on hold.  Foley catheter remains in place.  Abdomen CT on 04/01/2017 revealed an enhancing lesion in the prostate directly invading the left posterior bladder with moderate left sided hydroureteronephrosis.  Renal ultrasound revealed mild right sided hydronephrosis and a stable 3.8 cm solid right renal mass.    4.  Infectious disease:  Fever and neutropenia without source.  Fever 102.8 yesterday at 5:40 AM.  Tmax today is 99.1.  Blood cultures q 24 hours prn temp >= 100.4.  Continue Cefepime.    5.  Gastroenterology:  Diarrhea initially felt secondary to radiation, but stool + enteropathogenic E coli.  Patient on contact isolation.  Ileus/obstructive symptoms today.  Planned NG and repeat abdominal CT.  6.  Code status:  DNR per chart.   Lequita Asal, MD  08/09/2017, 12:14 PM

## 2017-08-09 NOTE — Consult Note (Signed)
Surgical Consultation  08/09/2017  Terry Wood. is an 84 y.o. male.   Referring Physician: Dr. Tressia Miners  CC: Distention  HPI: This patient with known metastatic prostate cancer who just finished radiation therapy last week presents with abdominal distention.  He has had varying studies suggestive of right lower quadrant adhesions.  He has been vomiting.  He has no abdominal pain however.  He states that he spent passing gas and when he passes gas he also has diarrhea.  I spoke to Dr. Tressia Miners personally concerning this patient's care.  He has had a CAT scan today which I had not seen the results of until after seeing the patient.  And nasogastric tube has just been placed as well.  He has had an appendectomy in the past and that is his only abdominal surgery.  Past Medical History:  Diagnosis Date  . Cancer of prostate ([place]) 07/13/2014  . Diabetes mellitus without complication ([place])   . Hypertension     History reviewed. No pertinent surgical history.  History reviewed. No pertinent family history.  No family history of obstructions  Social History:  reports that he has been smoking cigarettes.  He has never used smokeless tobacco. He reports that he drank alcohol. He reports that he does not use drugs.  Allergies: No Known Allergies  Medications reviewed.   Review of Systems:   Review of Systems  Constitutional: Negative for chills and fever.  HENT: Negative.   Eyes: Negative.   Respiratory: Negative.   Cardiovascular: Negative.   Gastrointestinal: Positive for diarrhea, nausea and vomiting. Negative for abdominal pain, blood in stool, constipation, heartburn and melena.  Genitourinary: Negative.   Musculoskeletal: Negative.   Skin: Negative.   Neurological: Negative.   Endo/Heme/Allergies: Negative.   Psychiatric/Behavioral: Negative.      Physical Exam:  BP (!) 101/58 (BP Location: Right Arm)   Pulse 84   Temp 99.1 F (37.3 C) (Axillary)   Resp 18    Ht 5' 8" (1.727 m)   Wt 180 lb 8 oz (81.9 kg)   SpO2 97%   BMI 27.44 kg/m   Physical Exam  Constitutional: He is oriented to person, place, and time. He appears well-developed and well-nourished. No distress.  HENT:  Head: Normocephalic and atraumatic.  Eyes: Pupils are equal, round, and reactive to light. EOM are normal. Right eye exhibits no discharge. Left eye exhibits no discharge. No scleral icterus.  Neck: Normal range of motion. Neck supple.  Cardiovascular: Normal rate and regular rhythm.  Pulmonary/Chest: Effort normal. No respiratory distress.  Abdominal: Soft. He exhibits distension. He exhibits no mass. There is no tenderness. There is no rebound and no guarding.  Genitourinary: Penis normal.  Genitourinary Comments: Foley catheter in place with hematuria  Musculoskeletal: He exhibits no edema or deformity.  Neurological: He is oriented to person, place, and time.  Skin: Skin is warm and dry. No rash noted. He is not diaphoretic. No erythema.  Vitals reviewed.     Results for orders placed or performed during the hospital encounter of 08/03/17 (from the past 48 hour(s))  Calcium, ionized     Status: Abnormal   Collection Time: 08/07/17  2:44 PM  Result Value Ref Range   Calcium, Ionized, Serum 3.9 (L) 4.5 - 5.6 mg/dL    Comment: (NOTE) Performed At: [facility] [place], [place] [id number] Rush Farmer MD QM:[id number]   Glucose, capillary     Status: Abnormal   Collection Time: 08/07/17  4:38  PM  Result Value Ref Range   Glucose-Capillary 107 (H) 70 - 99 mg/dL  Glucose, capillary     Status: Abnormal   Collection Time: 08/07/17  9:19 PM  Result Value Ref Range   Glucose-Capillary 118 (H) 70 - 99 mg/dL  Basic metabolic panel     Status: Abnormal   Collection Time: 08/08/17  4:49 AM  Result Value Ref Range   Sodium 136 135 - 145 mmol/L   Potassium 3.3 (L) 3.5 - 5.1 mmol/L   Chloride 112 (H) 98 - 111 mmol/L   CO2 18 (L) 22 - 32  mmol/L   Glucose, Bld 93 70 - 99 mg/dL   BUN 21 8 - 23 mg/dL   Creatinine, Ser 1.27 (H) 0.61 - 1.24 mg/dL   Calcium 6.3 (LL) 8.9 - 10.3 mg/dL    Comment: CRITICAL RESULT CALLED TO, READ BACK BY AND VERIFIED WITH  JEANNE MUTESI  AT [id number] 08/08/17 SDR    GFR calc non Af Amer 51 (L) >60 mL/min   GFR calc Af Amer 59 (L) >60 mL/min    Comment: (NOTE) The eGFR has been calculated using the CKD EPI equation. This calculation has not been validated in all clinical situations. eGFR's persistently <60 mL/min signify possible Chronic Kidney Disease.    Anion gap 6 5 - 15    Comment: Performed at [facility], [address] [place], [place] [address]  CBC with Differential     Status: Abnormal   Collection Time: 08/08/17  4:49 AM  Result Value Ref Range   WBC 0.4 (LL) 3.8 - 10.6 K/uL    Comment: CRITICAL VALUE NOTED.  VALUE IS CONSISTENT WITH PREVIOUSLY REPORTED AND CALLED VALUE.   RBC 2.42 (L) 4.40 - 5.90 MIL/uL   Hemoglobin 7.4 (L) 13.0 - 18.0 g/dL   HCT 21.5 (L) 40.0 - 52.0 %   MCV 88.9 80.0 - 100.0 fL   MCH 30.5 26.0 - 34.0 pg   MCHC 34.3 32.0 - 36.0 g/dL   RDW 16.3 (H) 11.5 - 14.5 %   Platelets 115 (L) 150 - 440 K/uL   Neutrophils Relative % 40 %   Lymphocytes Relative 37 %   Monocytes Relative 20 %   Eosinophils Relative 2 %   Basophils Relative 1 %   Neutro Abs 0.2 (L) 1.4 - 6.5 K/uL   Lymphs Abs 0.1 (L) 1.0 - 3.6 K/uL   Monocytes Absolute 0.1 (L) 0.2 - 1.0 K/uL   Eosinophils Absolute 0.0 0 - 0.7 K/uL   Basophils Absolute 0.0 0 - 0.1 K/uL   RBC Morphology MIXED RBC POPULATION     Comment: ELLIPTOCYTES Performed at [facility], [address] [place], [place] [address]   Hepatic function panel     Status: Abnormal   Collection Time: 08/08/17  4:49 AM  Result Value Ref Range   Total Protein 5.1 (L) 6.5 - 8.1 g/dL   Albumin 2.1 (L) 3.5 - 5.0 g/dL   AST 126 (H) 15 - 41 U/L   ALT 69 (H) 0 - 44 U/L   Alkaline Phosphatase 75 38 - 126 U/L   Total Bilirubin  1.1 0.3 - 1.2 mg/dL   Bilirubin, Direct 0.6 (H) 0.0 - 0.2 mg/dL   Indirect Bilirubin 0.5 0.3 - 0.9 mg/dL    Comment: Performed at [facility], [address] [place], [place] [address]  Calcium, ionized     Status: Abnormal   Collection Time: 08/08/17  4:49 AM  Result  Value Ref Range   Calcium, Ionized, Serum 4.0 (L) 4.5 - 5.6 mg/dL    Comment: (NOTE) Performed At: [facility] [place], [place] [id number] Rush Farmer MD HF:[id number]   Magnesium     Status: None   Collection Time: 08/08/17  4:49 AM  Result Value Ref Range   Magnesium 1.9 1.7 - 2.4 mg/dL    Comment: Performed at [facility], [address] [place], [place] [address]  Glucose, capillary     Status: None   Collection Time: 08/08/17  7:47 AM  Result Value Ref Range   Glucose-Capillary 87 70 - 99 mg/dL  Glucose, capillary     Status: Abnormal   Collection Time: 08/08/17 11:58 AM  Result Value Ref Range   Glucose-Capillary 116 (H) 70 - 99 mg/dL  Glucose, capillary     Status: Abnormal   Collection Time: 08/08/17  4:40 PM  Result Value Ref Range   Glucose-Capillary 118 (H) 70 - 99 mg/dL  Glucose, capillary     Status: Abnormal   Collection Time: 08/08/17  9:31 PM  Result Value Ref Range   Glucose-Capillary 112 (H) 70 - 99 mg/dL   Comment 1 Notify RN   CBC with Differential     Status: Abnormal   Collection Time: 08/09/17  4:59 AM  Result Value Ref Range   WBC 1.3 (LL) 3.8 - 10.6 K/uL    Comment: RESULT REPEATED AND VERIFIED CRITICAL VALUE NOTED.  VALUE IS CONSISTENT WITH PREVIOUSLY REPORTED AND CALLED VALUE. TOO FEW TO COUNT, SMEAR AVAILABLE FOR REVIEW    RBC 2.58 (L) 4.40 - 5.90 MIL/uL   Hemoglobin 7.9 (L) 13.0 - 18.0 g/dL   HCT 22.5 (L) 40.0 - 52.0 %   MCV 87.3 80.0 - 100.0 fL   MCH 30.7 26.0 - 34.0 pg   MCHC 35.1 32.0 - 36.0 g/dL   RDW 16.8 (H) 11.5 - 14.5 %   Platelets 141 (L) 150 - 440 K/uL   Neutrophils Relative % 53 %   Neutro Abs 0.7 (L) 1.4 - 6.5  K/uL   Lymphocytes Relative 20 %   Lymphs Abs 0.3 (L) 1.0 - 3.6 K/uL   Monocytes Relative 26 %   Monocytes Absolute 0.3 0.2 - 1.0 K/uL   Eosinophils Relative 1 %   Eosinophils Absolute 0.0 0 - 0.7 K/uL   Basophils Relative 0 %   Basophils Absolute 0.0 0 - 0.1 K/uL    Comment: Performed at [facility], [address] [place],  [address]  Basic metabolic panel     Status: Abnormal   Collection Time: 08/09/17  4:59 AM  Result Value Ref Range   Sodium 140 135 - 145 mmol/L   Potassium 3.5 3.5 - 5.1 mmol/L   Chloride 112 (H) 98 - 111 mmol/L   CO2 21 (L) 22 - 32 mmol/L   Glucose, Bld 120 (H) 70 - 99 mg/dL   BUN 25 (H) 8 - 23 mg/dL   Creatinine, Ser 1.62 (H) 0.61 - 1.24 mg/dL   Calcium 6.9 (L) 8.9 - 10.3 mg/dL   GFR calc non Af Amer 38 (L) >60 mL/min   GFR calc Af Amer 44 (L) >60 mL/min    Comment: (NOTE) The eGFR has been calculated using the CKD EPI equation. This calculation has not been validated in all clinical situations. eGFR's persistently <60 mL/min signify possible Chronic Kidney Disease.    Anion gap 7 5 - 15    Comment: Performed at  [facility], [address], [place], [place] [address]  Magnesium     Status: None   Collection Time: 08/09/17  4:59 AM  Result Value Ref Range   Magnesium 2.1 1.7 - 2.4 mg/dL    Comment: Performed at [facility], [address] [place], [place] [address]  Type and screen [place]     Status: None   Collection Time: 08/09/17  6:14 AM  Result Value Ref Range   ABO/RH(D) A POS    Antibody Screen NEG    Sample Expiration      08/12/2017 Performed at [facility], [address] [place], [place] [address]   Glucose, capillary     Status: Abnormal   Collection Time: 08/09/17  7:39 AM  Result Value Ref Range   Glucose-Capillary 101 (H) 70 - 99 mg/dL  Glucose, capillary     Status: Abnormal   Collection Time: 08/09/17 11:52 AM  Result Value Ref Range    Glucose-Capillary 103 (H) 70 - 99 mg/dL   Ct Abdomen Pelvis Wo Contrast  Result Date: 08/09/2017 CLINICAL DATA:  Hematuria. Diarrhea. Symptoms for 1 week. Receiving chemotherapy and radiation for prostate cancer. Question bowel obstruction by plain film today. EXAM: CT ABDOMEN AND PELVIS WITHOUT CONTRAST TECHNIQUE: Multidetector CT imaging of the abdomen and pelvis was performed following the standard protocol without IV contrast. COMPARISON:  08/09/2017 KUB and CT abdomen pelvis 04/01/2017 FINDINGS: Lower chest: There is bibasilar atelectasis. Coronary artery stent or calcification present. Small pericardial effusion. Hepatobiliary: The liver is homogeneous. Layering calcified gallstones. Pancreas: Unremarkable. No pancreatic ductal dilatation or surrounding inflammatory changes. Spleen: Normal in size without focal abnormality. Adrenals/Urinary Tract: Adrenal glands are normal.There is moderate LEFT-sided hydronephrosis. The dilated LEFT ureter extends to the level of the ureterovesical junction. At the ureterovesical junction on the previous study, enhancing mass was identified. There is still mass effect in this region but the lack of intravenous contrast limits evaluation of the mass. There is mild RIGHT-sided hydronephrosis, increased compared with the prior study. Stable appearance pole renal mass, now of solid RIGHT LOWER measuring 3.9 x 3.6 centimeters and previously measuring 3.1 x 3.9 centimeters. Stable LEFT renal cyst measures 3 centimeters. Urinary bladder is decompressed by a Foley catheter. There is heterogeneous lumen of the bladder, consistent with known hematuria. Mass effect of mass in the LEFT ureterovesical junction region is again noted but not as well evaluated without intravenous contrast. Suspect mass measures at least 3.5 centimeters. Stomach/Bowel: The stomach is distended. Small bowel loops are dilated to level of the RIGHT LOWER QUADRANT where normal caliber small bowel loops are  present. The precise point of obstruction is not identified. There is no mass the RIGHT LOWER QUADRANT. The loops of colon are decompressed. Appendix is not seen. Vascular/Lymphatic: There is atherosclerotic calcification of the abdominal aorta. Suspect short-segment dissection or penetrating ulcer in the anterior LOWER aorta, measuring 2.5 centimeters in length and 1.1 centimeters in width. This is favored to represent a chronic process. Reproductive: Prostatic calcifications are present. Other: There is fat within the LEFT inguinal ring. Anterior abdominal wall is notable for small locules of gas from recent presumed subcutaneous injections. There is bilateral minimal gynecomastia. Mild subcutaneous edema. Musculoskeletal: Significant degenerative changes in the mid lumbar spine and LOWER thoracic spine. There are sclerotic lesions associated with fractures involving the RIGHT anterior 10th, 9th, 8th, and 7th ribs. Sclerotic lesion in the LEFT anterior 7th rib. There are new faintly sclerotic lesions involving  L2, the RIGHT and LEFT sacrum, and the RIGHT posterior ilium. IMPRESSION: 1. Interval increase in RIGHT-sided hydronephrosis, likely secondary to known mass within the urinary bladder. 2. Heterogeneous contents of the urinary bladder, consistent with known hematuria and mass in the region of the LEFT ureterovesical junction. This mass is not as well evaluated today on this noncontrast exam. 3. Small bowel obstruction to level of the RIGHT LOWER QUADRANT, possibly related to adhesions. No definite mass identified as a point of obstruction. 4. Solid mass in the LOWER pole the RIGHT kidney, suspicious for renal cell carcinoma. 5. New osseous metastatic disease. 6. Coronary artery disease. 7.  Aortic atherosclerosis.  (ICD10-I70.0) Electronically Signed   By: Nolon Nations M.D.   On: 08/09/2017 12:49   Dg Abd 1 View  Result Date: 08/09/2017 CLINICAL DATA:  Check NGT placement EXAM: ABDOMEN - 1 VIEW  COMPARISON:  None. FINDINGS: Nasogastric catheter is noted within the stomach. Small bowel dilatation is again identified. IMPRESSION: Nasogastric catheter within the stomach. Electronically Signed   By: Inez Catalina M.D.   On: 08/09/2017 14:16   Dg Abd 1 View  Result Date: 08/09/2017 CLINICAL DATA:  Sepsis secondary to UTI per chart (admission). Now with vomiting. EXAM: ABDOMEN - 1 VIEW COMPARISON:  None. FINDINGS: Moderately distended gas-filled loops of small bowel throughout the abdomen and upper pelvis. Large bowel appears relatively decompressed. Most prominent small bowel distension measures 5.2 cm. No evidence of soft tissue mass or abnormal fluid collection. No evidence of free intraperitoneal air. No evidence of renal or ureteral calculi. No acute or suspicious osseous finding. IMPRESSION: Findings are consistent with small-bowel obstruction. Moderately distended gas-filled loops of small bowel are seen throughout the abdomen and upper pelvis, with most prominent small bowel distension measuring 5.2 cm. Large bowel appears relatively decompressed suggesting either high-grade partial small bowel obstruction or early complete obstruction. Recommend immediate surgical consultation if has not already been obtained. These results will be called to the ordering clinician or representative by the Radiologist Assistant, and communication documented in the PACS or zVision Dashboard. Electronically Signed   By: Franki Cabot M.D.   On: 08/09/2017 08:45   US Pelvis Limited (transabdominal Only)  Result Date: 08/08/2017 CLINICAL DATA:  84 year old with urinary retention. EXAM: LIMITED ULTRASOUND OF PELVIS TECHNIQUE: Limited transabdominal ultrasound examination of the pelvis was performed with attention to the urinary bladder. COMPARISON:  Urinary tract ultrasound 08/06/2017 or the bladder was decompressed by Foley catheter. CT abdomen and pelvis 04/01/2017. FINDINGS: Mass involving the LEFT posterolateral wall  of the urinary bladder as noted on the recent CT, measuring approximately 4.1 x 1.6 x 1.9 cm. Pre-void volume:  272 ml Post-void volume: Patient was unable to void and therefore a post-void volume was not obtained. Other findings:  None. IMPRESSION: 1. Relatively decompressed urinary bladder with a volume of 272 mL. 2. Patient was not able to void and therefore a post-void volume was not obtained. 3. Mass involving the LEFT posterolateral wall of the urinary bladder, maximum measurement approximately 4 cm, as noted on the recent CT. Electronically Signed   By: Evangeline Dakin M.D.   On: 08/08/2017 18:24    Assessment/Plan:  Abdominal films and CT scan personally reviewed labs are personally reviewed.  This patient with a possible bowel obstruction versus ileus.  Initially I thought this would be an ileus but later studies have shown more likelihood of this being a bowel obstruction.  The 3 causes most likely in this patient would be adhesions  from his appendectomy, metastatic prostate tumor.  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  My plan would be to agree with Dr. Tressia Miners and placed in the nasogastric tube which is just been placed.  And see if that makes any difference in his distention.  If this is related to either radiation or adhesions it may be reversible without surgery.  However I did discuss the potential for surgery and the potential for this being related to tumor and requiring an ileostomy if we were to operate.  Patient stated that he wanted no surgery under any circumstance.  There were also a number of other findings on the CT scan including possible small aortic dissection, possible renal cell carcinoma, mass in the left hemipelvis likely related to tumor.  This patient reiterated to me that he had no interest in any sort of surgical intervention at this time.  After I met the patient I spoke to the family on the telephone and stated his wishes and my plan for  conservative management in hopes that this is something reversible without surgery.  They were understanding of the plan.  Florene Glen, MD, FACS

## 2017-08-09 NOTE — Progress Notes (Signed)
PT Cancellation Note  Patient Details Name: Terry Wood. MRN: [id number] DOB: 21-Nov-1933   Cancelled Treatment:    Reason Eval/Treat Not Completed: Other (comment);Patient declined, no reason specified  Spoke with patient about benefits of physical therapy, in order to be able to progress or transition to his next potential location. Patient stated he didn't want to go walking and didn't want physical therapy. I told him physical therapy isn't just walking and that we could do things that included laying or sitting in bed to which he replied he didn't want to do anything and that he wanted to talk to his physician and tell them he didn't need or want physical therapy.   Asad Keeven PT, DPT, LAT, ATC  08/09/17  10:39 AM

## 2017-08-10 ENCOUNTER — Inpatient Hospital Stay: Payer: Medicare Other

## 2017-08-10 DIAGNOSIS — A044 Other intestinal Escherichia coli infections: Secondary | ICD-10-CM

## 2017-08-10 LAB — CBC WITH DIFFERENTIAL/PLATELET
Band Neutrophils: 9 %
Basophils Absolute: 0 10*3/uL (ref 0–0.1)
Basophils Relative: 0 %
Blasts: 0 %
Eosinophils Absolute: 0 10*3/uL (ref 0–0.7)
Eosinophils Relative: 0 %
HCT: 23.1 % — ABNORMAL LOW (ref 40.0–52.0)
Hemoglobin: 8 g/dL — ABNORMAL LOW (ref 13.0–18.0)
Lymphocytes Relative: 15 %
Lymphs Abs: 0.7 10*3/uL — ABNORMAL LOW (ref 1.0–3.6)
MCH: 30.6 pg (ref 26.0–34.0)
MCHC: 34.7 g/dL (ref 32.0–36.0)
MCV: 88.4 fL (ref 80.0–100.0)
Metamyelocytes Relative: 3 %
Monocytes Absolute: 0.6 10*3/uL (ref 0.2–1.0)
Monocytes Relative: 13 %
Myelocytes: 0 %
Neutro Abs: 3.5 10*3/uL (ref 1.4–6.5)
Neutrophils Relative %: 60 %
Other: 0 %
Platelets: 169 10*3/uL (ref 150–440)
Promyelocytes Relative: 0 %
RBC: 2.61 MIL/uL — ABNORMAL LOW (ref 4.40–5.90)
RDW: 17 % — ABNORMAL HIGH (ref 11.5–14.5)
WBC: 4.8 10*3/uL (ref 3.8–10.6)
nRBC: 0 /100 WBC

## 2017-08-10 LAB — BASIC METABOLIC PANEL
ANION GAP: 8 (ref 5–15)
BUN: 32 mg/dL — ABNORMAL HIGH (ref 8–23)
CALCIUM: 6.7 mg/dL — AB (ref 8.9–10.3)
CO2: 22 mmol/L (ref 22–32)
Chloride: 113 mmol/L — ABNORMAL HIGH (ref 98–111)
Creatinine, Ser: 1.64 mg/dL — ABNORMAL HIGH (ref 0.61–1.24)
GFR calc Af Amer: 44 mL/min — ABNORMAL LOW (ref >60)
GFR, EST NON AFRICAN AMERICAN: 38 mL/min — AB (ref >60)
Glucose, Bld: 102 mg/dL — ABNORMAL HIGH (ref 70–99)
Potassium: 3 mmol/L — ABNORMAL LOW (ref 3.5–5.1)
Sodium: 143 mmol/L (ref 135–145)

## 2017-08-10 LAB — CULTURE, BLOOD (ROUTINE X 2)
CULTURE: NO GROWTH
CULTURE: NO GROWTH

## 2017-08-10 LAB — GLUCOSE, CAPILLARY
Glucose-Capillary: 96 mg/dL (ref 70–99)
Glucose-Capillary: 90 mg/dL (ref 70–99)
Glucose-Capillary: 106 mg/dL — ABNORMAL HIGH (ref 70–99)
Glucose-Capillary: 105 mg/dL — ABNORMAL HIGH (ref 70–99)

## 2017-08-10 MED ORDER — POTASSIUM CHLORIDE 10 MEQ/100ML IV SOLN
10.0000 meq | INTRAVENOUS | Status: AC
  Administered 2017-08-10 (×4): 10 meq via INTRAVENOUS
  Filled 2017-08-10 (×4): qty 100

## 2017-08-10 NOTE — Progress Notes (Signed)
PT Cancellation Note  Patient Details Name: Terry Wood. MRN: [id number] DOB: [birth year]   Cancelled Treatment:    Reason Eval/Treat Not Completed: Patient declined, no reason specified   Alanson Puls, PT DPT 08/10/2017, 3:43 PM

## 2017-08-10 NOTE — Progress Notes (Signed)
CC: Small bowel obstruction Subjective: This patient with a small bowel obstruction.  He has known metastatic prostate cancer and multiple other abnormalities on his CT scan suggestive possible renal cell carcinoma as well.  He presents with bowel obstruction.  He has no further vomiting now that the nasogastric tube is present.  He also has passed some gas and had a bowel movement late yesterday afternoon which was loose.  Denies fevers or chills but overall feels better.  Objective: Vital signs in last 24 hours: Temp:  [98 F (36.7 C)-98.2 F (36.8 C)] 98.1 F (36.7 C) (08/04 0451) Pulse Rate:  [89-91] 89 (08/04 0451) Resp:  [18] 18 (08/04 0451) BP: (101-129)/(49-78) 101/78 (08/04 0451) SpO2:  [97 %-99 %] 97 % (08/04 0451) Weight:  [186 lb 11.7 oz (84.7 kg)] 186 lb 11.7 oz (84.7 kg) (08/04 0451) Last BM Date: 08/09/17  Intake/Output from previous day: 08/03 0701 - 08/04 0700 In: 2125 [I.V.:2025; IV Piggyback:100] Out: 4001 [Urine:650; Emesis/NG output:3350; Stool:1] Intake/Output this shift: No intake/output data recorded.  Physical exam:  Distended less tympanitic nontender abdomen nasogastric tube in place.  Nontender calves awake alert and oriented vital signs are stable  Lab Results: CBC  Recent Labs    08/09/17 0459 08/10/17 0446  WBC 1.3* 4.8  HGB 7.9* 8.0*  HCT 22.5* 23.1*  PLT 141* 169   BMET Recent Labs    08/09/17 0459 08/10/17 0446  NA 140 143  K 3.5 3.0*  CL 112* 113*  CO2 21* 22  GLUCOSE 120* 102*  BUN 25* 32*  CREATININE 1.62* 1.64*  CALCIUM 6.9* 6.7*   PT/INR No results for input(s): LABPROT, INR in the last 72 hours. ABG No results for input(s): PHART, HCO3 in the last 72 hours.  Invalid input(s): PCO2, PO2  Studies/Results: Ct Abdomen Pelvis Wo Contrast  Result Date: 08/09/2017 CLINICAL DATA:  Hematuria. Diarrhea. Symptoms for 1 week. Receiving chemotherapy and radiation for prostate cancer. Question bowel obstruction by plain film  today. EXAM: CT ABDOMEN AND PELVIS WITHOUT CONTRAST TECHNIQUE: Multidetector CT imaging of the abdomen and pelvis was performed following the standard protocol without IV contrast. COMPARISON:  08/09/2017 KUB and CT abdomen pelvis 04/01/2017 FINDINGS: Lower chest: There is bibasilar atelectasis. Coronary artery stent or calcification present. Small pericardial effusion. Hepatobiliary: The liver is homogeneous. Layering calcified gallstones. Pancreas: Unremarkable. No pancreatic ductal dilatation or surrounding inflammatory changes. Spleen: Normal in size without focal abnormality. Adrenals/Urinary Tract: Adrenal glands are normal.There is moderate LEFT-sided hydronephrosis. The dilated LEFT ureter extends to the level of the ureterovesical junction. At the ureterovesical junction on the previous study, enhancing mass was identified. There is still mass effect in this region but the lack of intravenous contrast limits evaluation of the mass. There is mild RIGHT-sided hydronephrosis, increased compared with the prior study. Stable appearance pole renal mass, now of solid RIGHT LOWER measuring 3.9 x 3.6 centimeters and previously measuring 3.1 x 3.9 centimeters. Stable LEFT renal cyst measures 3 centimeters. Urinary bladder is decompressed by a Foley catheter. There is heterogeneous lumen of the bladder, consistent with known hematuria. Mass effect of mass in the LEFT ureterovesical junction region is again noted but not as well evaluated without intravenous contrast. Suspect mass measures at least 3.5 centimeters. Stomach/Bowel: The stomach is distended. Small bowel loops are dilated to level of the RIGHT LOWER QUADRANT where normal caliber small bowel loops are present. The precise point of obstruction is not identified. There is no mass the RIGHT LOWER QUADRANT. The loops  of colon are decompressed. Appendix is not seen. Vascular/Lymphatic: There is atherosclerotic calcification of the abdominal aorta. Suspect  short-segment dissection or penetrating ulcer in the anterior LOWER aorta, measuring 2.5 centimeters in length and 1.1 centimeters in width. This is favored to represent a chronic process. Reproductive: Prostatic calcifications are present. Other: There is fat within the LEFT inguinal ring. Anterior abdominal wall is notable for small locules of gas from recent presumed subcutaneous injections. There is bilateral minimal gynecomastia. Mild subcutaneous edema. Musculoskeletal: Significant degenerative changes in the mid lumbar spine and LOWER thoracic spine. There are sclerotic lesions associated with fractures involving the RIGHT anterior 10th, 9th, 8th, and 7th ribs. Sclerotic lesion in the LEFT anterior 7th rib. There are new faintly sclerotic lesions involving L2, the RIGHT and LEFT sacrum, and the RIGHT posterior ilium. IMPRESSION: 1. Interval increase in RIGHT-sided hydronephrosis, likely secondary to known mass within the urinary bladder. 2. Heterogeneous contents of the urinary bladder, consistent with known hematuria and mass in the region of the LEFT ureterovesical junction. This mass is not as well evaluated today on this noncontrast exam. 3. Small bowel obstruction to level of the RIGHT LOWER QUADRANT, possibly related to adhesions. No definite mass identified as a point of obstruction. 4. Solid mass in the LOWER pole the RIGHT kidney, suspicious for renal cell carcinoma. 5. New osseous metastatic disease. 6. Coronary artery disease. 7.  Aortic atherosclerosis.  (ICD10-I70.0) Electronically Signed   By: Nolon Nations M.D.   On: 08/09/2017 12:49   Dg Abd 1 View  Result Date: 08/10/2017 CLINICAL DATA:  Small bowel obstruction. EXAM: ABDOMEN - 1 VIEW COMPARISON:  08/09/2017 FINDINGS: An enteric tube terminates in the region of the proximal gastric body. There is persistent diffuse small bowel dilatation which has not significantly changed. The visualized lung bases are grossly clear. No acute osseous  abnormality is identified. IMPRESSION: Unchanged small bowel dilatation consistent with obstruction. Electronically Signed   By: Logan Bores M.D.   On: 08/10/2017 08:52   Dg Abd 1 View  Result Date: 08/09/2017 CLINICAL DATA:  Check NGT placement EXAM: ABDOMEN - 1 VIEW COMPARISON:  None. FINDINGS: Nasogastric catheter is noted within the stomach. Small bowel dilatation is again identified. IMPRESSION: Nasogastric catheter within the stomach. Electronically Signed   By: Inez Catalina M.D.   On: 08/09/2017 14:16   Dg Abd 1 View  Result Date: 08/09/2017 CLINICAL DATA:  Sepsis secondary to UTI per chart (admission). Now with vomiting. EXAM: ABDOMEN - 1 VIEW COMPARISON:  None. FINDINGS: Moderately distended gas-filled loops of small bowel throughout the abdomen and upper pelvis. Large bowel appears relatively decompressed. Most prominent small bowel distension measures 5.2 cm. No evidence of soft tissue mass or abnormal fluid collection. No evidence of free intraperitoneal air. No evidence of renal or ureteral calculi. No acute or suspicious osseous finding. IMPRESSION: Findings are consistent with small-bowel obstruction. Moderately distended gas-filled loops of small bowel are seen throughout the abdomen and upper pelvis, with most prominent small bowel distension measuring 5.2 cm. Large bowel appears relatively decompressed suggesting either high-grade partial small bowel obstruction or early complete obstruction. Recommend immediate surgical consultation if has not already been obtained. These results will be called to the ordering clinician or representative by the Radiologist Assistant, and communication documented in the PACS or zVision Dashboard. Electronically Signed   By: Franki Cabot M.D.   On: 08/09/2017 08:45   US Pelvis Limited (transabdominal Only)  Result Date: 08/08/2017 CLINICAL DATA:  84 year old with urinary retention.  EXAM: LIMITED ULTRASOUND OF PELVIS TECHNIQUE: Limited transabdominal  ultrasound examination of the pelvis was performed with attention to the urinary bladder. COMPARISON:  Urinary tract ultrasound 08/06/2017 or the bladder was decompressed by Foley catheter. CT abdomen and pelvis 04/01/2017. FINDINGS: Mass involving the LEFT posterolateral wall of the urinary bladder as noted on the recent CT, measuring approximately 4.1 x 1.6 x 1.9 cm. Pre-void volume:  272 ml Post-void volume: Patient was unable to void and therefore a post-void volume was not obtained. Other findings:  None. IMPRESSION: 1. Relatively decompressed urinary bladder with a volume of 272 mL. 2. Patient was not able to void and therefore a post-void volume was not obtained. 3. Mass involving the LEFT posterolateral wall of the urinary bladder, maximum measurement approximately 4 cm, as noted on the recent CT. Electronically Signed   By: Evangeline Dakin M.D.   On: 08/08/2017 18:24    Anti-infectives: Anti-infectives (From admission, onward)   Start     Dose/Rate Route Frequency Ordered Stop   08/06/17 1215  ceFEPIme (MAXIPIME) 2 g in sodium chloride 0.9 % 100 mL IVPB     2 g 200 mL/hr over 30 Minutes Intravenous Every 12 hours 08/06/17 1203     08/05/17 0700  cefTRIAXone (ROCEPHIN) 1 g in sodium chloride 0.9 % 100 mL IVPB  Status:  Discontinued     1 g 200 mL/hr over 30 Minutes Intravenous Every 24 hours 08/05/17 0647 08/06/17 1157      Assessment/Plan:  Abdominal films personally reviewed showing some gas in the colon and rectum but largely dilated bowel.  Patient's I's and O's are largely negative and may need additional volume.  I reviewed with the patient and then with his daughter the plans as discussed yesterday both in person and by telephone with other family members.  The 3 possible etiologies in all likelihood are related to either scar tissue from his prior appendectomy, radiation enteritis, or tumor.  As discussed adhesions and radiation may improve with NG suction only.  However if this  is tumor it may not resolve.  I discussed the rationale for waiting on any surgical intervention at this point to see if this improves.  If it does not improve surgical intervention may be required and the potential for finding tumor as the source was discussed.  This would likely result in the need for an ileostomy.  Will defer any decision making on surgery with time based on the patient's clinical picture and response to continue nasogastric suction.  I offered to talk to the patient's daughter-in-law who happens to be an oncology nurse on the oncology floor with a night shift coming up she can call me tonight.  Florene Glen, MD, FACS  08/10/2017

## 2017-08-10 NOTE — Progress Notes (Signed)
[place] [facility] [place] NAME: Terry Wood    MR#:  [id number]  DATE OF BIRTH:  12/23/1933  SUBJECTIVE:  CHIEF COMPLAINT:   Chief Complaint  Patient presents with  . Diarrhea  . Hematuria  . Loss of Consciousness   -More alert today.  Hematuria and pancytopenia have resolved.  Has a Foley catheter in -NG tube placed for obstruction, almost 3000 mL drained out since insertion - no nausea/vomiting  REVIEW OF SYSTEMS:  Review of Systems  Constitutional: Positive for malaise/fatigue. Negative for chills and fever.  HENT: Negative for congestion, ear discharge, hearing loss and nosebleeds.   Eyes: Negative for blurred vision and double vision.  Respiratory: Negative for cough, shortness of breath and wheezing.   Cardiovascular: Negative for chest pain and palpitations.  Gastrointestinal: Positive for abdominal pain, diarrhea, nausea and vomiting. Negative for constipation.  Genitourinary: Negative for dysuria and hematuria.  Musculoskeletal: Positive for myalgias.  Neurological: Negative for dizziness, focal weakness, seizures, weakness and headaches.  Psychiatric/Behavioral: Negative for depression.    DRUG ALLERGIES:  No Known Allergies  VITALS:  Blood pressure 101/78, pulse 89, temperature 98.1 F (36.7 C), temperature source Oral, resp. rate 18, height 5\' 8"  (1.727 m), weight 84.7 kg (186 lb 11.7 oz), SpO2 97 %.  PHYSICAL EXAMINATION:  Physical Exam   GENERAL:  84 y.o.-year-old elderly patient lying in the bed with no acute distress.  EYES: Pupils equal, round, reactive to light and accommodation. No scleral icterus. Extraocular muscles intact.  HEENT: Head atraumatic, normocephalic. Oropharynx and nasopharynx clear.  NECK:  Supple, no jugular venous distention. No thyroid enlargement, no tenderness.  LUNGS: Normal breath sounds bilaterally, no wheezing, rales,rhonchi or crepitation. No use of accessory muscles of respiration.   Decreased bibasilar breath sounds CARDIOVASCULAR: S1, S2 normal. No murmurs, rubs, or gallops.  ABDOMEN: Abdomen is less distended, but still no bowel sounds on auscultation, non tender today. No organomegaly or mass.  EXTREMITIES: No pedal edema, cyanosis, or clubbing.  NEUROLOGIC: Cranial nerves II through XII are intact. Muscle strength 5/5 in all extremities. Sensation intact. Gait not checked.  Global weakness noted PSYCHIATRIC: The patient is alert and oriented x 3.  SKIN: No obvious rash, lesion, or ulcer.    LABORATORY PANEL:   CBC Recent Labs  Lab 08/10/17 0446  WBC 4.8  HGB 8.0*  HCT 23.1*  PLT 169   ------------------------------------------------------------------------------------------------------------------  Chemistries  Recent Labs  Lab 08/08/17 0449 08/09/17 0459 08/10/17 0446  NA 136 140 143  K 3.3* 3.5 3.0*  CL 112* 112* 113*  CO2 18* 21* 22  GLUCOSE 93 120* 102*  BUN 21 25* 32*  CREATININE 1.27* 1.62* 1.64*  CALCIUM 6.3* 6.9* 6.7*  MG 1.9 2.1  --   AST 126*  --   --   ALT 69*  --   --   ALKPHOS 75  --   --   BILITOT 1.1  --   --    ------------------------------------------------------------------------------------------------------------------  Cardiac Enzymes Recent Labs  Lab 08/03/17 2241  TROPONINI <0.03   ------------------------------------------------------------------------------------------------------------------  RADIOLOGY:  Ct Abdomen Pelvis Wo Contrast  Result Date: 08/09/2017 CLINICAL DATA:  Hematuria. Diarrhea. Symptoms for 1 week. Receiving chemotherapy and radiation for prostate cancer. Question bowel obstruction by plain film today. EXAM: CT ABDOMEN AND PELVIS WITHOUT CONTRAST TECHNIQUE: Multidetector CT imaging of the abdomen and pelvis was performed following the standard protocol without IV contrast. COMPARISON:  08/09/2017 KUB and CT abdomen pelvis 04/01/2017 FINDINGS:  Lower chest: There is bibasilar atelectasis.  Coronary artery stent or calcification present. Small pericardial effusion. Hepatobiliary: The liver is homogeneous. Layering calcified gallstones. Pancreas: Unremarkable. No pancreatic ductal dilatation or surrounding inflammatory changes. Spleen: Normal in size without focal abnormality. Adrenals/Urinary Tract: Adrenal glands are normal.There is moderate LEFT-sided hydronephrosis. The dilated LEFT ureter extends to the level of the ureterovesical junction. At the ureterovesical junction on the previous study, enhancing mass was identified. There is still mass effect in this region but the lack of intravenous contrast limits evaluation of the mass. There is mild RIGHT-sided hydronephrosis, increased compared with the prior study. Stable appearance pole renal mass, now of solid RIGHT LOWER measuring 3.9 x 3.6 centimeters and previously measuring 3.1 x 3.9 centimeters. Stable LEFT renal cyst measures 3 centimeters. Urinary bladder is decompressed by a Foley catheter. There is heterogeneous lumen of the bladder, consistent with known hematuria. Mass effect of mass in the LEFT ureterovesical junction region is again noted but not as well evaluated without intravenous contrast. Suspect mass measures at least 3.5 centimeters. Stomach/Bowel: The stomach is distended. Small bowel loops are dilated to level of the RIGHT LOWER QUADRANT where normal caliber small bowel loops are present. The precise point of obstruction is not identified. There is no mass the RIGHT LOWER QUADRANT. The loops of colon are decompressed. Appendix is not seen. Vascular/Lymphatic: There is atherosclerotic calcification of the abdominal aorta. Suspect short-segment dissection or penetrating ulcer in the anterior LOWER aorta, measuring 2.5 centimeters in length and 1.1 centimeters in width. This is favored to represent a chronic process. Reproductive: Prostatic calcifications are present. Other: There is fat within the LEFT inguinal ring. Anterior  abdominal wall is notable for small locules of gas from recent presumed subcutaneous injections. There is bilateral minimal gynecomastia. Mild subcutaneous edema. Musculoskeletal: Significant degenerative changes in the mid lumbar spine and LOWER thoracic spine. There are sclerotic lesions associated with fractures involving the RIGHT anterior 10th, 9th, 8th, and 7th ribs. Sclerotic lesion in the LEFT anterior 7th rib. There are new faintly sclerotic lesions involving L2, the RIGHT and LEFT sacrum, and the RIGHT posterior ilium. IMPRESSION: 1. Interval increase in RIGHT-sided hydronephrosis, likely secondary to known mass within the urinary bladder. 2. Heterogeneous contents of the urinary bladder, consistent with known hematuria and mass in the region of the LEFT ureterovesical junction. This mass is not as well evaluated today on this noncontrast exam. 3. Small bowel obstruction to level of the RIGHT LOWER QUADRANT, possibly related to adhesions. No definite mass identified as a point of obstruction. 4. Solid mass in the LOWER pole the RIGHT kidney, suspicious for renal cell carcinoma. 5. New osseous metastatic disease. 6. Coronary artery disease. 7.  Aortic atherosclerosis.  (ICD10-I70.0) Electronically Signed   By: Nolon Nations M.D.   On: 08/09/2017 12:49   Dg Abd 1 View  Result Date: 08/10/2017 CLINICAL DATA:  Small bowel obstruction. EXAM: ABDOMEN - 1 VIEW COMPARISON:  08/09/2017 FINDINGS: An enteric tube terminates in the region of the proximal gastric body. There is persistent diffuse small bowel dilatation which has not significantly changed. The visualized lung bases are grossly clear. No acute osseous abnormality is identified. IMPRESSION: Unchanged small bowel dilatation consistent with obstruction. Electronically Signed   By: Logan Bores M.D.   On: 08/10/2017 08:52   Dg Abd 1 View  Result Date: 08/09/2017 CLINICAL DATA:  Check NGT placement EXAM: ABDOMEN - 1 VIEW COMPARISON:  None. FINDINGS:  Nasogastric catheter is noted within the stomach. Small bowel  dilatation is again identified. IMPRESSION: Nasogastric catheter within the stomach. Electronically Signed   By: Inez Catalina M.D.   On: 08/09/2017 14:16   Dg Abd 1 View  Result Date: 08/09/2017 CLINICAL DATA:  Sepsis secondary to UTI per chart (admission). Now with vomiting. EXAM: ABDOMEN - 1 VIEW COMPARISON:  None. FINDINGS: Moderately distended gas-filled loops of small bowel throughout the abdomen and upper pelvis. Large bowel appears relatively decompressed. Most prominent small bowel distension measures 5.2 cm. No evidence of soft tissue mass or abnormal fluid collection. No evidence of free intraperitoneal air. No evidence of renal or ureteral calculi. No acute or suspicious osseous finding. IMPRESSION: Findings are consistent with small-bowel obstruction. Moderately distended gas-filled loops of small bowel are seen throughout the abdomen and upper pelvis, with most prominent small bowel distension measuring 5.2 cm. Large bowel appears relatively decompressed suggesting either high-grade partial small bowel obstruction or early complete obstruction. Recommend immediate surgical consultation if has not already been obtained. These results will be called to the ordering clinician or representative by the Radiologist Assistant, and communication documented in the PACS or zVision Dashboard. Electronically Signed   By: Franki Cabot M.D.   On: 08/09/2017 08:45   US Pelvis Limited (transabdominal Only)  Result Date: 08/08/2017 CLINICAL DATA:  83 year old with urinary retention. EXAM: LIMITED ULTRASOUND OF PELVIS TECHNIQUE: Limited transabdominal ultrasound examination of the pelvis was performed with attention to the urinary bladder. COMPARISON:  Urinary tract ultrasound 08/06/2017 or the bladder was decompressed by Foley catheter. CT abdomen and pelvis 04/01/2017. FINDINGS: Mass involving the LEFT posterolateral wall of the urinary bladder as  noted on the recent CT, measuring approximately 4.1 x 1.6 x 1.9 cm. Pre-void volume:  272 ml Post-void volume: Patient was unable to void and therefore a post-void volume was not obtained. Other findings:  None. IMPRESSION: 1. Relatively decompressed urinary bladder with a volume of 272 mL. 2. Patient was not able to void and therefore a post-void volume was not obtained. 3. Mass involving the LEFT posterolateral wall of the urinary bladder, maximum measurement approximately 4 cm, as noted on the recent CT. Electronically Signed   By: Evangeline Dakin M.D.   On: 08/08/2017 18:24    EKG:   Orders placed or performed during the hospital encounter of 08/03/17  . EKG 12-Lead  . EKG 12-Lead    ASSESSMENT AND PLAN:   84 year old male with past medical history significant for metastatic prostate cancer, hypertension and diabetes presents to hospital secondary to gross hematuria  1.  Hematuria- also with urinary retention. -Appreciate urology consult. -off anticoagulation.  Had Foley catheter with continuous bladder irrigation, however developed urinary retention once Foley was removed.  Reinserted again and now with amber-colored urine.. -Urology recommended leaving the catheter in for now  2.  Acute on chronic anemia-hemoglobin drop noted from 7.5 to 5.3 -Received 2 units transfusion on admission.  Hemoglobin at 8 today -Monitor closely and keep hemoglobin greater than 7  3.  Neutropenic fever-neutropenia is resolving. -Improving white cells.  ANC of almost 3000 now. -afebrile.  Cultures are negative, on cefepime. Stop after 7 days -Also received recent chemotherapy.  Diarrhea with enteropathogenic E. coli on GI panel -Appreciate hematology consult  4.  Metastatic prostate cancer-oncology consulted.  Getting chemotherapy as outpatient.  5.  Left-sided hydronephrosis-asymptomatic hydronephrosis in the past.  Follow-up renal ultrasound with no change in moderate left-sided hydronephrosis.   Also has a right lower pole renal mass. -Follow-up with urology -Patient defers placement of nephrostomy  tubes at this time. -Renal function worsening, CT of the abdomen also shows increasing right-sided hydronephrosis-he will need a nephrostomy tube placed  6.  Abdominal distention with nausea and vomiting- secondary to bowel obstruction in RLQ- due to adhesions -KUB f/u with no improvement today -Appreciate surgical consult.  CT of the abdomen confirming bowel obstruction in the right lower quadrant, secondary to adhesions. -Patient deferred surgery at this time.  Continue NG tube with continuous suction for now and follow-up KUBs daily. -Patient remains n.p.o. for now-  7.  DVT prophylaxis-teds and SCDs  8.  Hypokalemia and hypocalcemia-replaced appropriately.  Secondary to GI losses from NG output  Updated daughter and daughter-in-law at bedside PT consulted.   All the records are reviewed and case discussed with Care Management/Social Workerr. Management plans discussed with the patient, family and they are in agreement.  CODE STATUS: DNR  TOTAL TIME TAKING CARE OF THIS PATIENT: 36 minutes.   POSSIBLE D/C IN 2-3 DAYS, DEPENDING ON CLINICAL CONDITION.   Gladstone Lighter M.D on 08/10/2017 at 10:27 AM  Between 7am to 6pm - Pager - [phone]  After 6pm go to [email or web address] - password [email or web address] [facility]  Office  [phone]  CC: Primary care physician; System, Pcp Not In

## 2017-08-10 NOTE — Progress Notes (Signed)
[facility] Hematology/Oncology Progress Note  Date of admission: 08/03/2017  Hospital day:  08/10/2017  Chief Complaint: Terry Wood. is a 84 y.o. 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  Subjective:  He is "hungry".  Mild nausea.  NG in place.  Diarrhea resolved.  Hematuria improved.  Social History: The patient is accompanied by his son and daughter today.  Allergies: No Known Allergies  Scheduled Medications: . acidophilus  1 capsule Oral Daily  . insulin aspart  0-9 Units Subcutaneous TID WC  . mouth rinse  15 mL Mouth Rinse BID  . nicotine  21 mg Transdermal Daily  . omega-3 acid ethyl esters  2 g Oral BID  . tamsulosin  0.4 mg Oral Daily  . Tbo-Filgrastim  480 mcg Subcutaneous q1800    Review of Systems: GENERAL:  Feels "the same".  No fevers, sweats or weight loss. PERFORMANCE STATUS (ECOG):  2 HEENT:  No visual changes, runny nose, sore throat, mouth sores or tenderness. Lungs: No shortness of breath or cough.  No hemoptysis. Cardiac:  No chest pain, palpitations, orthopnea, or PND. GI:  NG in place with some nausea.  No emesis.  Feels hungry. No diarrhea.  No melena or hematochezia. GU:  Foley in place.  Urine clearing.   Musculoskeletal:  No back pain.  No joint pain.  No muscle tenderness. Extremities:  No pain or swelling. Skin:  No rashes or skin changes. Neuro:  No headache, numbness or weakness, balance or coordination issues. Endocrine:  No diabetes, thyroid issues, hot flashes or night sweats. Psych:  Frustrated.  No mood changes, depression or anxiety. Pain:  No focal pain. Review of systems:  All other systems reviewed and found to be negative.  Physical Exam: Blood pressure 101/78, pulse 89, temperature 98.1 F (36.7 C), temperature source Oral, resp. rate 18, height '5\' 8"'  (1.727 m), weight 186  lb 11.7 oz (84.7 kg), SpO2 97 %.  GENERAL:  Elderly gentleman sitting comfortably on the medical unit in no acute distress. MENTAL STATUS:  Alert and oriented to person, place and time. HEAD:  Short gray hair.  Normocephalic, atraumatic, face symmetric, no Cushingoid features. EYES:  Pupils equal round and reactive to light and accomodation.  No conjunctivitis or scleral icterus. ENT:  NG in place.  Oropharynx clear without lesion.  Tongue normal. Mucous membranes moist.  RESPIRATORY:  Clear to auscultation without rales, wheezes or rhonchi. CARDIOVASCULAR:  Regular rate and rhythm without murmur, rub or gallop. ABDOMEN:  Distended.  Bowel sounds.  Soft, non-tender, with active bowel sounds, and no hepatosplenomegaly.  No masses.  No guarding or rebound tenderness. SKIN:  No rashes, ulcers or lesions. EXTREMITIES: No edema, no skin discoloration or tenderness.  No palpable cords. NEUROLOGICAL: Unremarkable. PSYCH:  Appropriate.    Results for orders placed or performed during the hospital encounter of 08/03/17 (from the past 48 hour(s))  Glucose, capillary     Status: Abnormal   Collection Time: 08/08/17  4:40 PM  Result Value Ref Range   Glucose-Capillary 118 (H) 70 - 99 mg/dL  Glucose, capillary     Status: Abnormal   Collection Time: 08/08/17  9:31 PM  Result Value Ref Range   Glucose-Capillary 112 (H) 70 - 99 mg/dL   Comment 1 Notify RN   CBC with Differential     Status: Abnormal   Collection Time: 08/09/17  4:59 AM  Result Value Ref Range   WBC 1.3 (LL) 3.8 - 10.6 K/uL    Comment: RESULT REPEATED AND VERIFIED CRITICAL VALUE NOTED.  VALUE IS CONSISTENT WITH PREVIOUSLY REPORTED AND CALLED VALUE. TOO FEW TO COUNT, SMEAR AVAILABLE FOR REVIEW    RBC 2.58 (L) 4.40 - 5.90 MIL/uL   Hemoglobin 7.9 (L) 13.0 - 18.0 g/dL   HCT 22.5 (L) 40.0 - 52.0 %   MCV 87.3 80.0 - 100.0 fL   MCH 30.7 26.0 - 34.0 pg   MCHC 35.1 32.0 - 36.0 g/dL   RDW 16.8 (H) 11.5 - 14.5 %   Platelets 141 (L) 150  - 440 K/uL   Neutrophils Relative % 53 %   Neutro Abs 0.7 (L) 1.4 - 6.5 K/uL   Lymphocytes Relative 20 %   Lymphs Abs 0.3 (L) 1.0 - 3.6 K/uL   Monocytes Relative 26 %   Monocytes Absolute 0.3 0.2 - 1.0 K/uL   Eosinophils Relative 1 %   Eosinophils Absolute 0.0 0 - 0.7 K/uL   Basophils Relative 0 %   Basophils Absolute 0.0 0 - 0.1 K/uL    Comment: Performed at [facility], [address] [place], [place] [address]  Basic metabolic panel     Status: Abnormal   Collection Time: 08/09/17  4:59 AM  Result Value Ref Range   Sodium 140 135 - 145 mmol/L   Potassium 3.5 3.5 - 5.1 mmol/L   Chloride 112 (H) 98 - 111 mmol/L   CO2 21 (L) 22 - 32 mmol/L   Glucose, Bld 120 (H) 70 - 99 mg/dL   BUN 25 (H) 8 - 23 mg/dL   Creatinine, Ser 1.62 (H) 0.61 - 1.24 mg/dL   Calcium 6.9 (L) 8.9 - 10.3 mg/dL   GFR calc non Af Amer 38 (L) >60 mL/min   GFR calc Af Amer 44 (L) >60 mL/min    Comment: (NOTE) The eGFR has been calculated using the CKD EPI equation. This calculation has not been validated in all clinical situations. eGFR's persistently <60 mL/min signify possible Chronic Kidney Disease.    Anion gap 7 5 - 15    Comment: Performed at [facility], [address] [place], [place] [address]  Magnesium     Status: None   Collection Time: 08/09/17  4:59 AM  Result Value Ref Range   Magnesium 2.1 1.7 - 2.4 mg/dL    Comment: Performed at [facility], [address] [place], [place] [address]  Type and screen Roberts     Status: None   Collection Time: 08/09/17  6:14 AM  Result Value Ref Range   ABO/RH(D) A POS    Antibody Screen NEG    Sample Expiration      08/12/2017 Performed at [facility], [address] [place], [place] [address]   Glucose, capillary     Status: Abnormal   Collection Time: 08/09/17  7:39 AM  Result Value Ref Range   Glucose-Capillary 101 (H) 70 - 99 mg/dL  Glucose, capillary     Status: Abnormal    Collection Time: 08/09/17 11:52 AM  Result Value Ref Range   Glucose-Capillary 103 (H) 70 - 99 mg/dL  Glucose, capillary     Status: None   Collection Time: 08/09/17  4:51 PM  Result Value Ref Range   Glucose-Capillary 99 70 - 99 mg/dL  Glucose, capillary     Status: None   Collection Time: 08/09/17  9:30  PM  Result Value Ref Range   Glucose-Capillary 78 70 - 99 mg/dL  CBC with Differential     Status: Abnormal   Collection Time: 08/10/17  4:46 AM  Result Value Ref Range   WBC 4.8 3.8 - 10.6 K/uL   RBC 2.61 (L) 4.40 - 5.90 MIL/uL   Hemoglobin 8.0 (L) 13.0 - 18.0 g/dL   HCT 23.1 (L) 40.0 - 52.0 %   MCV 88.4 80.0 - 100.0 fL   MCH 30.6 26.0 - 34.0 pg   MCHC 34.7 32.0 - 36.0 g/dL   RDW 17.0 (H) 11.5 - 14.5 %   Platelets 169 150 - 440 K/uL   Neutrophils Relative % 60 %   Lymphocytes Relative 15 %   Monocytes Relative 13 %   Eosinophils Relative 0 %   Basophils Relative 0 %   Band Neutrophils 9 %   Metamyelocytes Relative 3 %   Myelocytes 0 %   Promyelocytes Relative 0 %   Blasts 0 %   nRBC 0 0 /100 WBC   Other 0 %   Neutro Abs 3.5 1.4 - 6.5 K/uL   Lymphs Abs 0.7 (L) 1.0 - 3.6 K/uL   Monocytes Absolute 0.6 0.2 - 1.0 K/uL   Eosinophils Absolute 0.0 0 - 0.7 K/uL   Basophils Absolute 0.0 0 - 0.1 K/uL   RBC Morphology MIXED RBC POPULATION     Comment: ELLIPTOCYTES Performed at [facility], [address] [place], [place] [address]   Basic metabolic panel     Status: Abnormal   Collection Time: 08/10/17  4:46 AM  Result Value Ref Range   Sodium 143 135 - 145 mmol/L   Potassium 3.0 (L) 3.5 - 5.1 mmol/L   Chloride 113 (H) 98 - 111 mmol/L   CO2 22 22 - 32 mmol/L   Glucose, Bld 102 (H) 70 - 99 mg/dL   BUN 32 (H) 8 - 23 mg/dL   Creatinine, Ser 1.64 (H) 0.61 - 1.24 mg/dL   Calcium 6.7 (L) 8.9 - 10.3 mg/dL   GFR calc non Af Amer 38 (L) >60 mL/min   GFR calc Af Amer 44 (L) >60 mL/min    Comment: (NOTE) The eGFR has been calculated using the CKD EPI equation. This  calculation has not been validated in all clinical situations. eGFR's persistently <60 mL/min signify possible Chronic Kidney Disease.    Anion gap 8 5 - 15    Comment: Performed at [facility], [address] [place], [place] [address]  Glucose, capillary     Status: None   Collection Time: 08/10/17  8:31 AM  Result Value Ref Range   Glucose-Capillary 96 70 - 99 mg/dL  Glucose, capillary     Status: None   Collection Time: 08/10/17 12:09 PM  Result Value Ref Range   Glucose-Capillary 90 70 - 99 mg/dL   Ct Abdomen Pelvis Wo Contrast  Result Date: 08/09/2017 CLINICAL DATA:  Hematuria. Diarrhea. Symptoms for 1 week. Receiving chemotherapy and radiation for prostate cancer. Question bowel obstruction by plain film today. EXAM: CT ABDOMEN AND PELVIS WITHOUT CONTRAST TECHNIQUE: Multidetector CT imaging of the abdomen and pelvis was performed following the standard protocol without IV contrast. COMPARISON:  08/09/2017 KUB and CT abdomen pelvis 04/01/2017 FINDINGS: Lower chest: There is bibasilar atelectasis. Coronary artery stent or calcification present. Small pericardial effusion. Hepatobiliary: The liver is homogeneous. Layering calcified gallstones. Pancreas: Unremarkable. No pancreatic ductal dilatation or surrounding inflammatory changes. Spleen: Normal in size without focal abnormality.  Adrenals/Urinary Tract: Adrenal glands are normal.There is moderate LEFT-sided hydronephrosis. The dilated LEFT ureter extends to the level of the ureterovesical junction. At the ureterovesical junction on the previous study, enhancing mass was identified. There is still mass effect in this region but the lack of intravenous contrast limits evaluation of the mass. There is mild RIGHT-sided hydronephrosis, increased compared with the prior study. Stable appearance pole renal mass, now of solid RIGHT LOWER measuring 3.9 x 3.6 centimeters and previously measuring 3.1 x 3.9 centimeters. Stable LEFT renal cyst  measures 3 centimeters. Urinary bladder is decompressed by a Foley catheter. There is heterogeneous lumen of the bladder, consistent with known hematuria. Mass effect of mass in the LEFT ureterovesical junction region is again noted but not as well evaluated without intravenous contrast. Suspect mass measures at least 3.5 centimeters. Stomach/Bowel: The stomach is distended. Small bowel loops are dilated to level of the RIGHT LOWER QUADRANT where normal caliber small bowel loops are present. The precise point of obstruction is not identified. There is no mass the RIGHT LOWER QUADRANT. The loops of colon are decompressed. Appendix is not seen. Vascular/Lymphatic: There is atherosclerotic calcification of the abdominal aorta. Suspect short-segment dissection or penetrating ulcer in the anterior LOWER aorta, measuring 2.5 centimeters in length and 1.1 centimeters in width. This is favored to represent a chronic process. Reproductive: Prostatic calcifications are present. Other: There is fat within the LEFT inguinal ring. Anterior abdominal wall is notable for small locules of gas from recent presumed subcutaneous injections. There is bilateral minimal gynecomastia. Mild subcutaneous edema. Musculoskeletal: Significant degenerative changes in the mid lumbar spine and LOWER thoracic spine. There are sclerotic lesions associated with fractures involving the RIGHT anterior 10th, 9th, 8th, and 7th ribs. Sclerotic lesion in the LEFT anterior 7th rib. There are new faintly sclerotic lesions involving L2, the RIGHT and LEFT sacrum, and the RIGHT posterior ilium. IMPRESSION: 1. Interval increase in RIGHT-sided hydronephrosis, likely secondary to known mass within the urinary bladder. 2. Heterogeneous contents of the urinary bladder, consistent with known hematuria and mass in the region of the LEFT ureterovesical junction. This mass is not as well evaluated today on this noncontrast exam. 3. Small bowel obstruction to level of  the RIGHT LOWER QUADRANT, possibly related to adhesions. No definite mass identified as a point of obstruction. 4. Solid mass in the LOWER pole the RIGHT kidney, suspicious for renal cell carcinoma. 5. New osseous metastatic disease. 6. Coronary artery disease. 7.  Aortic atherosclerosis.  (ICD10-I70.0) Electronically Signed   By: Nolon Nations M.D.   On: 08/09/2017 12:49   Dg Abd 1 View  Result Date: 08/10/2017 CLINICAL DATA:  Small bowel obstruction. EXAM: ABDOMEN - 1 VIEW COMPARISON:  08/09/2017 FINDINGS: An enteric tube terminates in the region of the proximal gastric body. There is persistent diffuse small bowel dilatation which has not significantly changed. The visualized lung bases are grossly clear. No acute osseous abnormality is identified. IMPRESSION: Unchanged small bowel dilatation consistent with obstruction. Electronically Signed   By: Logan Bores M.D.   On: 08/10/2017 08:52   Dg Abd 1 View  Result Date: 08/09/2017 CLINICAL DATA:  Check NGT placement EXAM: ABDOMEN - 1 VIEW COMPARISON:  None. FINDINGS: Nasogastric catheter is noted within the stomach. Small bowel dilatation is again identified. IMPRESSION: Nasogastric catheter within the stomach. Electronically Signed   By: Inez Catalina M.D.   On: 08/09/2017 14:16   Dg Abd 1 View  Result Date: 08/09/2017 CLINICAL DATA:  Sepsis secondary to UTI  per chart (admission). Now with vomiting. EXAM: ABDOMEN - 1 VIEW COMPARISON:  None. FINDINGS: Moderately distended gas-filled loops of small bowel throughout the abdomen and upper pelvis. Large bowel appears relatively decompressed. Most prominent small bowel distension measures 5.2 cm. No evidence of soft tissue mass or abnormal fluid collection. No evidence of free intraperitoneal air. No evidence of renal or ureteral calculi. No acute or suspicious osseous finding. IMPRESSION: Findings are consistent with small-bowel obstruction. Moderately distended gas-filled loops of small bowel are seen  throughout the abdomen and upper pelvis, with most prominent small bowel distension measuring 5.2 cm. Large bowel appears relatively decompressed suggesting either high-grade partial small bowel obstruction or early complete obstruction. Recommend immediate surgical consultation if has not already been obtained. These results will be called to the ordering clinician or representative by the Radiologist Assistant, and communication documented in the PACS or zVision Dashboard. Electronically Signed   By: Franki Cabot M.D.   On: 08/09/2017 08:45   US Pelvis Limited (transabdominal Only)  Result Date: 08/08/2017 CLINICAL DATA:  84 year old with urinary retention. EXAM: LIMITED ULTRASOUND OF PELVIS TECHNIQUE: Limited transabdominal ultrasound examination of the pelvis was performed with attention to the urinary bladder. COMPARISON:  Urinary tract ultrasound 08/06/2017 or the bladder was decompressed by Foley catheter. CT abdomen and pelvis 04/01/2017. FINDINGS: Mass involving the LEFT posterolateral wall of the urinary bladder as noted on the recent CT, measuring approximately 4.1 x 1.6 x 1.9 cm. Pre-void volume:  272 ml Post-void volume: Patient was unable to void and therefore a post-void volume was not obtained. Other findings:  None. IMPRESSION: 1. Relatively decompressed urinary bladder with a volume of 272 mL. 2. Patient was not able to void and therefore a post-void volume was not obtained. 3. Mass involving the LEFT posterolateral wall of the urinary bladder, maximum measurement approximately 4 cm, as noted on the recent CT. Electronically Signed   By: Evangeline Dakin M.D.   On: 08/08/2017 18:24    Assessment:  Terry Wood. is a 84 y.o. male with metastatic prostate cancer currently day 12 s/p cycle #3 cabazitaxel.  He presented with syncope secondary to orthostatic hypotension.    He has hematuria with Foley catheter in place.  Bladder irrigation complete.  He has chronic anemia exacerbated by  hematuria and recent chemotherapy.  He has received 2 units of PRBCs.  Hematuria is resolving.  He has fever and neutropenia.  Counts are improving, day 4 Granix today.  Blood and urine cultures are negative.  CXR is negative.    He has diarrhea with stool positive for enteropathogenic E coli.   He is on Cefepime.  He has ileus versus small bowel obstruction.  NG is in place.  Abdomen and pelvic CT on 08/09/2017 revealed interval increase in RIGHT-sided hydronephrosis, likely secondary to known mass within the urinary bladder. There were heterogeneous contents of the urinary bladder, c/w known hematuria and mass in the region of the LEFT ureterovesical junction.  There was a small bowel obstruction to level of the RIGHT LOWER QUADRANT, possibly related to adhesions. No definite mass was identified. There is a RIGHT LOWER pole kidney mass suspicious for renal cell carcinoma (known).  There is new osseous metastatic disease.  Plan:   1.  Oncology:  Day 12 s/p cycle #3 cabazitaxel for castrate resistant metastatic prostate cancer.  He recently completed palliative radiation.    Imaging studies reveal new osseous metastatic disease (L2, right and left sacrum, and right posterior ileum) compared to  04/01/2017.  Patient followed by Dr. Grayland Ormond in clinic.  2.  Hematology:  Patient developed neutropenia on day 8 of cycle #1 cabazitaxel (not checked with cycle #2).  Patient has been considered in the outpatient department for [facility]  Granix/Neupogen initiated on 08/06/2017.  ANC adequate today.  Discontinue Granix.  Transfuse PRBCs as needed given ongoing hematuria to maintain a hemoglobin 7 - 8.  Hemoglobin currently 8.0.  Agree with holding anticoagulation.  [place] 3500 (normal).  CBC with diff daily.  3.  Urology:  Bladder irrigation on hold.  Foley catheter remains in place.  Urine clearing.  Abdomen CT on 04/01/2017 revealed an enhancing lesion in the prostate directly invading the left  posterior bladder with moderate left sided hydroureteronephrosis.  Renal ultrasound revealed mild right sided hydronephrosis and a stable 3.8 cm solid right renal mass.  Abdomen and pelvis CT scan 08/09/2017 revealed increase in right sided hydronephrosis with no mass in the region of the left ureterovesical junction.  4.  Infectious disease:  Fever and neutropenia with this admission, resolved.  Tmax in past 24 hours 99.1.  Blood cultures q 24 hours prn temp >= 100.4.  Patient on Cefepime.    5.  Gastroenterology:  Diarrhea initially felt secondary to radiation, but stool + enteropathogenic E coli.  Patient on contact isolation.  Ileus/obstructive symptoms began yesterday.  Imaging confirmed.  Etiology possibly secondary to adhesions.  No obvious tumor mass seen.  NG in place.  Continue conservative management.  Appreciate surgery consult.  6.  Code status:  DNR per chart.   Lequita Asal, MD  08/10/2017, 12:23 PM

## 2017-08-11 ENCOUNTER — Inpatient Hospital Stay: Payer: Medicare Other

## 2017-08-11 DIAGNOSIS — R339 Retention of urine, unspecified: Secondary | ICD-10-CM

## 2017-08-11 DIAGNOSIS — Z192 Hormone resistant malignancy status: Secondary | ICD-10-CM

## 2017-08-11 DIAGNOSIS — C61 Malignant neoplasm of prostate: Secondary | ICD-10-CM

## 2017-08-11 DIAGNOSIS — N1330 Unspecified hydronephrosis: Secondary | ICD-10-CM

## 2017-08-11 DIAGNOSIS — R338 Other retention of urine: Secondary | ICD-10-CM

## 2017-08-11 LAB — CBC WITH DIFFERENTIAL/PLATELET
Band Neutrophils: 2 %
Basophils Absolute: 0 10*3/uL (ref 0–0.1)
Basophils Relative: 0 %
Blasts: 0 %
Eosinophils Absolute: 0 10*3/uL (ref 0–0.7)
Eosinophils Relative: 0 %
HCT: 23.7 % — ABNORMAL LOW (ref 40.0–52.0)
Hemoglobin: 8.2 g/dL — ABNORMAL LOW (ref 13.0–18.0)
Lymphocytes Relative: 11 %
Lymphs Abs: 1.4 10*3/uL (ref 1.0–3.6)
MCH: 30.6 pg (ref 26.0–34.0)
MCHC: 34.4 g/dL (ref 32.0–36.0)
MCV: 88.9 fL (ref 80.0–100.0)
Metamyelocytes Relative: 0 %
Monocytes Absolute: 0.5 10*3/uL (ref 0.2–1.0)
Monocytes Relative: 4 %
Myelocytes: 0 %
Neutro Abs: 11.1 10*3/uL — ABNORMAL HIGH (ref 1.4–6.5)
Neutrophils Relative %: 83 %
Other: 0 %
Platelets: 197 10*3/uL (ref 150–440)
Promyelocytes Relative: 0 %
RBC: 2.67 MIL/uL — ABNORMAL LOW (ref 4.40–5.90)
RDW: 16.7 % — ABNORMAL HIGH (ref 11.5–14.5)
Smear Review: ADEQUATE
WBC: 13 10*3/uL — ABNORMAL HIGH (ref 3.8–10.6)
nRBC: 0 /100 WBC

## 2017-08-11 LAB — BASIC METABOLIC PANEL
Anion gap: 6 (ref 5–15)
BUN: 27 mg/dL — AB (ref 8–23)
CO2: 23 mmol/L (ref 22–32)
CREATININE: 1.37 mg/dL — AB (ref 0.61–1.24)
Calcium: 6.5 mg/dL — ABNORMAL LOW (ref 8.9–10.3)
Chloride: 117 mmol/L — ABNORMAL HIGH (ref 98–111)
GFR calc non Af Amer: 47 mL/min — ABNORMAL LOW (ref >60)
GFR, EST AFRICAN AMERICAN: 54 mL/min — AB (ref >60)
Glucose, Bld: 115 mg/dL — ABNORMAL HIGH (ref 70–99)
Potassium: 3.1 mmol/L — ABNORMAL LOW (ref 3.5–5.1)
SODIUM: 146 mmol/L — AB (ref 135–145)

## 2017-08-11 LAB — GLUCOSE, CAPILLARY
Glucose-Capillary: 97 mg/dL (ref 70–99)
Glucose-Capillary: 96 mg/dL (ref 70–99)
Glucose-Capillary: 87 mg/dL (ref 70–99)
Glucose-Capillary: 82 mg/dL (ref 70–99)

## 2017-08-11 MED ORDER — POTASSIUM CHLORIDE 10 MEQ/100ML IV SOLN
10.0000 meq | INTRAVENOUS | Status: AC
Start: 2017-08-11 — End: 2017-08-12
  Administered 2017-08-11 – 2017-08-12 (×6): 10 meq via INTRAVENOUS
  Filled 2017-08-11 (×10): qty 100

## 2017-08-11 NOTE — Care Management Important Message (Signed)
Important Message  Patient Details  Name: Terry Wood. MRN: [id number] Date of Birth: [birth year]   Medicare Important Message Given:  Yes    Katrina Stack, RN 08/11/2017, 4:44 PM

## 2017-08-11 NOTE — Progress Notes (Signed)
[facility] Hematology/Oncology Progress Note  Date of admission: 08/03/2017  Hospital day:  08/11/2017  Chief Complaint: Terry Wood. is a 84 y.o. 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  Subjective:  Feels better today.  Soft mushy stool.  No GI bleeding.  NG in place. Urine continues to clear.  Social History: The patient is accompanied by his son today.  Allergies: No Known Allergies  Scheduled Medications: . acidophilus  1 capsule Oral Daily  . insulin aspart  0-9 Units Subcutaneous TID WC  . mouth rinse  15 mL Mouth Rinse BID  . nicotine  21 mg Transdermal Daily  . omega-3 acid ethyl esters  2 g Oral BID  . tamsulosin  0.4 mg Oral Daily    Review of Systems: GENERAL:  Feels "better".  No fevers, sweats or weight loss. PERFORMANCE STATUS (ECOG):  2 HEENT:  No visual changes, runny nose, sore throat, mouth sores or tenderness. Lungs: No shortness of breath or cough.  No hemoptysis. Cardiac:  No chest pain, palpitations, orthopnea, or PND. GI:  NG in place.  Soft stool today (no diarrhea).  Less distended.  No nausea, vomiting, diarrhea, constipation, melena or hematochezia. GU:  Foley in place.  Urine clearing. Musculoskeletal:  No back pain.  No joint pain.  No muscle tenderness. Extremities:  No pain or swelling. Skin:  No rashes or skin changes. Neuro:  No headache, numbness or weakness, balance or coordination issues. Endocrine:  No diabetes, thyroid issues, hot flashes or night sweats. Psych:  Mood better.  No mood changes, depression or anxiety. Pain:  No focal pain. Review of systems:  All other systems reviewed and found to be negative.   Physical Exam: Blood pressure 119/67, pulse 73, temperature (!) 97.5 F (36.4 C), temperature source Oral, resp. rate 18, height '5\' 8"'  (1.727 m), weight 184 lb 8 oz  (83.7 kg), SpO2 99 %.  GENERAL:  Elderly gentleman sitting comfortably on the medical unit in no acute distress. MENTAL STATUS:  Alert and oriented to person, place and time. HEAD:  Short gray hair.  Normocephalic, atraumatic, face symmetric, no Cushingoid features. EYES:  Pupils equal round.  No conjunctivitis or scleral icterus. ENT:  NG tube in place. RESPIRATORY:  Clear to auscultation without rales, wheezes or rhonchi. CARDIOVASCULAR:  Regular rate and rhythm without murmur, rub or gallop. ABDOMEN:  Less distended.  Decreased bowel sounds.  Soft, non-tender, with active bowel sounds, and no hepatosplenomegaly.  No masses. SKIN:  No rashes, ulcers or lesions. EXTREMITIES: No edema, no skin discoloration or tenderness.  No palpable cords. LYMPH NODES: No palpable cervical, supraclavicular, axillary or inguinal adenopathy  NEUROLOGICAL: Unremarkable. PSYCH:  Appropriate.    Results for orders placed or performed during the hospital encounter of 08/03/17 (from the past 48 hour(s))  Glucose, capillary     Status: None   Collection Time: 08/09/17  9:30 PM  Result Value Ref Range   Glucose-Capillary 78 70 - 99 mg/dL  CBC with Differential     Status: Abnormal   Collection Time: 08/10/17  4:46 AM  Result Value Ref Range   WBC 4.8 3.8 - 10.6 K/uL   RBC 2.61 (L) 4.40 - 5.90 MIL/uL   Hemoglobin 8.0 (L) 13.0 - 18.0 g/dL   HCT 23.1 (L) 40.0 - 52.0 %   MCV 88.4 80.0 - 100.0 fL   MCH 30.6  26.0 - 34.0 pg   MCHC 34.7 32.0 - 36.0 g/dL   RDW 17.0 (H) 11.5 - 14.5 %   Platelets 169 150 - 440 K/uL   Neutrophils Relative % 60 %   Lymphocytes Relative 15 %   Monocytes Relative 13 %   Eosinophils Relative 0 %   Basophils Relative 0 %   Band Neutrophils 9 %   Metamyelocytes Relative 3 %   Myelocytes 0 %   Promyelocytes Relative 0 %   Blasts 0 %   nRBC 0 0 /100 WBC   Other 0 %   Neutro Abs 3.5 1.4 - 6.5 K/uL   Lymphs Abs 0.7 (L) 1.0 - 3.6 K/uL   Monocytes Absolute 0.6 0.2 - 1.0 K/uL    Eosinophils Absolute 0.0 0 - 0.7 K/uL   Basophils Absolute 0.0 0 - 0.1 K/uL   RBC Morphology MIXED RBC POPULATION     Comment: ELLIPTOCYTES Performed at [facility], [address] [place], [place] [address]   Basic metabolic panel     Status: Abnormal   Collection Time: 08/10/17  4:46 AM  Result Value Ref Range   Sodium 143 135 - 145 mmol/L   Potassium 3.0 (L) 3.5 - 5.1 mmol/L   Chloride 113 (H) 98 - 111 mmol/L   CO2 22 22 - 32 mmol/L   Glucose, Bld 102 (H) 70 - 99 mg/dL   BUN 32 (H) 8 - 23 mg/dL   Creatinine, Ser 1.64 (H) 0.61 - 1.24 mg/dL   Calcium 6.7 (L) 8.9 - 10.3 mg/dL   GFR calc non Af Amer 38 (L) >60 mL/min   GFR calc Af Amer 44 (L) >60 mL/min    Comment: (NOTE) The eGFR has been calculated using the CKD EPI equation. This calculation has not been validated in all clinical situations. eGFR's persistently <60 mL/min signify possible Chronic Kidney Disease.    Anion gap 8 5 - 15    Comment: Performed at [facility], [address] [place], [place] [address]  Glucose, capillary     Status: None   Collection Time: 08/10/17  8:31 AM  Result Value Ref Range   Glucose-Capillary 96 70 - 99 mg/dL  Glucose, capillary     Status: None   Collection Time: 08/10/17 12:09 PM  Result Value Ref Range   Glucose-Capillary 90 70 - 99 mg/dL  Glucose, capillary     Status: Abnormal   Collection Time: 08/10/17  5:34 PM  Result Value Ref Range   Glucose-Capillary 106 (H) 70 - 99 mg/dL  Glucose, capillary     Status: Abnormal   Collection Time: 08/10/17  9:15 PM  Result Value Ref Range   Glucose-Capillary 105 (H) 70 - 99 mg/dL  CBC with Differential     Status: Abnormal   Collection Time: 08/11/17  4:54 AM  Result Value Ref Range   WBC 13.0 (H) 3.8 - 10.6 K/uL   RBC 2.67 (L) 4.40 - 5.90 MIL/uL   Hemoglobin 8.2 (L) 13.0 - 18.0 g/dL   HCT 23.7 (L) 40.0 - 52.0 %   MCV 88.9 80.0 - 100.0 fL   MCH 30.6 26.0 - 34.0 pg   MCHC 34.4 32.0 - 36.0 g/dL   RDW 16.7 (H)  11.5 - 14.5 %   Platelets 197 150 - 440 K/uL   Neutrophils Relative % 83 %   Lymphocytes Relative 11 %   Monocytes Relative 4 %   Eosinophils Relative 0 %   Basophils Relative 0 %  Band Neutrophils 2 %   Metamyelocytes Relative 0 %   Myelocytes 0 %   Promyelocytes Relative 0 %   Blasts 0 %   nRBC 0 0 /100 WBC   Other 0 %   Neutro Abs 11.1 (H) 1.4 - 6.5 K/uL   Lymphs Abs 1.4 1.0 - 3.6 K/uL   Monocytes Absolute 0.5 0.2 - 1.0 K/uL   Eosinophils Absolute 0.0 0 - 0.7 K/uL   Basophils Absolute 0.0 0 - 0.1 K/uL   Smear Review      PLATELET CLUMPS NOTED ON SMEAR, COUNT APPEARS ADEQUATE    Comment: Performed at [facility], [address] [place], [place] [address]  Basic metabolic panel     Status: Abnormal   Collection Time: 08/11/17  4:54 AM  Result Value Ref Range   Sodium 146 (H) 135 - 145 mmol/L   Potassium 3.1 (L) 3.5 - 5.1 mmol/L   Chloride 117 (H) 98 - 111 mmol/L   CO2 23 22 - 32 mmol/L   Glucose, Bld 115 (H) 70 - 99 mg/dL   BUN 27 (H) 8 - 23 mg/dL   Creatinine, Ser 1.37 (H) 0.61 - 1.24 mg/dL   Calcium 6.5 (L) 8.9 - 10.3 mg/dL   GFR calc non Af Amer 47 (L) >60 mL/min   GFR calc Af Amer 54 (L) >60 mL/min    Comment: (NOTE) The eGFR has been calculated using the CKD EPI equation. This calculation has not been validated in all clinical situations. eGFR's persistently <60 mL/min signify possible Chronic Kidney Disease.    Anion gap 6 5 - 15    Comment: Performed at [facility], [address] [place],  [address]  Glucose, capillary     Status: None   Collection Time: 08/11/17  8:37 AM  Result Value Ref Range   Glucose-Capillary 97 70 - 99 mg/dL  Glucose, capillary     Status: None   Collection Time: 08/11/17 11:47 AM  Result Value Ref Range   Glucose-Capillary 96 70 - 99 mg/dL  Glucose, capillary     Status: None   Collection Time: 08/11/17  4:54 PM  Result Value Ref Range   Glucose-Capillary 87 70 - 99 mg/dL   Dg Abd 1  View  Result Date: 08/10/2017 CLINICAL DATA:  Small bowel obstruction. EXAM: ABDOMEN - 1 VIEW COMPARISON:  08/09/2017 FINDINGS: An enteric tube terminates in the region of the proximal gastric body. There is persistent diffuse small bowel dilatation which has not significantly changed. The visualized lung bases are grossly clear. No acute osseous abnormality is identified. IMPRESSION: Unchanged small bowel dilatation consistent with obstruction. Electronically Signed   By: Logan Bores M.D.   On: 08/10/2017 08:52   Dg Abd 2 Views  Result Date: 08/11/2017 CLINICAL DATA:  Small bowel obstruction EXAM: ABDOMEN - 2 VIEW COMPARISON:  August 10, 2017 FINDINGS: Nasogastric tube tip and side port in stomach. There is significantly less bowel dilatation consistent with resolving bowel obstruction. No free air evident. Air is noted in large bowel and rectum. Visualized lung bases are clear. IMPRESSION: Nasogastric tube tip and side port in stomach. Evidence of resolving small-bowel obstruction. No new bowel dilatation. No free air. Visualized lung bases clear. Electronically Signed   By: Lowella Grip III M.D.   On: 08/11/2017 08:06    Assessment:  Terry Wood. is a 84 y.o. male with metastatic prostate cancer currently day 12 s/p cycle #3 cabazitaxel.  He presented with syncope  secondary to orthostatic hypotension.    He has hematuria with Foley catheter in place.  Bladder irrigation complete.  He has chronic anemia exacerbated by hematuria and recent chemotherapy.  He has received 2 units of PRBCs.  Hematuria continues to resolve.  He initially developed fever and neutropenia.  Counts improved s/p 4 days of Granix.  Blood and urine cultures are negative.  CXR is negative.   He has diarrhea with stool positive for enteropathogenic E coli.   He is on Cefepime.  He has ileus versus small bowel obstruction.  NG is in place.   Abdomen and pelvic CT on 08/09/2017 revealed interval increase in  RIGHT-sided hydronephrosis, likely secondary to known mass within the urinary bladder. There were heterogeneous contents of the urinary bladder, c/w known hematuria and mass in the region of the LEFT ureterovesical junction.  There was a small bowel obstruction to level of the RIGHT LOWER QUADRANT, possibly related to adhesions. No definite mass was identified. There is a RIGHT LOWER pole kidney mass suspicious for renal cell carcinoma (known).  There is new osseous metastatic disease.  Plan:   1.  Oncology:  Day 2 s/p cycle #3 cabazitaxel for castrate resistant metastatic prostate cancer.  He recently completed palliative radiation.    Imaging studies reveal new osseous metastatic disease (L2, right and left sacrum, and right posterior ileum) compared to 04/01/2017.  Patient followed by Dr. Grayland Ormond in clinic.  2.  Hematology:  Patient developed neutropenia on day 8 of cycle #1 cabazitaxel.  Patient has been considered in the outpatient department for [facility].  Granix/Neupogen initiated on 08/06/2017 (last dose 08/09/2017).  ANC is good (11,100).   Transfuse PRBCs as needed given ongoing hematuria to maintain a hemoglobin 7 - 8.  Hemoglobin currently 8.2 (improved).  Agree with holding anticoagulation.   3.  Urology:  Bladder irrigation on hold.  Foley catheter remains in place.  Urine clearing.  Plan for catheter to remain a few more days per urology.  Abdomen CT on 04/01/2017 revealed an enhancing lesion in the prostate directly invading the left posterior bladder with moderate left sided hydroureteronephrosis.  Renal ultrasound revealed mild right sided hydronephrosis and a stable 3.8 cm solid right renal mass.  Abdomen and pelvis CT scan 08/09/2017 revealed increase in right sided hydronephrosis with no mass in the region of the left ureterovesical junction.  4.  Infectious disease:  Fever and neutropenia with this admission, resolved.  Tmax in past 24 hours 98.1.  Cultures negative to  date.  Patient on Cefepime (plan to discontinue after 7 days).    5.  Gastroenterology:  Diarrhea initially felt secondary to radiation, but stool + enteropathogenic E coli.  Patient on contact isolation.   Ileus/obstructive symptoms began 08/09/2017.  Etiology possibly secondary to adhesions.  No obvious tumor mass seen.  NG in place.  Continue conservative management.  KUB reveals evidence of resolving small bowel obstruction.  Appreciate surgery consult.  6.  Code status:  DNR per chart.  Addendum:  Dr Grayland Ormond will be back tomorrow.   Lequita Asal, MD  08/11/2017, 6:45 PM

## 2017-08-11 NOTE — Evaluation (Signed)
Physical Therapy Evaluation Patient Details Name: Terry Wood. MRN: [id number] DOB: [birth year] Today's Date: 08/11/2017   History of Present Illness  Jupiter Kabir is an 84yo male who comes to [facility] on 7/27 after an episode of syncope and fall. PMH: metastatic PrCA (done with radiaition, cont chemostherapy as tolerated), HTN. Pt had prsistent orthostatic BP after arrival, and subsequently has a bowel obstruction, now with NG suction.    Clinical Impression  Pt admitted with above diagnosis. Pt currently with functional limitations due to the deficits listed below (see "PT Problem List"). Upon entry, pt in bed, daughter present. The pt is awake and agreeable to participate today. The pt is alert and oriented x3, pleasant, conversational, and following simple commands consistently. Pt requires physical assist for bed mobility and transfers, but once up and standing, takes some short distance walking steps ad lib before fatigue is a major limitation. Functional mobility assessment demonstrates increased effort/time requirements, poor tolerance, and need for physical assistance, whereas the patient performed these at a higher level of independence PTA. Empirically, the patient demonstrates increased risk of recurrent falls AEB gait speed <0.62m/s, forward reach <5", and falls history. Pt also which chronic orthostatic BP PTA. Pt will benefit from skilled PT intervention to increase independence and safety with basic mobility in preparation for discharge to the venue listed below.       Follow Up Recommendations Home health PT;Supervision/Assistance - 24 hour    Equipment Recommendations  None recommended by PT    Recommendations for Other Services       Precautions / Restrictions Precautions Precautions: Fall Restrictions Weight Bearing Restrictions: No      Mobility  Bed Mobility Overal bed mobility: Needs Assistance Bed Mobility: Supine to Sit     Supine to sit: HOB elevated     General bed mobility comments: BUE pulling self to sitting with good strength holding theaprists hands.   Transfers Overall transfer level: Needs assistance Equipment used: Rolling walker (2 wheeled) Transfers: Sit to/from Stand Sit to Stand: Min guard         General transfer comment: maximal effort and momentum requird to rise ultimately, c LUepushing from bed.   Ambulation/Gait   Gait Distance (Feet): 8 Feet Assistive device: Rolling walker (2 wheeled) Gait Pattern/deviations: Antalgic;Decreased stride length     General Gait Details: legs feel weak, tolerates short distance at bedside.   Stairs            Wheelchair Mobility    Modified Rankin (Stroke Patients Only)       Balance Overall balance assessment: Needs assistance;History of Falls                                           Pertinent Vitals/Pain Pain Assessment: No/denies pain    Home Living Family/patient expects to be discharged to:: Private residence Living Arrangements: Alone Available Help at Discharge: Family(daughters and Son, all help, live nearby) Type of Home: House Home Access: Ramped entrance     Home Layout: One level Home Equipment: Environmental consultant - 4 wheels;Walker - 2 wheels;Bedside commode;Shower seat;Other (comment)(unclaer if there is a WC in home. ) Additional Comments: still driving, 2 falls memorial day weekend.     Prior Function Level of Independence: Independent with assistive device(s);Needs assistance   Gait / Transfers Assistance Needed: household AMB with 7JI   ADL's / Homemaking Assistance Needed: modI  PTA         Hand Dominance   Dominant Hand: Right    Extremity/Trunk Assessment   Upper Extremity Assessment Upper Extremity Assessment: Generalized weakness    Lower Extremity Assessment Lower Extremity Assessment: Generalized weakness       Communication      Cognition Arousal/Alertness: Awake/alert Behavior During Therapy: WFL for  tasks assessed/performed Overall Cognitive Status: Within Functional Limits for tasks assessed                                        General Comments      Exercises     Assessment/Plan    PT Assessment Patient needs continued PT services  PT Problem List Decreased strength;Decreased range of motion;Decreased activity tolerance;Decreased balance;Decreased mobility       PT Treatment Interventions Balance training;Gait training;Functional mobility training;Therapeutic activities;Therapeutic exercise;Patient/family education    PT Goals (Current goals can be found in the Care Plan section)  Acute Rehab PT Goals Patient Stated Goal: return to home, regain strength n basic mobility.  PT Goal Formulation: With patient Time For Goal Achievement: 08/25/17 Potential to Achieve Goals: Good    Frequency Min 2X/week   Barriers to discharge Decreased caregiver support      Co-evaluation               AM-PAC PT "6 Clicks" Daily Activity  Outcome Measure Difficulty turning over in bed (including adjusting bedclothes, sheets and blankets)?: Unable Difficulty moving from lying on back to sitting on the side of the bed? : Unable Difficulty sitting down on and standing up from a chair with arms (e.g., wheelchair, bedside commode, etc,.)?: Unable Help needed moving to and from a bed to chair (including a wheelchair)?: A Lot Help needed walking in hospital room?: A Lot Help needed climbing 3-5 steps with a railing? : Total 6 Click Score: 8    End of Session   Activity Tolerance: Patient tolerated treatment well;Patient limited by fatigue Patient left: in chair;with family/visitor present;with call bell/phone within reach Nurse Communication: Mobility status PT Visit Diagnosis: Unsteadiness on feet (R26.81);Difficulty in walking, not elsewhere classified (R26.2);Repeated falls (R29.6);Muscle weakness (generalized) (M62.81);History of falling (Z91.81)    Time:  6546-5035 PT Time Calculation (min) (ACUTE ONLY): 25 min   Charges:   PT Evaluation $PT Eval High Complexity: 1 High PT Treatments $Therapeutic Activity: 8-22 mins        11:21 AM, 08/11/17 Etta Grandchild, PT, DPT Physical Therapist - [facility]  [phone] ([place])    Gabbie Marzo C 08/11/2017, 11:19 AM

## 2017-08-11 NOTE — Progress Notes (Addendum)
Urology Consult Follow Up  Subjective: Foley had to be replaced after failed TOV.  His urine is currently yellow, no hematuria or clots.  He is resting comfortably and wanting something to eat.  NG tube in place.  VSS afebrile.  Creatinine 1.37.  Hgb 8.2.    Anti-infectives: Anti-infectives (From admission, onward)   Start     Dose/Rate Route Frequency Ordered Stop   08/06/17 1215  ceFEPIme (MAXIPIME) 2 g in sodium chloride 0.9 % 100 mL IVPB     2 g 200 mL/hr over 30 Minutes Intravenous Every 12 hours 08/06/17 1203 08/13/17 0959   08/05/17 0700  cefTRIAXone (ROCEPHIN) 1 g in sodium chloride 0.9 % 100 mL IVPB  Status:  Discontinued     1 g 200 mL/hr over 30 Minutes Intravenous Every 24 hours 08/05/17 0647 08/06/17 1157      Current Facility-Administered Medications  Medication Dose Route Frequency Provider Last Rate Last Dose  . acetaminophen (TYLENOL) tablet 650 mg  650 mg Oral Q6H PRN Harrie Foreman, MD   650 mg at 08/08/17 1705   Or  . acetaminophen (TYLENOL) suppository 650 mg  650 mg Rectal Q6H PRN Harrie Foreman, MD      . acidophilus (RISAQUAD) capsule 1 capsule  1 capsule Oral Daily Gladstone Lighter, MD   1 capsule at 08/11/17 1017  . ceFEPIme (MAXIPIME) 2 g in sodium chloride 0.9 % 100 mL IVPB  2 g Intravenous Q12H Gladstone Lighter, MD 200 mL/hr at 08/11/17 1017 2 g at 08/11/17 1017  . dextrose 5 %-0.9 % sodium chloride infusion   Intravenous Continuous Demetrios Loll, MD 75 mL/hr at 08/11/17 0137    . feeding supplement (ENSURE ENLIVE) (ENSURE ENLIVE) liquid 237 mL  237 mL Oral TID PRN Gladstone Lighter, MD      . HYDROcodone-acetaminophen (NORCO/VICODIN) 5-325 MG per tablet 1 tablet  1 tablet Oral Q4H PRN Harrie Foreman, MD   1 tablet at 08/04/17 0606  . insulin aspart (novoLOG) injection 0-9 Units  0-9 Units Subcutaneous TID WC Hillary Bow, MD   1 Units at 08/05/17 1655  . loperamide (IMODIUM) capsule 2 mg  2 mg Oral Q6H PRN Hillary Bow, MD   2 mg at 08/07/17  2044  . MEDLINE mouth rinse  15 mL Mouth Rinse BID Gladstone Lighter, MD   15 mL at 08/11/17 1056  . nicotine (NICODERM CQ - dosed in mg/24 hours) patch 21 mg  21 mg Transdermal Daily Harrie Foreman, MD   21 mg at 08/11/17 1017  . omega-3 acid ethyl esters (LOVAZA) capsule 2 g  2 g Oral BID Hillary Bow, MD   2 g at 08/11/17 1017  . promethazine (PHENERGAN) injection 12.5 mg  12.5 mg Intravenous Q6H PRN Gladstone Lighter, MD      . tamsulosin (FLOMAX) capsule 0.4 mg  0.4 mg Oral Daily Sudini, Srikar, MD   0.4 mg at 08/11/17 1017   Facility-Administered Medications Ordered in Other Encounters  Medication Dose Route Frequency Provider Last Rate Last Dose  . ondansetron (ZOFRAN) injection 8 mg  8 mg Intravenous Once Lloyd Huger, MD         Objective: Vital signs in last 24 hours: Temp:  [97.5 F (36.4 C)] 97.5 F (36.4 C) (08/05 0521) Pulse Rate:  [73-88] 73 (08/05 0521) Resp:  [16-20] 18 (08/05 0521) BP: (119-121)/(63-67) 119/67 (08/05 0521) SpO2:  [97 %-99 %] 99 % (08/05 0521) Weight:  [184 lb 8 oz (83.7 kg)] 184  lb 8 oz (83.7 kg) (08/05 0500)  Intake/Output from previous day: 08/04 0701 - 08/05 0700 In: 1757.5 [I.V.:1657.5; IV Piggyback:100] Out: 2000 [Urine:925; Emesis/NG VPXTGG:2694] Intake/Output this shift: No intake/output data recorded.   Physical Exam Constitutional: Well nourished. Alert and oriented, No acute distress. HEENT:  AT, moist mucus membranes. Trachea midline, no masses. Cardiovascular: No clubbing, cyanosis, or edema. Respiratory: Normal respiratory effort, no increased work of breathing. GI: Abdomen is soft, non tender, non distended, no abdominal masses. Liver and spleen not palpable.  No hernias appreciated.  Stool sample for occult testing is not indicated.   GU: No CVA tenderness.  No bladder fullness or masses.  Foley in place. Skin: No rashes, bruises or suspicious lesions. Lymph: No cervical or inguinal adenopathy. Neurologic:  Grossly intact, no focal deficits, moving all 4 extremities. Psychiatric: Normal mood and affect.  Lab Results:  Recent Labs    08/10/17 0446 08/11/17 0454  WBC 4.8 13.0*  HGB 8.0* 8.2*  HCT 23.1* 23.7*  PLT 169 197   BMET Recent Labs    08/10/17 0446 08/11/17 0454  NA 143 146*  K 3.0* 3.1*  CL 113* 117*  CO2 22 23  GLUCOSE 102* 115*  BUN 32* 27*  CREATININE 1.64* 1.37*  CALCIUM 6.7* 6.5*   PT/INR No results for input(s): LABPROT, INR in the last 72 hours. ABG No results for input(s): PHART, HCO3 in the last 72 hours.  Invalid input(s): PCO2, PO2  Studies/Results: Ct Abdomen Pelvis Wo Contrast  Result Date: 08/09/2017 CLINICAL DATA:  Hematuria. Diarrhea. Symptoms for 1 week. Receiving chemotherapy and radiation for prostate cancer. Question bowel obstruction by plain film today. EXAM: CT ABDOMEN AND PELVIS WITHOUT CONTRAST TECHNIQUE: Multidetector CT imaging of the abdomen and pelvis was performed following the standard protocol without IV contrast. COMPARISON:  08/09/2017 KUB and CT abdomen pelvis 04/01/2017 FINDINGS: Lower chest: There is bibasilar atelectasis. Coronary artery stent or calcification present. Small pericardial effusion. Hepatobiliary: The liver is homogeneous. Layering calcified gallstones. Pancreas: Unremarkable. No pancreatic ductal dilatation or surrounding inflammatory changes. Spleen: Normal in size without focal abnormality. Adrenals/Urinary Tract: Adrenal glands are normal.There is moderate LEFT-sided hydronephrosis. The dilated LEFT ureter extends to the level of the ureterovesical junction. At the ureterovesical junction on the previous study, enhancing mass was identified. There is still mass effect in this region but the lack of intravenous contrast limits evaluation of the mass. There is mild RIGHT-sided hydronephrosis, increased compared with the prior study. Stable appearance pole renal mass, now of solid RIGHT LOWER measuring 3.9 x 3.6 centimeters  and previously measuring 3.1 x 3.9 centimeters. Stable LEFT renal cyst measures 3 centimeters. Urinary bladder is decompressed by a Foley catheter. There is heterogeneous lumen of the bladder, consistent with known hematuria. Mass effect of mass in the LEFT ureterovesical junction region is again noted but not as well evaluated without intravenous contrast. Suspect mass measures at least 3.5 centimeters. Stomach/Bowel: The stomach is distended. Small bowel loops are dilated to level of the RIGHT LOWER QUADRANT where normal caliber small bowel loops are present. The precise point of obstruction is not identified. There is no mass the RIGHT LOWER QUADRANT. The loops of colon are decompressed. Appendix is not seen. Vascular/Lymphatic: There is atherosclerotic calcification of the abdominal aorta. Suspect short-segment dissection or penetrating ulcer in the anterior LOWER aorta, measuring 2.5 centimeters in length and 1.1 centimeters in width. This is favored to represent a chronic process. Reproductive: Prostatic calcifications are present. Other: There is fat within the  LEFT inguinal ring. Anterior abdominal wall is notable for small locules of gas from recent presumed subcutaneous injections. There is bilateral minimal gynecomastia. Mild subcutaneous edema. Musculoskeletal: Significant degenerative changes in the mid lumbar spine and LOWER thoracic spine. There are sclerotic lesions associated with fractures involving the RIGHT anterior 10th, 9th, 8th, and 7th ribs. Sclerotic lesion in the LEFT anterior 7th rib. There are new faintly sclerotic lesions involving L2, the RIGHT and LEFT sacrum, and the RIGHT posterior ilium. IMPRESSION: 1. Interval increase in RIGHT-sided hydronephrosis, likely secondary to known mass within the urinary bladder. 2. Heterogeneous contents of the urinary bladder, consistent with known hematuria and mass in the region of the LEFT ureterovesical junction. This mass is not as well evaluated  today on this noncontrast exam. 3. Small bowel obstruction to level of the RIGHT LOWER QUADRANT, possibly related to adhesions. No definite mass identified as a point of obstruction. 4. Solid mass in the LOWER pole the RIGHT kidney, suspicious for renal cell carcinoma. 5. New osseous metastatic disease. 6. Coronary artery disease. 7.  Aortic atherosclerosis.  (ICD10-I70.0) Electronically Signed   By: Nolon Nations M.D.   On: 08/09/2017 12:49   Dg Abd 1 View  Result Date: 08/10/2017 CLINICAL DATA:  Small bowel obstruction. EXAM: ABDOMEN - 1 VIEW COMPARISON:  08/09/2017 FINDINGS: An enteric tube terminates in the region of the proximal gastric body. There is persistent diffuse small bowel dilatation which has not significantly changed. The visualized lung bases are grossly clear. No acute osseous abnormality is identified. IMPRESSION: Unchanged small bowel dilatation consistent with obstruction. Electronically Signed   By: Logan Bores M.D.   On: 08/10/2017 08:52   Dg Abd 1 View  Result Date: 08/09/2017 CLINICAL DATA:  Check NGT placement EXAM: ABDOMEN - 1 VIEW COMPARISON:  None. FINDINGS: Nasogastric catheter is noted within the stomach. Small bowel dilatation is again identified. IMPRESSION: Nasogastric catheter within the stomach. Electronically Signed   By: Inez Catalina M.D.   On: 08/09/2017 14:16   Dg Abd 2 Views  Result Date: 08/11/2017 CLINICAL DATA:  Small bowel obstruction EXAM: ABDOMEN - 2 VIEW COMPARISON:  August 10, 2017 FINDINGS: Nasogastric tube tip and side port in stomach. There is significantly less bowel dilatation consistent with resolving bowel obstruction. No free air evident. Air is noted in large bowel and rectum. Visualized lung bases are clear. IMPRESSION: Nasogastric tube tip and side port in stomach. Evidence of resolving small-bowel obstruction. No new bowel dilatation. No free air. Visualized lung bases clear. Electronically Signed   By: Lowella Grip III M.D.   On:  08/11/2017 08:06     Assessment: and Plan: 1. Castrate resistant metastatic prostate cancer Managed through the Cancer center by Dr. Grayland Ormond  2. Urinary retention Foley in place draining clear yellow urine - will need to stay in place for 3-5 days - family and patient as hesitant to have it removed Continue to trend creatinine - RUS if no improvement over the next day   3. Moderate left hydronephrosis/mild right hydronephrosis Patient has chosen conservative management at this time will consider nephrostomy tubes if renal function worsens   LOS: 7 days    [facility] 08/11/2017

## 2017-08-11 NOTE — Progress Notes (Signed)
[facility] [place] NAME: Normon Pettijohn    MR#:  [id number]  DATE OF BIRTH:  Jul 25, 1933  SUBJECTIVE:  CHIEF COMPLAINT:   Chief Complaint  Patient presents with  . Diarrhea  . Hematuria  . Loss of Consciousness   -Patient alert and sitting in the chair today. -NG tube with minimal output today and clinically improving. -Foley catheter in draining clear urine   REVIEW OF SYSTEMS:  Review of Systems  Constitutional: Positive for malaise/fatigue. Negative for chills and fever.  HENT: Negative for congestion, ear discharge, hearing loss and nosebleeds.   Eyes: Negative for blurred vision and double vision.  Respiratory: Negative for cough, shortness of breath and wheezing.   Cardiovascular: Negative for chest pain and palpitations.  Gastrointestinal: Positive for abdominal pain. Negative for constipation, diarrhea, nausea and vomiting.  Genitourinary: Negative for dysuria and hematuria.  Musculoskeletal: Negative for myalgias.  Neurological: Negative for dizziness, focal weakness, seizures, weakness and headaches.  Psychiatric/Behavioral: Negative for depression.    DRUG ALLERGIES:  No Known Allergies  VITALS:  Blood pressure 119/67, pulse 73, temperature (!) 97.5 F (36.4 C), temperature source Oral, resp. rate 18, height 5\' 8"  (1.727 m), weight 83.7 kg (184 lb 8 oz), SpO2 99 %.  PHYSICAL EXAMINATION:  Physical Exam   GENERAL:  84 y.o.-year-old elderly patient lying in the bed with no acute distress.  EYES: Pupils equal, round, reactive to light and accommodation. No scleral icterus. Extraocular muscles intact.  HEENT: Head atraumatic, normocephalic. Oropharynx and nasopharynx clear.  NG tube in place NECK:  Supple, no jugular venous distention. No thyroid enlargement, no tenderness.  LUNGS: Normal breath sounds bilaterally, no wheezing, rales,rhonchi or crepitation. No use of accessory muscles of respiration.  Decreased  bibasilar breath sounds CARDIOVASCULAR: S1, S2 normal. No murmurs, rubs, or gallops.  ABDOMEN: Abdomen is less distended, but still hypoactive bowel sounds on auscultation, non tender today. No organomegaly or mass.  EXTREMITIES: No pedal edema, cyanosis, or clubbing.  NEUROLOGIC: Cranial nerves II through XII are intact. Muscle strength 5/5 in all extremities. Sensation intact. Gait not checked.  Global weakness noted PSYCHIATRIC: The patient is alert and oriented x 3.  SKIN: No obvious rash, lesion, or ulcer.    LABORATORY PANEL:   CBC Recent Labs  Lab 08/11/17 0454  WBC 13.0*  HGB 8.2*  HCT 23.7*  PLT 197   ------------------------------------------------------------------------------------------------------------------  Chemistries  Recent Labs  Lab 08/08/17 0449 08/09/17 0459  08/11/17 0454  NA 136 140   < > 146*  K 3.3* 3.5   < > 3.1*  CL 112* 112*   < > 117*  CO2 18* 21*   < > 23  GLUCOSE 93 120*   < > 115*  BUN 21 25*   < > 27*  CREATININE 1.27* 1.62*   < > 1.37*  CALCIUM 6.3* 6.9*   < > 6.5*  MG 1.9 2.1  --   --   AST 126*  --   --   --   ALT 69*  --   --   --   ALKPHOS 75  --   --   --   BILITOT 1.1  --   --   --    < > = values in this interval not displayed.   ------------------------------------------------------------------------------------------------------------------  Cardiac Enzymes No results for input(s): TROPONINI in the last 168 hours. ------------------------------------------------------------------------------------------------------------------  RADIOLOGY:  Dg Abd 1 View  Result Date: 08/10/2017 CLINICAL DATA:  Small bowel obstruction. EXAM: ABDOMEN - 1 VIEW COMPARISON:  08/09/2017 FINDINGS: An enteric tube terminates in the region of the proximal gastric body. There is persistent diffuse small bowel dilatation which has not significantly changed. The visualized lung bases are grossly clear. No acute osseous abnormality is identified.  IMPRESSION: Unchanged small bowel dilatation consistent with obstruction. Electronically Signed   By: Logan Bores M.D.   On: 08/10/2017 08:52   Dg Abd 2 Views  Result Date: 08/11/2017 CLINICAL DATA:  Small bowel obstruction EXAM: ABDOMEN - 2 VIEW COMPARISON:  August 10, 2017 FINDINGS: Nasogastric tube tip and side port in stomach. There is significantly less bowel dilatation consistent with resolving bowel obstruction. No free air evident. Air is noted in large bowel and rectum. Visualized lung bases are clear. IMPRESSION: Nasogastric tube tip and side port in stomach. Evidence of resolving small-bowel obstruction. No new bowel dilatation. No free air. Visualized lung bases clear. Electronically Signed   By: Lowella Grip III M.D.   On: 08/11/2017 08:06    EKG:   Orders placed or performed during the hospital encounter of 08/03/17  . EKG 12-Lead  . EKG 12-Lead    ASSESSMENT AND PLAN:   84 year old male with past medical history significant for metastatic prostate cancer, hypertension and diabetes presents to hospital secondary to gross hematuria  1.  Hematuria- also with urinary retention. -Appreciate urology consult. -off anticoagulation.  Had Foley catheter with continuous bladder irrigation, however developed urinary retention once Foley was removed.  Reinserted again and now with amber-colored urine.. -Urology recommended leaving the catheter in for now  2.  Acute on chronic anemia-hemoglobin drop noted from 7.5 to 5.3 -Received 2 units transfusion on admission.  Hemoglobin at 8 now  -Monitor closely and keep hemoglobin greater than 7  3.  Neutropenic fever-neutropenia is resolving. -Improved WBC and absolute neutrophil count -afebrile.  Cultures are negative, on cefepime. Stop after 7 days -Also received recent chemotherapy.  Diarrhea with enteropathogenic E. coli on GI panel -Appreciate hematology consult  4.  Metastatic prostate cancer-oncology consulted.  Getting  chemotherapy as outpatient.  5.  Left-sided hydronephrosis-asymptomatic hydronephrosis in the past.  Follow-up renal ultrasound with no change in moderate left-sided hydronephrosis.  Also has a right lower pole renal mass. -Follow-up with urology -Patient defers placement of nephrostomy tubes at this time. -If continued Renal function worsening, CT of the abdomen also shows increasing right-sided hydronephrosis-he will need a nephrostomy tube placed - f/u renal US in 2-3 days if creatinine wornseing  6.  Abdominal distention with nausea and vomiting- secondary to bowel obstruction in RLQ- due to adhesions -CT of the abdomen confirming bowel obstruction in the right lower quadrant, secondary to adhesions. -Patient deferred surgery at this time. -KUB f/u today with improvement noted.  Also clinically improving -Continue NG tube today.  If decreased output, clamp attend to intermittent suctioning. -Appreciate surgical consult. follow-up KUBs daily. -Patient remains n.p.o. for now-  7.  DVT prophylaxis-teds and SCDs  8.  Hypokalemia and hypocalcemia-replaced appropriately.  Secondary to GI losses from NG output  Updated daughter at bedside PT consulted.  Will need home health at discharge   All the records are reviewed and case discussed with Care Management/Social Workerr. Management plans discussed with the patient, family and they are in agreement.  CODE STATUS: DNR  TOTAL TIME TAKING CARE OF THIS PATIENT: 38 minutes.   POSSIBLE D/C IN 2-3 DAYS, DEPENDING ON CLINICAL CONDITION.   Gladstone Lighter M.D on 08/11/2017 at 2:38 PM  Between 7am to 6pm - Pager - [phone]  After 6pm go to [email or web address] - password [email or web address] [facility]  Office  [phone]  CC: Primary care physician; System, Pcp Not In

## 2017-08-11 NOTE — Progress Notes (Signed)
CC: Bowel obstruction Subjective: This patient was admitted to the hospital with bowel obstruction.  The etiologies of this it is unclear as he is only had one operation the appendectomy but has had recent radiation finishing within the last 2 to 3 weeks.  He also has metastatic prostate cancer.  Patient states he feels much better today he passed some gas and had a small bowel movement.  He has had no nausea or vomiting and has no abdominal pain.  Objective: Vital signs in last 24 hours: Temp:  [97.5 F (36.4 C)] 97.5 F (36.4 C) (08/05 0521) Pulse Rate:  [73-88] 73 (08/05 0521) Resp:  [16-20] 18 (08/05 0521) BP: (119-121)/(63-67) 119/67 (08/05 0521) SpO2:  [97 %-99 %] 99 % (08/05 0521) Weight:  [184 lb 8 oz (83.7 kg)] 184 lb 8 oz (83.7 kg) (08/05 0500) Last BM Date: 08/10/17  Intake/Output from previous day: 08/04 0701 - 08/05 0700 In: 1757.5 [I.V.:1657.5; IV Piggyback:100] Out: 2000 [Urine:925; Emesis/NG OEVOJJ:0093] Intake/Output this shift: No intake/output data recorded.  Physical exam:  Vital signs reviewed. Awake alert oriented and comfortable appearing Abdomen soft less distended less tympanitic and nontender Calves are nontender No icterus no jaundice  Lab Results: CBC  Recent Labs    08/10/17 0446 08/11/17 0454  WBC 4.8 13.0*  HGB 8.0* 8.2*  HCT 23.1* 23.7*  PLT 169 197   BMET Recent Labs    08/10/17 0446 08/11/17 0454  NA 143 146*  K 3.0* 3.1*  CL 113* 117*  CO2 22 23  GLUCOSE 102* 115*  BUN 32* 27*  CREATININE 1.64* 1.37*  CALCIUM 6.7* 6.5*   PT/INR No results for input(s): LABPROT, INR in the last 72 hours. ABG No results for input(s): PHART, HCO3 in the last 72 hours.  Invalid input(s): PCO2, PO2  Studies/Results: Ct Abdomen Pelvis Wo Contrast  Result Date: 08/09/2017 CLINICAL DATA:  Hematuria. Diarrhea. Symptoms for 1 week. Receiving chemotherapy and radiation for prostate cancer. Question bowel obstruction by plain film today. EXAM:  CT ABDOMEN AND PELVIS WITHOUT CONTRAST TECHNIQUE: Multidetector CT imaging of the abdomen and pelvis was performed following the standard protocol without IV contrast. COMPARISON:  08/09/2017 KUB and CT abdomen pelvis 04/01/2017 FINDINGS: Lower chest: There is bibasilar atelectasis. Coronary artery stent or calcification present. Small pericardial effusion. Hepatobiliary: The liver is homogeneous. Layering calcified gallstones. Pancreas: Unremarkable. No pancreatic ductal dilatation or surrounding inflammatory changes. Spleen: Normal in size without focal abnormality. Adrenals/Urinary Tract: Adrenal glands are normal.There is moderate LEFT-sided hydronephrosis. The dilated LEFT ureter extends to the level of the ureterovesical junction. At the ureterovesical junction on the previous study, enhancing mass was identified. There is still mass effect in this region but the lack of intravenous contrast limits evaluation of the mass. There is mild RIGHT-sided hydronephrosis, increased compared with the prior study. Stable appearance pole renal mass, now of solid RIGHT LOWER measuring 3.9 x 3.6 centimeters and previously measuring 3.1 x 3.9 centimeters. Stable LEFT renal cyst measures 3 centimeters. Urinary bladder is decompressed by a Foley catheter. There is heterogeneous lumen of the bladder, consistent with known hematuria. Mass effect of mass in the LEFT ureterovesical junction region is again noted but not as well evaluated without intravenous contrast. Suspect mass measures at least 3.5 centimeters. Stomach/Bowel: The stomach is distended. Small bowel loops are dilated to level of the RIGHT LOWER QUADRANT where normal caliber small bowel loops are present. The precise point of obstruction is not identified. There is no mass the RIGHT LOWER QUADRANT.  The loops of colon are decompressed. Appendix is not seen. Vascular/Lymphatic: There is atherosclerotic calcification of the abdominal aorta. Suspect short-segment  dissection or penetrating ulcer in the anterior LOWER aorta, measuring 2.5 centimeters in length and 1.1 centimeters in width. This is favored to represent a chronic process. Reproductive: Prostatic calcifications are present. Other: There is fat within the LEFT inguinal ring. Anterior abdominal wall is notable for small locules of gas from recent presumed subcutaneous injections. There is bilateral minimal gynecomastia. Mild subcutaneous edema. Musculoskeletal: Significant degenerative changes in the mid lumbar spine and LOWER thoracic spine. There are sclerotic lesions associated with fractures involving the RIGHT anterior 10th, 9th, 8th, and 7th ribs. Sclerotic lesion in the LEFT anterior 7th rib. There are new faintly sclerotic lesions involving L2, the RIGHT and LEFT sacrum, and the RIGHT posterior ilium. IMPRESSION: 1. Interval increase in RIGHT-sided hydronephrosis, likely secondary to known mass within the urinary bladder. 2. Heterogeneous contents of the urinary bladder, consistent with known hematuria and mass in the region of the LEFT ureterovesical junction. This mass is not as well evaluated today on this noncontrast exam. 3. Small bowel obstruction to level of the RIGHT LOWER QUADRANT, possibly related to adhesions. No definite mass identified as a point of obstruction. 4. Solid mass in the LOWER pole the RIGHT kidney, suspicious for renal cell carcinoma. 5. New osseous metastatic disease. 6. Coronary artery disease. 7.  Aortic atherosclerosis.  (ICD10-I70.0) Electronically Signed   By: Nolon Nations M.D.   On: 08/09/2017 12:49   Dg Abd 1 View  Result Date: 08/10/2017 CLINICAL DATA:  Small bowel obstruction. EXAM: ABDOMEN - 1 VIEW COMPARISON:  08/09/2017 FINDINGS: An enteric tube terminates in the region of the proximal gastric body. There is persistent diffuse small bowel dilatation which has not significantly changed. The visualized lung bases are grossly clear. No acute osseous abnormality is  identified. IMPRESSION: Unchanged small bowel dilatation consistent with obstruction. Electronically Signed   By: Logan Bores M.D.   On: 08/10/2017 08:52   Dg Abd 1 View  Result Date: 08/09/2017 CLINICAL DATA:  Check NGT placement EXAM: ABDOMEN - 1 VIEW COMPARISON:  None. FINDINGS: Nasogastric catheter is noted within the stomach. Small bowel dilatation is again identified. IMPRESSION: Nasogastric catheter within the stomach. Electronically Signed   By: Inez Catalina M.D.   On: 08/09/2017 14:16   Dg Abd 1 View  Result Date: 08/09/2017 CLINICAL DATA:  Sepsis secondary to UTI per chart (admission). Now with vomiting. EXAM: ABDOMEN - 1 VIEW COMPARISON:  None. FINDINGS: Moderately distended gas-filled loops of small bowel throughout the abdomen and upper pelvis. Large bowel appears relatively decompressed. Most prominent small bowel distension measures 5.2 cm. No evidence of soft tissue mass or abnormal fluid collection. No evidence of free intraperitoneal air. No evidence of renal or ureteral calculi. No acute or suspicious osseous finding. IMPRESSION: Findings are consistent with small-bowel obstruction. Moderately distended gas-filled loops of small bowel are seen throughout the abdomen and upper pelvis, with most prominent small bowel distension measuring 5.2 cm. Large bowel appears relatively decompressed suggesting either high-grade partial small bowel obstruction or early complete obstruction. Recommend immediate surgical consultation if has not already been obtained. These results will be called to the ordering clinician or representative by the Radiologist Assistant, and communication documented in the PACS or zVision Dashboard. Electronically Signed   By: Franki Cabot M.D.   On: 08/09/2017 08:45    Anti-infectives: Anti-infectives (From admission, onward)   Start     Dose/Rate  Route Frequency Ordered Stop   08/06/17 1215  ceFEPIme (MAXIPIME) 2 g in sodium chloride 0.9 % 100 mL IVPB     2 g 200  mL/hr over 30 Minutes Intravenous Every 12 hours 08/06/17 1203 08/13/17 0959   08/05/17 0700  cefTRIAXone (ROCEPHIN) 1 g in sodium chloride 0.9 % 100 mL IVPB  Status:  Discontinued     1 g 200 mL/hr over 30 Minutes Intravenous Every 24 hours 08/05/17 0647 08/06/17 1157      Assessment/Plan:  White blood cell count is increased slightly. Patient is subjectively and objectively better today with a nasogastric tube in place.  Films are currently pending.  We will review those.  I discussed with him again and with his daughter-in-law last night who is a nurse on the floor, that this could be related to recent radiotherapy and changes in the bowel.  It could also be related to adhesions or tumor and that if he continues to improve and the films look better the nasogastric tube could be removed however he may also require surgery should this not improve promptly.  He is still fairly reluctant to consent to surgery at this time.  Florene Glen, MD, FACS  08/11/2017

## 2017-08-11 NOTE — Consult Note (Signed)
Pharmacy Antibiotic Note  Terry Wood. is a 84 y.o. male admitted on 08/03/2017 with sepsis secondary to UTI. Patient now has febrile neutropenia.  Pharmacy has been consulted for cefepime dosing.   Plan:  Day 6- Continue cefepime 2g IV every 12 hours for crcl 44.6 ml/min.    Height: 5\' 8"  (172.7 cm) Weight: 184 lb 8 oz (83.7 kg) IBW/kg (Calculated) : 68.4  Temp (24hrs), Avg:97.5 F (36.4 C), Min:97.5 F (36.4 C), Max:97.5 F (36.4 C)  Recent Labs  Lab 08/05/17 0722  08/07/17 0447 08/08/17 0449 08/09/17 0459 08/10/17 0446 08/11/17 0454  WBC  --    < > 0.6* 0.4* 1.3* 4.8 13.0*  CREATININE  --    < > 1.37* 1.27* 1.62* 1.64* 1.37*  LATICACIDVEN 0.8  --   --   --   --   --   --    < > = values in this interval not displayed.    Estimated Creatinine Clearance: 44.6 mL/min (A) (by C-G formula based on SCr of 1.37 mg/dL (H)).    No Known Allergies  Antimicrobials this admission: 7/30 CTX >> 7/31 7/31 cefepime >>    Microbiology results: 7/31 BCx: pending 7/31 UCx: NG Final  7/31 MRSA PCR: pending  Thank you for allowing pharmacy to be a part of this patient's care.  Noralee Space, PharmD, BCPS Clinical Pharmacist 08/11/2017 9:53 AM

## 2017-08-11 NOTE — Progress Notes (Deleted)
Called report Cecille Rubin at [facility]. Answered all questions. EMS will be called for transport once PICC line confirmed

## 2017-08-12 LAB — CBC WITH DIFFERENTIAL/PLATELET
Band Neutrophils: 2 %
Basophils Absolute: 0 10*3/uL (ref 0–0.1)
Basophils Relative: 0 %
Blasts: 0 %
Eosinophils Absolute: 0 10*3/uL (ref 0–0.7)
Eosinophils Relative: 0 %
HCT: 24.7 % — ABNORMAL LOW (ref 40.0–52.0)
Hemoglobin: 8.2 g/dL — ABNORMAL LOW (ref 13.0–18.0)
Lymphocytes Relative: 10 %
Lymphs Abs: 1.2 10*3/uL (ref 1.0–3.6)
MCH: 29.6 pg (ref 26.0–34.0)
MCHC: 33.1 g/dL (ref 32.0–36.0)
MCV: 89.5 fL (ref 80.0–100.0)
Metamyelocytes Relative: 2 %
Monocytes Absolute: 0.5 10*3/uL (ref 0.2–1.0)
Monocytes Relative: 4 %
Myelocytes: 0 %
Neutro Abs: 10.4 10*3/uL — ABNORMAL HIGH (ref 1.4–6.5)
Neutrophils Relative %: 82 %
Other: 0 %
Platelets: 238 10*3/uL (ref 150–440)
Promyelocytes Relative: 0 %
RBC: 2.76 MIL/uL — ABNORMAL LOW (ref 4.40–5.90)
RDW: 17.1 % — ABNORMAL HIGH (ref 11.5–14.5)
WBC: 12.1 10*3/uL — ABNORMAL HIGH (ref 3.8–10.6)
nRBC: 0 /100 WBC

## 2017-08-12 LAB — BASIC METABOLIC PANEL
Anion gap: 7 (ref 5–15)
BUN: 24 mg/dL — AB (ref 8–23)
CO2: 20 mmol/L — ABNORMAL LOW (ref 22–32)
CREATININE: 1.24 mg/dL (ref 0.61–1.24)
Calcium: 6.5 mg/dL — ABNORMAL LOW (ref 8.9–10.3)
Chloride: 119 mmol/L — ABNORMAL HIGH (ref 98–111)
GFR calc Af Amer: >60 mL/min (ref >60)
GFR, EST NON AFRICAN AMERICAN: 53 mL/min — AB (ref >60)
Glucose, Bld: 104 mg/dL — ABNORMAL HIGH (ref 70–99)
POTASSIUM: 3.5 mmol/L (ref 3.5–5.1)
SODIUM: 146 mmol/L — AB (ref 135–145)

## 2017-08-12 LAB — GLUCOSE, CAPILLARY
Glucose-Capillary: 104 mg/dL — ABNORMAL HIGH (ref 70–99)
Glucose-Capillary: 112 mg/dL — ABNORMAL HIGH (ref 70–99)
Glucose-Capillary: 116 mg/dL — ABNORMAL HIGH (ref 70–99)
Glucose-Capillary: 120 mg/dL — ABNORMAL HIGH (ref 70–99)

## 2017-08-12 NOTE — Progress Notes (Signed)
PT Cancellation Note  Patient Details Name: Terry Wood. MRN: [id number] DOB: 08-11-1933   Cancelled Treatment:    Reason Eval/Treat Not Completed: Patient declined, no reason specified;Other (comment)(Family requested that the patient be left to sleep since he has not had a chance to rest. PT will follow up as able.)  Lieutenant Diego PT, DPT 3:59 PM,08/12/17 [phone]

## 2017-08-12 NOTE — Progress Notes (Signed)
Urology Consult Follow Up  Subjective: Foley had to be replaced after failed TOV.  His urine is currently yellow, no hematuria or clots.  He is resting comfortably.  NG tube in place.  VSS afebrile.  Creatinine 1.24 (improved).  Hgb 8.2. (stable)   Spoke with nursing staff and they state out put was good overnight, but they did not document.  Will continue to monitor.    Anti-infectives: Anti-infectives (From admission, onward)   Start     Dose/Rate Route Frequency Ordered Stop   08/06/17 1215  ceFEPIme (MAXIPIME) 2 g in sodium chloride 0.9 % 100 mL IVPB     2 g 200 mL/hr over 30 Minutes Intravenous Every 12 hours 08/06/17 1203 08/13/17 1559   08/05/17 0700  cefTRIAXone (ROCEPHIN) 1 g in sodium chloride 0.9 % 100 mL IVPB  Status:  Discontinued     1 g 200 mL/hr over 30 Minutes Intravenous Every 24 hours 08/05/17 0647 08/06/17 1157      Current Facility-Administered Medications  Medication Dose Route Frequency Provider Last Rate Last Dose  . acetaminophen (TYLENOL) tablet 650 mg  650 mg Oral Q6H PRN Harrie Foreman, MD   650 mg at 08/08/17 1705   Or  . acetaminophen (TYLENOL) suppository 650 mg  650 mg Rectal Q6H PRN Harrie Foreman, MD      . acidophilus (RISAQUAD) capsule 1 capsule  1 capsule Oral Daily Gladstone Lighter, MD   1 capsule at 08/11/17 1017  . ceFEPIme (MAXIPIME) 2 g in sodium chloride 0.9 % 100 mL IVPB  2 g Intravenous Q12H Gladstone Lighter, MD   Stopped at 08/12/17 0400  . dextrose 5 %-0.9 % sodium chloride infusion   Intravenous Continuous Demetrios Loll, MD 75 mL/hr at 08/11/17 2247    . feeding supplement (ENSURE ENLIVE) (ENSURE ENLIVE) liquid 237 mL  237 mL Oral TID PRN Gladstone Lighter, MD      . HYDROcodone-acetaminophen (NORCO/VICODIN) 5-325 MG per tablet 1 tablet  1 tablet Oral Q4H PRN Harrie Foreman, MD   1 tablet at 08/04/17 0606  . insulin aspart (novoLOG) injection 0-9 Units  0-9 Units Subcutaneous TID WC Hillary Bow, MD   1 Units at 08/05/17 1655   . loperamide (IMODIUM) capsule 2 mg  2 mg Oral Q6H PRN Hillary Bow, MD   2 mg at 08/07/17 2044  . MEDLINE mouth rinse  15 mL Mouth Rinse BID Gladstone Lighter, MD   15 mL at 08/11/17 1056  . nicotine (NICODERM CQ - dosed in mg/24 hours) patch 21 mg  21 mg Transdermal Daily Harrie Foreman, MD   21 mg at 08/11/17 1017  . omega-3 acid ethyl esters (LOVAZA) capsule 2 g  2 g Oral BID Hillary Bow, MD   2 g at 08/11/17 1017  . promethazine (PHENERGAN) injection 12.5 mg  12.5 mg Intravenous Q6H PRN Gladstone Lighter, MD      . tamsulosin (FLOMAX) capsule 0.4 mg  0.4 mg Oral Daily Sudini, Srikar, MD   0.4 mg at 08/11/17 1017   Facility-Administered Medications Ordered in Other Encounters  Medication Dose Route Frequency Provider Last Rate Last Dose  . ondansetron (ZOFRAN) injection 8 mg  8 mg Intravenous Once Lloyd Huger, MD         Objective: Vital signs in last 24 hours: Temp:  [98.4 F (36.9 C)-98.7 F (37.1 C)] 98.7 F (37.1 C) (08/06 0429) Pulse Rate:  [75-83] 83 (08/06 0429) Resp:  [19-20] 19 (08/06 0429) BP: (127-140)/(68-74) 140/74 (  08/06 0429) SpO2:  [99 %-100 %] 99 % (08/06 0429) Weight:  [181 lb 4.8 oz (82.2 kg)] 181 lb 4.8 oz (82.2 kg) (08/06 0429)  Intake/Output from previous day: 08/05 0701 - 08/06 0700 In: 3525.8 [I.V.:1822.5; IV Piggyback:1703.3] Out: 25 [Emesis/NG output:25] Intake/Output this shift: No intake/output data recorded.   Physical Exam Constitutional: Well nourished. Alert and oriented, No acute distress. HEENT: [place] AT, moist mucus membranes. Trachea midline, no masses. Cardiovascular: No clubbing, cyanosis, or edema. Respiratory: Normal respiratory effort, no increased work of breathing. GI: Abdomen is soft, non tender, non distended, no abdominal masses. Liver and spleen not palpable.  No hernias appreciated.  Stool sample for occult testing is not indicated.   GU: No CVA tenderness.  No bladder fullness or masses.  Foley in  place. Skin: No rashes, bruises or suspicious lesions. Lymph: No cervical or inguinal adenopathy. Neurologic: Grossly intact, no focal deficits, moving all 4 extremities. Psychiatric: Normal mood and affect.  Lab Results:  Recent Labs    08/11/17 0454 08/12/17 0437  WBC 13.0* 12.1*  HGB 8.2* 8.2*  HCT 23.7* 24.7*  PLT 197 238   BMET Recent Labs    08/11/17 0454 08/12/17 0437  NA 146* 146*  K 3.1* 3.5  CL 117* 119*  CO2 23 20*  GLUCOSE 115* 104*  BUN 27* 24*  CREATININE 1.37* 1.24  CALCIUM 6.5* 6.5*   PT/INR No results for input(s): LABPROT, INR in the last 72 hours. ABG No results for input(s): PHART, HCO3 in the last 72 hours.  Invalid input(s): PCO2, PO2  Studies/Results: Dg Abd 1 View  Result Date: 08/10/2017 CLINICAL DATA:  Small bowel obstruction. EXAM: ABDOMEN - 1 VIEW COMPARISON:  08/09/2017 FINDINGS: An enteric tube terminates in the region of the proximal gastric body. There is persistent diffuse small bowel dilatation which has not significantly changed. The visualized lung bases are grossly clear. No acute osseous abnormality is identified. IMPRESSION: Unchanged small bowel dilatation consistent with obstruction. Electronically Signed   By: Logan Bores M.D.   On: 08/10/2017 08:52   Dg Abd 2 Views  Result Date: 08/11/2017 CLINICAL DATA:  Small bowel obstruction EXAM: ABDOMEN - 2 VIEW COMPARISON:  August 10, 2017 FINDINGS: Nasogastric tube tip and side port in stomach. There is significantly less bowel dilatation consistent with resolving bowel obstruction. No free air evident. Air is noted in large bowel and rectum. Visualized lung bases are clear. IMPRESSION: Nasogastric tube tip and side port in stomach. Evidence of resolving small-bowel obstruction. No new bowel dilatation. No free air. Visualized lung bases clear. Electronically Signed   By: Lowella Grip III M.D.   On: 08/11/2017 08:06     Assessment: and Plan: 1. Castrate resistant metastatic  prostate cancer Managed through the Cancer center by Dr. Grayland Ormond  2. Urinary retention Foley in place draining clear yellow urine - will need to stay in place for 3-5 days - family and patient as hesitant to have it removed Continue to trend creatinine - RUS if no improvement over the next day   3. Moderate left hydronephrosis/mild right hydronephrosis Patient has chosen conservative management at this time will consider nephrostomy tubes if renal function worsens   LOS: 8 days    [employer] [facility] 08/12/2017

## 2017-08-12 NOTE — Progress Notes (Signed)
CC: SBO Subjective: This patient with small bowel obstruction.  The etiology of which is not clear in a patient with metastatic prostate cancer.  However patient has no pain today no nausea vomiting and is passing some gas.  He had multiple bowel movements yesterday and last night.  KUB personally reviewed yesterday showed much improvement with gas throughout the colon.  Objective: Vital signs in last 24 hours: Temp:  [98.4 F (36.9 C)-98.7 F (37.1 C)] 98.7 F (37.1 C) (08/06 0429) Pulse Rate:  [75-83] 83 (08/06 0429) Resp:  [19-20] 19 (08/06 0429) BP: (127-140)/(68-74) 140/74 (08/06 0429) SpO2:  [99 %-100 %] 99 % (08/06 0429) Weight:  [181 lb 4.8 oz (82.2 kg)] 181 lb 4.8 oz (82.2 kg) (08/06 0429) Last BM Date: 08/11/17  Intake/Output from previous day: 08/05 0701 - 08/06 0700 In: 3525.8 [I.V.:1822.5; IV Piggyback:1703.3] Out: 25 [Emesis/NG output:25] Intake/Output this shift: Total I/O In: -  Out: 450 [Urine:250; Emesis/NG output:200]  Physical exam:  Vital signs reviewed and stable Abdomen is soft much less distended nontender nontympanitic.  Lab Results: CBC  Recent Labs    08/11/17 0454 08/12/17 0437  WBC 13.0* 12.1*  HGB 8.2* 8.2*  HCT 23.7* 24.7*  PLT 197 238   BMET Recent Labs    08/11/17 0454 08/12/17 0437  NA 146* 146*  K 3.1* 3.5  CL 117* 119*  CO2 23 20*  GLUCOSE 115* 104*  BUN 27* 24*  CREATININE 1.37* 1.24  CALCIUM 6.5* 6.5*   PT/INR No results for input(s): LABPROT, INR in the last 72 hours. ABG No results for input(s): PHART, HCO3 in the last 72 hours.  Invalid input(s): PCO2, PO2  Studies/Results: Dg Abd 2 Views  Result Date: 08/11/2017 CLINICAL DATA:  Small bowel obstruction EXAM: ABDOMEN - 2 VIEW COMPARISON:  August 10, 2017 FINDINGS: Nasogastric tube tip and side port in stomach. There is significantly less bowel dilatation consistent with resolving bowel obstruction. No free air evident. Air is noted in large bowel and rectum.  Visualized lung bases are clear. IMPRESSION: Nasogastric tube tip and side port in stomach. Evidence of resolving small-bowel obstruction. No new bowel dilatation. No free air. Visualized lung bases clear. Electronically Signed   By: Lowella Grip III M.D.   On: 08/11/2017 08:06    Anti-infectives: Anti-infectives (From admission, onward)   Start     Dose/Rate Route Frequency Ordered Stop   08/06/17 1215  ceFEPIme (MAXIPIME) 2 g in sodium chloride 0.9 % 100 mL IVPB     2 g 200 mL/hr over 30 Minutes Intravenous Every 12 hours 08/06/17 1203 08/13/17 1559   08/05/17 0700  cefTRIAXone (ROCEPHIN) 1 g in sodium chloride 0.9 % 100 mL IVPB  Status:  Discontinued     1 g 200 mL/hr over 30 Minutes Intravenous Every 24 hours 08/05/17 0647 08/06/17 1157      Assessment/Plan:  White blood cell count is slightly elevated.  Bicarb depressed.  No sign of sepsis however.  Abdominal exam is improved.  Nasogastric tube will be removed today and clear liquid started.  Discussed with patient and family the multifactorial nature of this are the unclear etiology of his bowel obstruction.  If this returns or recurs then surgical intervention may be indicated.  Florene Glen, MD, FACS  08/12/2017

## 2017-08-12 NOTE — Progress Notes (Signed)
[facility] [place] NAME: Terry Wood    MR#:  [id number]  DATE OF BIRTH:  July 26, 1933  SUBJECTIVE:  CHIEF COMPLAINT:   Chief Complaint  Patient presents with  . Diarrhea  . Hematuria  . Loss of Consciousness   The patient has no abdominal pain, NGT was removed this morning.  Started clear liquid diet.  He had bowel movement. REVIEW OF SYSTEMS:  Review of Systems  Constitutional: Positive for malaise/fatigue. Negative for chills and fever.  HENT: Negative for congestion, ear discharge, hearing loss and nosebleeds.   Eyes: Negative for blurred vision and double vision.  Respiratory: Negative for cough, shortness of breath and wheezing.   Cardiovascular: Negative for chest pain and palpitations.  Gastrointestinal: Negative for abdominal pain, constipation, diarrhea, nausea and vomiting.  Genitourinary: Negative for dysuria and hematuria.  Musculoskeletal: Negative for myalgias.  Neurological: Negative for dizziness, focal weakness, seizures, weakness and headaches.  Psychiatric/Behavioral: Negative for depression.    DRUG ALLERGIES:  No Known Allergies  VITALS:  Blood pressure 136/63, pulse 80, temperature 97.8 F (36.6 C), temperature source Oral, resp. rate 18, height 5\' 8"  (1.727 m), weight 181 lb 4.8 oz (82.2 kg), SpO2 99 %.  PHYSICAL EXAMINATION:  Physical Exam   GENERAL:  84 y.o.-year-old elderly patient lying in the bed with no acute distress.  EYES: Pupils equal, round, reactive to light and accommodation. No scleral icterus. Extraocular muscles intact.  HEENT: Head atraumatic, normocephalic. Oropharynx and nasopharynx clear.  NG tube in place NECK:  Supple, no jugular venous distention. No thyroid enlargement, no tenderness.  LUNGS: Normal breath sounds bilaterally, no wheezing, rales,rhonchi or crepitation. No use of accessory muscles of respiration.  Decreased bibasilar breath sounds CARDIOVASCULAR: S1, S2 normal.  No murmurs, rubs, or gallops.  ABDOMEN: Abdomen is mild distended, bowel sounds present, no tenderness. No organomegaly or mass.  EXTREMITIES: No pedal edema, cyanosis, or clubbing.  NEUROLOGIC: Cranial nerves II through XII are intact. Muscle strength 4/5 in all extremities. Sensation intact. Gait not checked.  Global weakness noted PSYCHIATRIC: The patient is alert and oriented x 3.  SKIN: No obvious rash, lesion, or ulcer.    LABORATORY PANEL:   CBC Recent Labs  Lab 08/12/17 0437  WBC 12.1*  HGB 8.2*  HCT 24.7*  PLT 238   ------------------------------------------------------------------------------------------------------------------  Chemistries  Recent Labs  Lab 08/08/17 0449 08/09/17 0459  08/12/17 0437  NA 136 140   < > 146*  K 3.3* 3.5   < > 3.5  CL 112* 112*   < > 119*  CO2 18* 21*   < > 20*  GLUCOSE 93 120*   < > 104*  BUN 21 25*   < > 24*  CREATININE 1.27* 1.62*   < > 1.24  CALCIUM 6.3* 6.9*   < > 6.5*  MG 1.9 2.1  --   --   AST 126*  --   --   --   ALT 69*  --   --   --   ALKPHOS 75  --   --   --   BILITOT 1.1  --   --   --    < > = values in this interval not displayed.   ------------------------------------------------------------------------------------------------------------------  Cardiac Enzymes No results for input(s): TROPONINI in the last 168 hours. ------------------------------------------------------------------------------------------------------------------  RADIOLOGY:  Dg Abd 2 Views  Result Date: 08/11/2017 CLINICAL DATA:  Small bowel obstruction EXAM: ABDOMEN - 2 VIEW COMPARISON:  August 10, 2017 FINDINGS: Nasogastric tube tip and side port in stomach. There is significantly less bowel dilatation consistent with resolving bowel obstruction. No free air evident. Air is noted in large bowel and rectum. Visualized lung bases are clear. IMPRESSION: Nasogastric tube tip and side port in stomach. Evidence of resolving small-bowel obstruction.  No new bowel dilatation. No free air. Visualized lung bases clear. Electronically Signed   By: Lowella Grip III M.D.   On: 08/11/2017 08:06    EKG:   Orders placed or performed during the hospital encounter of 08/03/17  . EKG 12-Lead  . EKG 12-Lead    ASSESSMENT AND PLAN:   84 year old male with past medical history significant for metastatic prostate cancer, hypertension and diabetes presents to hospital secondary to gross hematuria  1.  Hematuria- also with urinary retention. -Appreciate urology consult. -off anticoagulation.  Had Foley catheter with continuous bladder irrigation, however developed urinary retention once Foley was removed.  Reinserted again and now with amber-colored urine.. -Urology recommended leaving the catheter for 3-5 days.  2.  Acute on chronic anemia-hemoglobin drop noted from 7.5 to 5.3 -Received 2 units transfusion on admission.  Hemoglobin stable at 8.2.  3.  Neutropenic fever-neutropenia, resolved. -Improved WBC and absolute neutrophil count -afebrile.  Cultures are negative, cefepime is discontinued. -Also received recent chemotherapy.  Diarrhea with enteropathogenic E. coli on GI panel -Appreciate hematology consult  4.  Metastatic prostate cancer-oncology consulted.  Getting chemotherapy as outpatient.  5.  Left-sided hydronephrosis-asymptomatic hydronephrosis in the past.  Follow-up renal ultrasound with no change in moderate left-sided hydronephrosis.  Also has a right lower pole renal mass. -Follow-up with urology -Patient defers placement of nephrostomy tubes at this time. Renal function is improving, keep Foley catheter 3- 5 days per urologist.  6.  Abdominal distention with nausea and vomiting- secondary to bowel obstruction in RLQ- due to adhesions -CT of the abdomen confirming bowel obstruction in the right lower quadrant, secondary to adhesions. -Patient deferred surgery at this time. -KUB f/u today with improvement noted.   NG  tube is discontinued today.  Start clear liquid diet per Dr. Burt Knack.  Advance if tolerated.  7.  DVT prophylaxis-teds and SCDs  8.  Hypokalemia and hypocalcemia-replaced appropriately.  Secondary to GI losses from NG output  PT consulted.  Will need home health at discharge   All the records are reviewed and case discussed with Care Management/Social Workerr. Management plans discussed with the patient, his wife and daughter and they are in agreement.  CODE STATUS: DNR  TOTAL TIME TAKING CARE OF THIS PATIENT: 33 minutes.   POSSIBLE D/C IN 1-2 DAYS, DEPENDING ON CLINICAL CONDITION.   Demetrios Loll M.D on 08/12/2017 at 5:10 PM  Between 7am to 6pm - Pager - [phone]  After 6pm go to [email or web address] - password [email or web address] [facility]  Office  [phone]  CC: Primary care physician; System, Pcp Not In

## 2017-08-13 LAB — CBC WITH DIFFERENTIAL/PLATELET
Band Neutrophils: 0 %
Basophils Absolute: 0 10*3/uL (ref 0–0.1)
Basophils Relative: 0 %
Blasts: 0 %
Eosinophils Absolute: 0 10*3/uL (ref 0–0.7)
Eosinophils Relative: 0 %
HCT: 26.8 % — ABNORMAL LOW (ref 40.0–52.0)
Hemoglobin: 8.8 g/dL — ABNORMAL LOW (ref 13.0–18.0)
Lymphocytes Relative: 12 %
Lymphs Abs: 1.3 10*3/uL (ref 1.0–3.6)
MCH: 30.4 pg (ref 26.0–34.0)
MCHC: 33 g/dL (ref 32.0–36.0)
MCV: 92 fL (ref 80.0–100.0)
Metamyelocytes Relative: 0 %
Monocytes Absolute: 0.4 10*3/uL (ref 0.2–1.0)
Monocytes Relative: 4 %
Myelocytes: 1 %
Neutro Abs: 8.9 10*3/uL — ABNORMAL HIGH (ref 1.4–6.5)
Neutrophils Relative %: 83 %
Other: 0 %
Platelets: 259 10*3/uL (ref 150–440)
Promyelocytes Relative: 0 %
RBC: 2.91 MIL/uL — ABNORMAL LOW (ref 4.40–5.90)
RDW: 17.7 % — ABNORMAL HIGH (ref 11.5–14.5)
WBC: 10.6 10*3/uL (ref 3.8–10.6)
nRBC: 0 /100 WBC

## 2017-08-13 LAB — BASIC METABOLIC PANEL
ANION GAP: 7 (ref 5–15)
BUN: 19 mg/dL (ref 8–23)
CALCIUM: 6.1 mg/dL — AB (ref 8.9–10.3)
CHLORIDE: 119 mmol/L — AB (ref 98–111)
CO2: 19 mmol/L — AB (ref 22–32)
Creatinine, Ser: 1.24 mg/dL (ref 0.61–1.24)
GFR calc non Af Amer: 53 mL/min — ABNORMAL LOW (ref >60)
Glucose, Bld: 120 mg/dL — ABNORMAL HIGH (ref 70–99)
Potassium: 3.5 mmol/L (ref 3.5–5.1)
SODIUM: 145 mmol/L (ref 135–145)

## 2017-08-13 LAB — GLUCOSE, CAPILLARY
Glucose-Capillary: 118 mg/dL — ABNORMAL HIGH (ref 70–99)
Glucose-Capillary: 131 mg/dL — ABNORMAL HIGH (ref 70–99)
Glucose-Capillary: 105 mg/dL — ABNORMAL HIGH (ref 70–99)
Glucose-Capillary: 107 mg/dL — ABNORMAL HIGH (ref 70–99)

## 2017-08-13 LAB — MAGNESIUM: Magnesium: 1.5 mg/dL — ABNORMAL LOW (ref 1.7–2.4)

## 2017-08-13 MED ORDER — CALCIUM CARBONATE ANTACID 500 MG PO CHEW
1.0000 | CHEWABLE_TABLET | Freq: Two times a day (BID) | ORAL | Status: DC
  Administered 2017-08-13 – 2017-08-14 (×3): 200 mg via ORAL
  Filled 2017-08-13 (×3): qty 1

## 2017-08-13 MED ORDER — ONDANSETRON HCL 4 MG/2ML IJ SOLN
4.0000 mg | Freq: Once | INTRAMUSCULAR | Status: AC
  Administered 2017-08-13: 4 mg via INTRAVENOUS
  Filled 2017-08-13: qty 2

## 2017-08-13 MED ORDER — CALCIUM CARBONATE ANTACID 500 MG PO CHEW: 1.0000 | CHEWABLE_TABLET | Freq: Two times a day (BID) | ORAL | 0 refills | Status: DC

## 2017-08-13 MED ORDER — MAGNESIUM SULFATE 2 GM/50ML IV SOLN
2.0000 g | Freq: Once | INTRAVENOUS | Status: AC
  Administered 2017-08-13: 08:00:00 2 g via INTRAVENOUS
  Filled 2017-08-13 (×2): qty 50

## 2017-08-13 NOTE — Progress Notes (Signed)
Urology Consult Follow Up  Subjective: Foley had to be replaced after failed TOV.  His urine is currently yellow, no hematuria or clots.  He is resting comfortably.  NG tube was discontinued yesterday.  VSS afebrile.  Creatinine 1.24 (stable).  Hgb 8.8. (stable)   May be going home in the next couple of days if Home Health can be arranged.   Anti-infectives: Anti-infectives (From admission, onward)   Start     Dose/Rate Route Frequency Ordered Stop   08/06/17 1215  ceFEPIme (MAXIPIME) 2 g in sodium chloride 0.9 % 100 mL IVPB  Status:  Discontinued     2 g 200 mL/hr over 30 Minutes Intravenous Every 12 hours 08/06/17 1203 08/12/17 1143   08/05/17 0700  cefTRIAXone (ROCEPHIN) 1 g in sodium chloride 0.9 % 100 mL IVPB  Status:  Discontinued     1 g 200 mL/hr over 30 Minutes Intravenous Every 24 hours 08/05/17 0647 08/06/17 1157      Current Facility-Administered Medications  Medication Dose Route Frequency Provider Last Rate Last Dose  . acetaminophen (TYLENOL) tablet 650 mg  650 mg Oral Q6H PRN Harrie Foreman, MD   650 mg at 08/08/17 1705   Or  . acetaminophen (TYLENOL) suppository 650 mg  650 mg Rectal Q6H PRN Harrie Foreman, MD      . acidophilus (RISAQUAD) capsule 1 capsule  1 capsule Oral Daily [place] Lighter, MD   1 capsule at 08/13/17 0830  . calcium carbonate (TUMS - dosed in mg elemental calcium) chewable tablet 200 mg of elemental calcium  1 tablet Oral BID WC Demetrios Loll, MD   200 mg of elemental calcium at 08/13/17 0829  . dextrose 5 %-0.9 % sodium chloride infusion   Intravenous Continuous Demetrios Loll, MD 75 mL/hr at 08/13/17 0006    . feeding supplement (ENSURE ENLIVE) (ENSURE ENLIVE) liquid 237 mL  237 mL Oral TID PRN [place] Lighter, MD      . HYDROcodone-acetaminophen (NORCO/VICODIN) 5-325 MG per tablet 1 tablet  1 tablet Oral Q4H PRN Harrie Foreman, MD   1 tablet at 08/04/17 0606  . insulin aspart (novoLOG) injection 0-9 Units  0-9 Units Subcutaneous TID WC  Hillary Bow, MD   1 Units at 08/05/17 1655  . loperamide (IMODIUM) capsule 2 mg  2 mg Oral Q6H PRN Hillary Bow, MD   2 mg at 08/07/17 2044  . magnesium sulfate IVPB 2 g 50 mL  2 g Intravenous Once Demetrios Loll, MD 50 mL/hr at 08/13/17 0829 2 g at 08/13/17 0829  . MEDLINE mouth rinse  15 mL Mouth Rinse BID Gladstone Lighter, MD   15 mL at 08/11/17 1056  . nicotine (NICODERM CQ - dosed in mg/24 hours) patch 21 mg  21 mg Transdermal Daily Harrie Foreman, MD   21 mg at 08/13/17 0829  . omega-3 acid ethyl esters (LOVAZA) capsule 2 g  2 g Oral BID Hillary Bow, MD   2 g at 08/13/17 0830  . promethazine (PHENERGAN) injection 12.5 mg  12.5 mg Intravenous Q6H PRN [place] Lighter, MD      . tamsulosin (FLOMAX) capsule 0.4 mg  0.4 mg Oral Daily Hillary Bow, MD   0.4 mg at 08/13/17 0830   Facility-Administered Medications Ordered in Other Encounters  Medication Dose Route Frequency Provider Last Rate Last Dose  . ondansetron (ZOFRAN) injection 8 mg  8 mg Intravenous Once Lloyd Huger, MD         Objective: Vital signs in  last 24 hours: Temp:  [97.6 F (36.4 C)-97.8 F (36.6 C)] 97.8 F (36.6 C) (08/07 0503) Pulse Rate:  [77-90] 90 (08/07 0503) Resp:  [18-20] 18 (08/07 0503) BP: (136-148)/(63-74) 137/68 (08/07 0503) SpO2:  [99 %-100 %] 100 % (08/07 0503) Weight:  [183 lb 9.6 oz (83.3 kg)] 183 lb 9.6 oz (83.3 kg) (08/07 0503)  Intake/Output from previous day: 08/06 0701 - 08/07 0700 In: 1435 [I.V.:1435] Out: 1900 [Urine:1700; Emesis/NG output:200] Intake/Output this shift: No intake/output data recorded.   Physical Exam Constitutional: Well nourished. Alert and oriented, No acute distress. HEENT: [place] AT, moist mucus membranes. Trachea midline, no masses. Cardiovascular: No clubbing, cyanosis, or edema. Respiratory: Normal respiratory effort, no increased work of breathing. GI: Abdomen is soft, non tender, non distended, no abdominal masses. Liver and spleen not  palpable.  No hernias appreciated.  Stool sample for occult testing is not indicated.   GU: No CVA tenderness.  No bladder fullness or masses.  Foley in place.  No penile discharge. No penile lesions or rashes. Scrotum without lesions, cysts, rashes and/or edema.  Testicles are located scrotally bilaterally. No masses are appreciated in the testicles. Left and right epididymis are normal. Rectal: Not performed. Skin: No rashes, bruises or suspicious lesions. Lymph: No cervical or inguinal adenopathy. Neurologic: Grossly intact, no focal deficits, moving all 4 extremities. Psychiatric: Normal mood and affect.  Lab Results:  Recent Labs    08/12/17 0437 08/13/17 0444  WBC 12.1* 10.6  HGB 8.2* 8.8*  HCT 24.7* 26.8*  PLT 238 259   BMET Recent Labs    08/12/17 0437 08/13/17 0444  NA 146* 145  K 3.5 3.5  CL 119* 119*  CO2 20* 19*  GLUCOSE 104* 120*  BUN 24* 19  CREATININE 1.24 1.24  CALCIUM 6.5* 6.1*   PT/INR No results for input(s): LABPROT, INR in the last 72 hours. ABG No results for input(s): PHART, HCO3 in the last 72 hours.  Invalid input(s): PCO2, PO2  Studies/Results: No results found.   Assessment: and Plan: 1. Castrate resistant metastatic prostate cancer Managed through the Cancer center by Dr. Grayland Ormond  2. Urinary retention Foley in place draining clear yellow urine - will need to stay in place for 3-5 days - family and patient as hesitant to have it removed Patient to follow up as outpatient for TOV   3. Moderate left hydronephrosis/mild right hydronephrosis Patient has chosen conservative management at this time will consider nephrostomy tubes if renal function worsens   LOS: 9 days    [facility] 08/13/2017

## 2017-08-13 NOTE — Progress Notes (Signed)
Dr Bridgett Larsson made aware of critical calcium 6.1

## 2017-08-13 NOTE — Care Management Note (Addendum)
Case Management Note  Patient Details  Name: Terry Wood. MRN: [id number] Date of Birth: [birth year]  Subjective/Objective:      Admitted to [facility] with the diagnosis of acute kidney injury. Lives alone. Son is Legrand Como [phone]).  Sees Dr. Grayland Ormond as primary care physician.  Prescriptions are filled at [facility] and [facility]	[place]. No home Health. No skilled facility. No home oxygen. Semi electric bed, 2 rolling walkers, shower chair, bedside commode, and ramps. Takes care of all basic activities of daily living himself, drives. Last fall was Memorial Day weekend. Appetite getting better. Family will transport.             Action/Plan: Physical therapy evaluation completed. Recommending home with home health and supervision. Family will arrange supervision.  Discussed agencies with Mr. Bartmess and other family members. [place]. Will update Floydene Flock, [employer] representative.   Expected Discharge Date:  08/13/17               Expected Discharge Plan:     In-House Referral:   yes  Discharge planning Services   yes  Post Acute Care Choice:   yes Choice offered to:   patient and family  DME Arranged:    DME Agency:     HH Arranged:   yes HH Agency:   [place]  Status of Service:     If discussed at [place] of Stay Meetings, dates discussed:    Additional Comments:  Shelbie Ammons, RN MSN CCM Care Management [phone] 08/13/2017, 9:37 AM

## 2017-08-13 NOTE — Progress Notes (Signed)
[facility] at [place] NAME: Terry Wood    MR#:  [id number]  DATE OF BIRTH:  10-19-1933  SUBJECTIVE:  CHIEF COMPLAINT:   Chief Complaint  Patient presents with  . Diarrhea  . Hematuria  . Loss of Consciousness   The patient has no complaints.  But per RN and family member, the patient has diarrhea x 3. REVIEW OF SYSTEMS:  Review of Systems  Constitutional: Positive for malaise/fatigue. Negative for chills and fever.  HENT: Negative for congestion, ear discharge, hearing loss and nosebleeds.   Eyes: Negative for blurred vision and double vision.  Respiratory: Negative for cough, shortness of breath and wheezing.   Cardiovascular: Negative for chest pain and palpitations.  Gastrointestinal: Negative for abdominal pain, constipation, diarrhea, nausea and vomiting.  Genitourinary: Negative for dysuria and hematuria.  Musculoskeletal: Negative for myalgias.  Neurological: Negative for dizziness, focal weakness, seizures, weakness and headaches.  Psychiatric/Behavioral: Negative for depression.    DRUG ALLERGIES:  No Known Allergies  VITALS:  Blood pressure (!) 112/59, pulse 74, temperature 97.8 F (36.6 C), temperature source Oral, resp. rate (!) 24, height 5\' 8"  (1.727 m), weight 183 lb 9.6 oz (83.3 kg), SpO2 100 %.  PHYSICAL EXAMINATION:  Physical Exam   GENERAL:  84 y.o.-year-old elderly patient lying in the bed with no acute distress.  EYES: Pupils equal, round, reactive to light and accommodation. No scleral icterus. Extraocular muscles intact.  HEENT: Head atraumatic, normocephalic. Oropharynx and nasopharynx clear.  NG tube in place NECK:  Supple, no jugular venous distention. No thyroid enlargement, no tenderness.  LUNGS: Normal breath sounds bilaterally, no wheezing, rales,rhonchi or crepitation. No use of accessory muscles of respiration.  Decreased bibasilar breath sounds CARDIOVASCULAR: S1, S2 normal. No murmurs, rubs,  or gallops.  ABDOMEN: Abdomen is mild distended, bowel sounds present, no tenderness. No organomegaly or mass.  EXTREMITIES: No pedal edema, cyanosis, or clubbing.  NEUROLOGIC: Cranial nerves II through XII are intact. Muscle strength 4/5 in all extremities. Sensation intact. Gait not checked.  Global weakness noted PSYCHIATRIC: The patient is alert and oriented x 3.  SKIN: No obvious rash, lesion, or ulcer.    LABORATORY PANEL:   CBC Recent Labs  Lab 08/13/17 0444  WBC 10.6  HGB 8.8*  HCT 26.8*  PLT 259   ------------------------------------------------------------------------------------------------------------------  Chemistries  Recent Labs  Lab 08/08/17 0449  08/13/17 0444  NA 136   < > 145  K 3.3*   < > 3.5  CL 112*   < > 119*  CO2 18*   < > 19*  GLUCOSE 93   < > 120*  BUN 21   < > 19  CREATININE 1.27*   < > 1.24  CALCIUM 6.3*   < > 6.1*  MG 1.9   < > 1.5*  AST 126*  --   --   ALT 69*  --   --   ALKPHOS 75  --   --   BILITOT 1.1  --   --    < > = values in this interval not displayed.   ------------------------------------------------------------------------------------------------------------------  Cardiac Enzymes No results for input(s): TROPONINI in the last 168 hours. ------------------------------------------------------------------------------------------------------------------  RADIOLOGY:  No results found.  EKG:   Orders placed or performed during the hospital encounter of 08/03/17  . EKG 12-Lead  . EKG 12-Lead    ASSESSMENT AND PLAN:   84 year old male with past medical history significant for metastatic prostate cancer, hypertension and diabetes  presents to hospital secondary to gross hematuria  1.  Hematuria- also with urinary retention. -Appreciate urology consult. -off anticoagulation.  Had Foley catheter with continuous bladder irrigation, however developed urinary retention once Foley was removed.  Reinserted again and now with  amber-colored urine.. -Urology recommended leaving the catheter for 3-5 days.  2.  Acute on chronic anemia-hemoglobin drop noted from 7.5 to 5.3 -Received 2 units transfusion on admission.  Hemoglobin stable at 8.8.  3.  Neutropenic fever-neutropenia, resolved. -Improved WBC and absolute neutrophil count -afebrile.  Cultures are negative, cefepime is discontinued. -Also received recent chemotherapy.  Diarrhea with enteropathogenic E. coli on GI panel -Appreciate hematology consult  4.  Metastatic prostate cancer-oncology consulted.  Getting chemotherapy as outpatient.  5.  Left-sided hydronephrosis-asymptomatic hydronephrosis in the past.  Follow-up renal ultrasound with no change in moderate left-sided hydronephrosis.  Also has a right lower pole renal mass. -Follow-up with urology -Patient defers placement of nephrostomy tubes at this time. Renal function is improving, keep Foley catheter 3- 5 days per urologist.  6.  Abdominal distention with nausea and vomiting- secondary to bowel obstruction in RLQ- due to adhesions -CT of the abdomen confirming bowel obstruction in the right lower quadrant, secondary to adhesions. -Patient deferred surgery at this time. -KUB f/u with improvement noted.   NG tube is discontinued.  He tolerates full liquid diet, advance to soft diet.  7.  DVT prophylaxis-teds and SCDs  8.  Hypokalemia and hypocalcemia.  Replaced. Hypomagnesemia.  IV magnesium and follow-up level.  PT consulted.  Will need home health at discharge   All the records are reviewed and case discussed with Care Management/Social Workerr. Management plans discussed with the patient, his wife and daughter and they are in agreement.  CODE STATUS: DNR  TOTAL TIME TAKING CARE OF THIS PATIENT: 33 minutes.   POSSIBLE D/C IN 1-2 DAYS, DEPENDING ON CLINICAL CONDITION.   Demetrios Loll M.D on 08/13/2017 at 2:28 PM  Between 7am to 6pm - Pager - [phone]  After 6pm go to [email or web address] -  password [email or web address] [facility]  Office  [phone]  CC: Primary care physician; System, Pcp Not In

## 2017-08-13 NOTE — Discharge Instructions (Signed)
HHPT, keep Foley cath until seeing Dr. Erlene Quan.

## 2017-08-13 NOTE — Progress Notes (Signed)
Dr Bridgett Larsson made aware that pt passing gas but continues to have watery BMs, acknowledged, family also speaking with Dr Bridgett Larsson at this time

## 2017-08-13 NOTE — Care Management Important Message (Signed)
Important Message  Patient Details  Name: Terry Wood. MRN: [id number] Date of Birth: 1933/12/06   Medicare Important Message Given:  Yes    Shelbie Ammons, RN 08/13/2017, 11:07 AM

## 2017-08-13 NOTE — Progress Notes (Signed)
CC: Small bowel obstruction Subjective: This patient admitted to the hospital with small bowel obstruction with multifactorial possibilities.  He has completely resolved in fact he has had his NG tube out for 24 hours and is tolerating a diet passing gas and having bowel movements.  He has no abdominal pain  Objective: Vital signs in last 24 hours: Temp:  [97.6 F (36.4 C)-97.8 F (36.6 C)] 97.8 F (36.6 C) (08/07 0503) Pulse Rate:  [77-90] 90 (08/07 0503) Resp:  [18-20] 18 (08/07 0503) BP: (136-148)/(63-74) 137/68 (08/07 0503) SpO2:  [99 %-100 %] 100 % (08/07 0503) Weight:  [183 lb 9.6 oz (83.3 kg)] 183 lb 9.6 oz (83.3 kg) (08/07 0503) Last BM Date: 08/13/17  Intake/Output from previous day: 08/06 0701 - 08/07 0700 In: 1435 [I.V.:1435] Out: 1900 [Urine:1700; Emesis/NG output:200] Intake/Output this shift: No intake/output data recorded.  Physical exam:  Abdomen soft nondistended nontympanitic NG tube is out.  Lab Results: CBC  Recent Labs    08/12/17 0437 08/13/17 0444  WBC 12.1* 10.6  HGB 8.2* 8.8*  HCT 24.7* 26.8*  PLT 238 259   BMET Recent Labs    08/12/17 0437 08/13/17 0444  NA 146* 145  K 3.5 3.5  CL 119* 119*  CO2 20* 19*  GLUCOSE 104* 120*  BUN 24* 19  CREATININE 1.24 1.24  CALCIUM 6.5* 6.1*   PT/INR No results for input(s): LABPROT, INR in the last 72 hours. ABG No results for input(s): PHART, HCO3 in the last 72 hours.  Invalid input(s): PCO2, PO2  Studies/Results: No results found.  Anti-infectives: Anti-infectives (From admission, onward)   Start     Dose/Rate Route Frequency Ordered Stop   08/06/17 1215  ceFEPIme (MAXIPIME) 2 g in sodium chloride 0.9 % 100 mL IVPB  Status:  Discontinued     2 g 200 mL/hr over 30 Minutes Intravenous Every 12 hours 08/06/17 1203 08/12/17 1143   08/05/17 0700  cefTRIAXone (ROCEPHIN) 1 g in sodium chloride 0.9 % 100 mL IVPB  Status:  Discontinued     1 g 200 mL/hr over 30 Minutes Intravenous Every 24  hours 08/05/17 0647 08/06/17 1157      Assessment/Plan:  Patient doing very well at this time.  Recommend follow-up on an as-needed basis.  He is to be discharged today by the medicine service.  No need for follow-up routine at general surgery.  Florene Glen, MD, FACS  08/13/2017

## 2017-08-14 ENCOUNTER — Inpatient Hospital Stay: Payer: Medicare Other

## 2017-08-14 ENCOUNTER — Telehealth: Payer: Self-pay | Admitting: Urology

## 2017-08-14 DIAGNOSIS — R339 Retention of urine, unspecified: Secondary | ICD-10-CM

## 2017-08-14 LAB — CBC WITH DIFFERENTIAL/PLATELET
Band Neutrophils: 1 %
Basophils Absolute: 0 10*3/uL (ref 0–0.1)
Basophils Relative: 0 %
Blasts: 0 %
Eosinophils Absolute: 0 10*3/uL (ref 0–0.7)
Eosinophils Relative: 0 %
HCT: 22.6 % — ABNORMAL LOW (ref 40.0–52.0)
Hemoglobin: 7.7 g/dL — ABNORMAL LOW (ref 13.0–18.0)
Lymphocytes Relative: 12 %
Lymphs Abs: 1.1 10*3/uL (ref 1.0–3.6)
MCH: 30.3 pg (ref 26.0–34.0)
MCHC: 33.8 g/dL (ref 32.0–36.0)
MCV: 89.7 fL (ref 80.0–100.0)
Metamyelocytes Relative: 3 %
Monocytes Absolute: 0.6 10*3/uL (ref 0.2–1.0)
Monocytes Relative: 6 %
Myelocytes: 1 %
Neutro Abs: 7.6 10*3/uL — ABNORMAL HIGH (ref 1.4–6.5)
Neutrophils Relative %: 77 %
Other: 0 %
Platelets: 260 10*3/uL (ref 150–440)
Promyelocytes Relative: 0 %
RBC: 2.53 MIL/uL — ABNORMAL LOW (ref 4.40–5.90)
RDW: 16.9 % — ABNORMAL HIGH (ref 11.5–14.5)
Smear Review: ADEQUATE
WBC: 9.3 10*3/uL (ref 3.8–10.6)
nRBC: 1 /100 WBC — ABNORMAL HIGH

## 2017-08-14 LAB — GLUCOSE, CAPILLARY
Glucose-Capillary: 91 mg/dL (ref 70–99)
Glucose-Capillary: 97 mg/dL (ref 70–99)

## 2017-08-14 MED ORDER — PREMIER PROTEIN SHAKE: 11.0000 [oz_av] | Freq: Two times a day (BID) | ORAL | Status: DC

## 2017-08-14 MED ORDER — RISAQUAD PO CAPS
1.0000 | ORAL_CAPSULE | Freq: Every day | ORAL | 0 refills | Status: DC
Start: 2017-08-15 — End: 2017-12-11

## 2017-08-14 NOTE — Progress Notes (Signed)
Physical Therapy Treatment Patient Details Name: Terry Wood. MRN: [id number] DOB: 11/25/1933 Today's Date: 08/14/2017    History of Present Illness Terry Wood is an 84yo male who comes to [facility] on 7/27 after an episode of syncope and fall. PMH: metastatic PrCA (done with radiaition, cont chemostherapy as tolerated), HTN. Pt had prsistent orthostatic BP after arrival, and subsequently has a bowel obstruction, now with NG suction.      PT Comments    Pt up in chair more lively this date, finishing visit with attending physician. Pt agreeable  To progress mobility this date. He AMB 150+ft with RW at supervision, minimal signs of exercises, good gait stability, but clear need for RW for UE support. He endorses use of 4WW at baseline PTA. Pt moving well in spite of recent drop in H&H. Pt denies SOB, tachypnea, dizziness. Pt progressing well.     Follow Up Recommendations  Home health PT;Supervision/Assistance - 24 hour     Equipment Recommendations  None recommended by PT    Recommendations for Other Services       Precautions / Restrictions Precautions Precautions: Fall Restrictions Weight Bearing Restrictions: No    Mobility  Bed Mobility               General bed mobility comments: received up in chair   Transfers Overall transfer level: Needs assistance Equipment used: None Transfers: Sit to/from Stand Sit to Stand: Supervision         General transfer comment: moderate effort requires, moving surprisingly well given his lenghty stay, inactivity, and low H&H   Ambulation/Gait Ambulation/Gait assistance: Supervision Gait Distance (Feet): 155 Feet Assistive device: Rolling walker (2 wheeled) Gait Pattern/deviations: WFL(Within Functional Limits)     General Gait Details: mild weight bearing on walker but able to perform continuous step through gait, no LOB with turns or retroAMB    Stairs             Wheelchair Mobility    Modified  Rankin (Stroke Patients Only)       Balance Overall balance assessment: No apparent balance deficits (not formally assessed)                                          Cognition Arousal/Alertness: Awake/alert Behavior During Therapy: WFL for tasks assessed/performed Overall Cognitive Status: Within Functional Limits for tasks assessed                                        Exercises      General Comments        Pertinent Vitals/Pain Pain Assessment: No/denies pain    Home Living                      Prior Function            PT Goals (current goals can now be found in the care plan section) Acute Rehab PT Goals Patient Stated Goal: return to home, regain strength n basic mobility.  PT Goal Formulation: With patient Time For Goal Achievement: 08/25/17 Potential to Achieve Goals: Good Progress towards PT goals: Progressing toward goals    Frequency    Min 2X/week      PT Plan Current plan remains appropriate    Co-evaluation  AM-PAC PT "6 Clicks" Daily Activity  Outcome Measure  Difficulty turning over in bed (including adjusting bedclothes, sheets and blankets)?: A Little Difficulty moving from lying on back to sitting on the side of the bed? : A Little Difficulty sitting down on and standing up from a chair with arms (e.g., wheelchair, bedside commode, etc,.)?: A Lot Help needed moving to and from a bed to chair (including a wheelchair)?: None Help needed walking in hospital room?: None Help needed climbing 3-5 steps with a railing? : A Lot 6 Click Score: 18    End of Session Equipment Utilized During Treatment: Gait belt Activity Tolerance: Patient tolerated treatment well;Patient limited by fatigue Patient left: in chair;with family/visitor present;with call bell/phone within reach Nurse Communication: Mobility status PT Visit Diagnosis: Unsteadiness on feet (R26.81);Difficulty in walking,  not elsewhere classified (R26.2);Repeated falls (R29.6);Muscle weakness (generalized) (M62.81);History of falling (Z91.81)     Time: 1130-1140 PT Time Calculation (min) (ACUTE ONLY): 10 min  Charges:  $Therapeutic Activity: 8-22 mins                     12:13 PM, 08/14/17 Etta Grandchild, PT, DPT Physical Therapist - [facility]  [phone] ([place])     Hewlett C 08/14/2017, 12:08 PM

## 2017-08-14 NOTE — Telephone Encounter (Signed)
App made ° ° °Terry Wood °

## 2017-08-14 NOTE — Progress Notes (Signed)
Discharge instructions given and went over with patient and patients family at bedside. All questions answered. Patient discharged home. Hassani Sliney S, RN  

## 2017-08-14 NOTE — Progress Notes (Signed)
Nutrition Follow-up  DOCUMENTATION CODES:   Non-severe (moderate) malnutrition in context of chronic illness  INTERVENTION:  Provide Premier Protein po BID, each supplement provides 160 kcal and 30 grams of protein.  Continue Magic cup TID with meals, each supplement provides 290 kcal and 9 grams of protein.  NUTRITION DIAGNOSIS:   Moderate Malnutrition related to chronic illness(prostate cancer) as evidenced by mild fat depletion, mild muscle depletion, moderate muscle depletion.  Ongoing.  GOAL:   Patient will meet greater than or equal to 90% of their needs  Progressing.  MONITOR:   PO intake, Supplement acceptance, Labs, Weight trends, I & O's  REASON FOR ASSESSMENT:   Consult Poor PO  ASSESSMENT:   84 year old male with PMHx of HTN, DM, prostate cancer with retroperitoneal adenopathy on chemotherapy who is admitted with hematuria likely due to prostate cancer, anemia of chronic disease, AKI on CKD III, syncope.   -Ileus/obstructive symptoms began on 8/3. Etiology was felt to be possibly related to adhesions. No obvious mass seen. Has since resolved. -NGT was placed on 8/3 to LIS. Removed on 8/6. -Patient was made NPO on 8/3. Advanced to CLD on 8/6, FLD on 8/7, and later on 8/7 to soft diet.  Unable to meet with patient today as he was out of room. Per RN appetite slowly starting to get better. He still does not want any Ensure. Unable to tell exactly how well he has been doing with his meals as there is no meal documentation recorded since 7/29.   Medications reviewed and include: acidophilus daily, calcium carbonate BID, Novolog 0-9 units TID, Lovaza 2 grams BID, Flomax.  Labs reviewed: CBG 91-107.  Diet Order:   Diet Order            DIET SOFT Room service appropriate? Yes; Fluid consistency: Thin  Diet effective now        Diet - low sodium heart healthy              EDUCATION NEEDS:   Education needs have been addressed  Skin:  Skin Assessment:  Reviewed RN Assessment  Last BM:  08/14/2017 - large type 6  Height:   Ht Readings from Last 1 Encounters:  08/04/17 5\' 8"  (1.727 m)    Weight:   Wt Readings from Last 1 Encounters:  08/14/17 83.8 kg    Ideal Body Weight:  70 kg  BMI:  Body mass index is 28.08 kg/m.  Estimated Nutritional Needs:   Kcal:  1980-2280 (MSJ x 1.3-1.5)  Protein:  100-115 grams (1.2-1.4 grams/kg)  Fluid:  2-2.2 L/day (1 mL/kcal)  Willey Blade, MS, RD, LDN Office: [phone] Pager: [phone] After Hours/Weekend Pager: [phone]

## 2017-08-14 NOTE — Telephone Encounter (Signed)
-----   Message from Hollice Espy, MD sent at 08/14/2017  8:36 AM EDT ----- Regarding: fu plan This patient will need outpatient voiding trial with shannon or me in 1-2 weeks.  This will need to be on provider schedule.    Hollice Espy, MD

## 2017-08-14 NOTE — Discharge Summary (Signed)
Terry Wood at [place] NAME: Terry Wood    MR#:  [id number]  DATE OF BIRTH:  1934-01-08  DATE OF ADMISSION:  08/03/2017 ADMITTING PHYSICIAN: Harrie Foreman, MD  DATE OF DISCHARGE: 08/14/2017   PRIMARY CARE PHYSICIAN: System, Pcp Not In    ADMISSION DIAGNOSIS:  blood in urine  DISCHARGE DIAGNOSIS:  Active Problems:   Acute kidney injury superimposed on chronic kidney disease (HCC)   Malnutrition of moderate degree   Hydronephrosis, left   SBO (small bowel obstruction) ([place])   Urinary retention   SECONDARY DIAGNOSIS:   Past Medical History:  Diagnosis Date  . Cancer of prostate ([place]) 07/13/2014  . Diabetes mellitus without complication ([place])   . Hypertension     HOSPITAL COURSE:   84 year old male with past medical history significant for metastatic prostate cancer, hypertension and diabetes presents to hospital secondary to gross hematuria  1.  Hematuria- also with urinary retention. -Appreciate urology consult. -off anticoagulation.  Had Foley catheter with continuous bladder irrigation, however developed urinary retention once Foley was removed.  Reinserted again and now with amber-colored urine.. -Urology recommended leaving the catheter for 3-5 days. Follow in clinic.  2.  Acute on chronic anemia-hemoglobin drop noted from 7.5 to 5.3 -Received 2 units transfusion on admission.  Hemoglobin stable at 8.8.  3.  Neutropenic fever-neutropenia, resolved. -Improved WBC and absolute neutrophil count -afebrile.  Cultures are negative, cefepime is discontinued. -Also received recent chemotherapy.  Diarrhea with enteropathogenic E. coli on GI panel -Appreciate hematology consult. - lactobacelli tablets.  4.  Metastatic prostate cancer-oncology consulted.  Getting chemotherapy as outpatient.  5.  Left-sided hydronephrosis-asymptomatic hydronephrosis in the past.  Follow-up renal ultrasound with no change in  moderate left-sided hydronephrosis.  Also has a right lower pole renal mass. -Follow-up with urology -Patient defers placement of nephrostomy tubes at this time.  Renal function is improving, keep Foley catheter 3- 5 days per urologist.  6.  Abdominal distention with nausea and vomiting- secondary to bowel obstruction in RLQ- due to adhesions -CT of the abdomen confirming bowel obstruction in the right lower quadrant, secondary to adhesions. -Patient deferred surgery at this time. -KUB f/u with improvement noted.   NG tube is discontinued.  He tolerates full liquid diet, advance to soft diet.  7.  DVT prophylaxis-teds and SCDs  8.  Hypokalemia and hypocalcemia.  Replaced. Hypomagnesemia.  IV magnesium and follow-up level.  PT consulted.  Will need home health at discharge  DISCHARGE CONDITIONS:   Stable.  CONSULTS OBTAINED:  Treatment Team:  Franchot Gallo, MD Hollice Espy, MD Lequita Asal, MD Ceasar Mons, MD Florene Glen, MD  DRUG ALLERGIES:  No Known Allergies  DISCHARGE MEDICATIONS:   Allergies as of 08/14/2017   No Known Allergies     Medication List    STOP taking these medications   hydrochlorothiazide 12.5 MG tablet Commonly known as:  HYDRODIURIL     TAKE these medications   acidophilus Caps capsule Take 1 capsule by mouth daily. Start taking on:  08/15/2017   allopurinol 100 MG tablet Commonly known as:  ZYLOPRIM Take 100 mg by mouth daily.   aspirin EC 81 MG tablet Take 81 mg by mouth daily.   calcium carbonate 500 MG chewable tablet Commonly known as:  TUMS - dosed in mg elemental calcium Chew 1 tablet (200 mg of elemental calcium total) by mouth 2 (two) times daily with a meal.   Fish Oil 1000  MG Caps Take 2,000 mg by mouth 2 (two) times daily.   HYDROcodone-acetaminophen 5-325 MG tablet Commonly known as:  NORCO/VICODIN Take 1 tablet by mouth every 6 (six) hours as needed for moderate pain.   tamsulosin  0.4 MG Caps capsule Commonly known as:  FLOMAX Take 0.4 mg by mouth 2 (two) times daily.        DISCHARGE INSTRUCTIONS:    Follow with Urology in 3-4 days.  If you experience worsening of your admission symptoms, develop shortness of breath, life threatening emergency, suicidal or homicidal thoughts you must seek medical attention immediately by calling 911 or calling your MD immediately  if symptoms less severe.  You Must read complete instructions/literature along with all the possible adverse reactions/side effects for all the Medicines you take and that have been prescribed to you. Take any new Medicines after you have completely understood and accept all the possible adverse reactions/side effects.   Please note  You were cared for by a hospitalist during your hospital stay. If you have any questions about your discharge medications or the care you received while you were in the hospital after you are discharged, you can call the unit and asked to speak with the hospitalist on call if the hospitalist that took care of you is not available. Once you are discharged, your primary care physician will handle any further medical issues. Please note that NO REFILLS for any discharge medications will be authorized once you are discharged, as it is imperative that you return to your primary care physician (or establish a relationship with a primary care physician if you do not have one) for your aftercare needs so that they can reassess your need for medications and monitor your lab values.   Today   CHIEF COMPLAINT:   Chief Complaint  Patient presents with  . Diarrhea  . Hematuria  . Loss of Consciousness    HISTORY OF PRESENT ILLNESS:  Terry Wood  is a 84 y.o. male with a known history of prostate cancer and hypertension presents the emergency department after an episode of syncope.  The patient states that he became lightheaded and fell.  He did not hit his head or hurt himself.   He admits to having some diarrhea as well as hematuria.  The latter is relatively chronic.  Patient was found to be orthostatic in the emergency department and received multiple boluses of fluid but remained lightheaded mildly hypotensive which prompted the emergency department staff to call the hospitalist service for admission.  VITAL SIGNS:  Blood pressure 122/65, pulse 75, temperature (!) 97.4 F (36.3 C), temperature source Oral, resp. rate 20, height 5\' 8"  (1.727 m), weight 83.8 kg, SpO2 100 %.  I/O:    Intake/Output Summary (Last 24 hours) at 08/14/2017 1654 Last data filed at 08/14/2017 0500 Gross per 24 hour  Intake -  Output 750 ml  Net -750 ml    PHYSICAL EXAMINATION:   GENERAL:  84 y.o.-year-old elderly patient lying in the bed with no acute distress.  EYES: Pupils equal, round, reactive to light and accommodation. No scleral icterus. Extraocular muscles intact.  HEENT: Head atraumatic, normocephalic. Oropharynx and nasopharynx clear.  NG tube in place NECK:  Supple, no jugular venous distention. No thyroid enlargement, no tenderness.  LUNGS: Normal breath sounds bilaterally, no wheezing, rales,rhonchi or crepitation. No use of accessory muscles of respiration.  Decreased bibasilar breath sounds CARDIOVASCULAR: S1, S2 normal. No murmurs, rubs, or gallops.  ABDOMEN: Abdomen is mild distended, bowel  sounds present, no tenderness. No organomegaly or mass.  EXTREMITIES: No pedal edema, cyanosis, or clubbing.  NEUROLOGIC: Cranial nerves II through XII are intact. Muscle strength 4/5 in all extremities. Sensation intact. Gait not checked.  Global weakness noted PSYCHIATRIC: The patient is alert and oriented x 3.  SKIN: No obvious rash, lesion, or ulcer.   DATA REVIEW:   CBC Recent Labs  Lab 08/14/17 0440  WBC 9.3  HGB 7.7*  HCT 22.6*  PLT 260    Chemistries  Recent Labs  Lab 08/08/17 0449  08/13/17 0444  NA 136   < > 145  K 3.3*   < > 3.5  CL 112*   < > 119*  CO2  18*   < > 19*  GLUCOSE 93   < > 120*  BUN 21   < > 19  CREATININE 1.27*   < > 1.24  CALCIUM 6.3*   < > 6.1*  MG 1.9   < > 1.5*  AST 126*  --   --   ALT 69*  --   --   ALKPHOS 75  --   --   BILITOT 1.1  --   --    < > = values in this interval not displayed.    Cardiac Enzymes No results for input(s): TROPONINI in the last 168 hours.  Microbiology Results  Results for orders placed or performed during the hospital encounter of 08/03/17  Urine Culture     Status: None   Collection Time: 08/04/17 12:40 AM  Result Value Ref Range Status   Specimen Description   Final    URINE, RANDOM Performed at [facility], [address] [place], [place] [address]    Special Requests   Final    NONE Performed at [facility], [address], [place], [place] [address]    Culture   Final    NO GROWTH Performed at [facility], [place] [address] [place], [place] [address]    Report Status 08/06/2017 FINAL  Final  CULTURE, BLOOD (ROUTINE X 2) w Reflex to ID Panel     Status: None   Collection Time: 08/05/17  7:22 AM  Result Value Ref Range Status   Specimen Description BLOOD LEFT HAND  Final   Special Requests   Final    BOTTLES DRAWN AEROBIC AND ANAEROBIC Blood Culture results may not be optimal due to an excessive volume of blood received in culture bottles   Culture   Final    NO GROWTH 5 DAYS Performed at [facility], [address] [place], [place] [address]    Report Status 08/10/2017 FINAL  Final  CULTURE, BLOOD (ROUTINE X 2) w Reflex to ID Panel     Status: None   Collection Time: 08/05/17  7:23 AM  Result Value Ref Range Status   Specimen Description BLOOD RIGHT AC  Final   Special Requests   Final    BOTTLES DRAWN AEROBIC AND ANAEROBIC Blood Culture results may not be optimal due to an inadequate volume of blood received in culture bottles   Culture   Final    NO GROWTH 5 DAYS Performed at [facility], [address],  [place], [place] [address]    Report Status 08/10/2017 FINAL  Final  MRSA PCR Screening     Status: None   Collection Time: 08/06/17  4:05 PM  Result Value Ref Range Status   MRSA by PCR NEGATIVE NEGATIVE Final  Comment:        The GeneXpert MRSA Assay (FDA approved for NASAL specimens only), is one component of a comprehensive MRSA colonization surveillance program. It is not intended to diagnose MRSA infection nor to guide or monitor treatment for MRSA infections. Performed at [facility], [address] [place], [place] [address]   Gastrointestinal Panel by PCR , Stool     Status: Abnormal   Collection Time: 08/07/17  1:14 AM  Result Value Ref Range Status   Campylobacter species NOT DETECTED NOT DETECTED Final   Plesimonas shigelloides NOT DETECTED NOT DETECTED Final   Salmonella species NOT DETECTED NOT DETECTED Final   Yersinia enterocolitica NOT DETECTED NOT DETECTED Final   Vibrio species NOT DETECTED NOT DETECTED Final   Vibrio cholerae NOT DETECTED NOT DETECTED Final   Enteroaggregative E coli (EAEC) NOT DETECTED NOT DETECTED Final   Enteropathogenic E coli (EPEC) DETECTED (A) NOT DETECTED Final    Comment: RESULT CALLED TO, READ BACK BY AND VERIFIED WITH: SYLVIA FUENTES ON 08/07/17 AT 0323 JAG    Enterotoxigenic E coli (ETEC) NOT DETECTED NOT DETECTED Final   Shiga like toxin producing E coli (STEC) NOT DETECTED NOT DETECTED Final   Shigella/Enteroinvasive E coli (EIEC) NOT DETECTED NOT DETECTED Final   Cryptosporidium NOT DETECTED NOT DETECTED Final   Cyclospora cayetanensis NOT DETECTED NOT DETECTED Final   Entamoeba histolytica NOT DETECTED NOT DETECTED Final   Giardia lamblia NOT DETECTED NOT DETECTED Final   Adenovirus F40/41 NOT DETECTED NOT DETECTED Final   Astrovirus NOT DETECTED NOT DETECTED Final   Norovirus GI/GII NOT DETECTED NOT DETECTED Final   Rotavirus A NOT DETECTED NOT DETECTED Final   Sapovirus (I, II, IV, and V) NOT DETECTED NOT  DETECTED Final    Comment: Performed at [facility], [address] [place], [place] [address]  C difficile quick scan w PCR reflex     Status: None   Collection Time: 08/07/17  1:14 AM  Result Value Ref Range Status   C Diff antigen NEGATIVE NEGATIVE Final   C Diff toxin NEGATIVE NEGATIVE Final   C Diff interpretation No C. difficile detected.  Final    Comment: Performed at [facility], [address] [place], [place] [address]    RADIOLOGY:  Bea Graff 1 View  Result Date: 08/14/2017 CLINICAL DATA:  Ileus, hypertension, diabetes mellitus, prostate cancer, smoker EXAM: ABDOMEN - 1 VIEW COMPARISON:  08/11/2017 FINDINGS: Air-filled loops of upper normal to mildly dilated caliber small bowel in the mid abdomen. Scattered colonic gas. Decreased small bowel distention since previous study. Minimal rectal gas. Bones demineralized. No urinary tract calcifications. IMPRESSION: Air-filled upper normal caliber to mildly distended small bowel loops question resolving small-bowel obstruction versus ileus Electronically Signed   By: Lavonia Dana M.D.   On: 08/14/2017 16:10    EKG:   Orders placed or performed during the hospital encounter of 08/03/17  . EKG 12-Lead  . EKG 12-Lead     Management plans discussed with the patient, family and they are in agreement.  CODE STATUS: DNR    Code Status Orders  (From admission, onward)         Start     Ordered   08/04/17 1127  Do not attempt resuscitation (DNR)  Continuous    Question Answer Comment  In the event of cardiac or respiratory ARREST Do not call a "code blue"   In the event of cardiac or respiratory ARREST Do not perform  Intubation, CPR, defibrillation or ACLS   In the event of cardiac or respiratory ARREST Use medication by any route, position, wound care, and other measures to relive pain and suffering. May use oxygen, suction and manual treatment of airway obstruction as needed for comfort.      08/04/17 1126         Code Status History    Date Active Date Inactive Code Status Order ID Comments User Context   08/04/2017 0403 08/04/2017 1126 Full Code [id number]  Harrie Foreman, MD Inpatient    Advance Directive Documentation     Most Recent Value  Type of Advance Directive  Healthcare Power of [place], Living will  Pre-existing out of facility DNR order (yellow form or pink MOST form)  -  "MOST" Form in Place?  -      TOTAL TIME TAKING CARE OF THIS PATIENT: 35 minutes.    Vaughan Basta M.D on 08/14/2017 at 4:54 PM  Between 7am to 6pm - Pager - [phone]  After 6pm go to [email or web address] - password [email or web address] [facility]  Office  [phone]  CC: Primary care physician; System, Pcp Not In   Note: This dictation was prepared with Dragon dictation along with smaller phrase technology. Any transcriptional errors that result from this process are unintentional.

## 2017-08-14 NOTE — Progress Notes (Addendum)
Urology Consult Follow Up  Subjective: Foley had to be replaced after failed TOV.  His urine is currently yellow, no hematuria or clots.  He is resting comfortably.  VSS afebrile.  Creatinine 1.24 (stable).  Hgb 7.7 (decreased)   May be going home in the next couple of days if Home Health can be arranged.   Anti-infectives: Anti-infectives (From admission, onward)   Start     Dose/Rate Route Frequency Ordered Stop   08/06/17 1215  ceFEPIme (MAXIPIME) 2 g in sodium chloride 0.9 % 100 mL IVPB  Status:  Discontinued     2 g 200 mL/hr over 30 Minutes Intravenous Every 12 hours 08/06/17 1203 08/12/17 1143   08/05/17 0700  cefTRIAXone (ROCEPHIN) 1 g in sodium chloride 0.9 % 100 mL IVPB  Status:  Discontinued     1 g 200 mL/hr over 30 Minutes Intravenous Every 24 hours 08/05/17 0647 08/06/17 1157      Current Facility-Administered Medications  Medication Dose Route Frequency Provider Last Rate Last Dose  . acetaminophen (TYLENOL) tablet 650 mg  650 mg Oral Q6H PRN Harrie Foreman, MD   650 mg at 08/08/17 1705   Or  . acetaminophen (TYLENOL) suppository 650 mg  650 mg Rectal Q6H PRN Harrie Foreman, MD      . acidophilus (RISAQUAD) capsule 1 capsule  1 capsule Oral Daily Gladstone Lighter, MD   1 capsule at 08/13/17 0830  . calcium carbonate (TUMS - dosed in mg elemental calcium) chewable tablet 200 mg of elemental calcium  1 tablet Oral BID WC Demetrios Loll, MD   200 mg of elemental calcium at 08/13/17 1640  . feeding supplement (ENSURE ENLIVE) (ENSURE ENLIVE) liquid 237 mL  237 mL Oral TID PRN Gladstone Lighter, MD      . HYDROcodone-acetaminophen (NORCO/VICODIN) 5-325 MG per tablet 1 tablet  1 tablet Oral Q4H PRN Harrie Foreman, MD   1 tablet at 08/04/17 0606  . insulin aspart (novoLOG) injection 0-9 Units  0-9 Units Subcutaneous TID WC Hillary Bow, MD   1 Units at 08/13/17 1148  . loperamide (IMODIUM) capsule 2 mg  2 mg Oral Q6H PRN Hillary Bow, MD   2 mg at 08/07/17 2044  .  MEDLINE mouth rinse  15 mL Mouth Rinse BID Gladstone Lighter, MD   15 mL at 08/11/17 1056  . nicotine (NICODERM CQ - dosed in mg/24 hours) patch 21 mg  21 mg Transdermal Daily Harrie Foreman, MD   21 mg at 08/13/17 0829  . omega-3 acid ethyl esters (LOVAZA) capsule 2 g  2 g Oral BID Hillary Bow, MD   2 g at 08/13/17 0830  . promethazine (PHENERGAN) injection 12.5 mg  12.5 mg Intravenous Q6H PRN Gladstone Lighter, MD      . tamsulosin (FLOMAX) capsule 0.4 mg  0.4 mg Oral Daily Hillary Bow, MD   0.4 mg at 08/13/17 0830   Facility-Administered Medications Ordered in Other Encounters  Medication Dose Route Frequency Provider Last Rate Last Dose  . ondansetron (ZOFRAN) injection 8 mg  8 mg Intravenous Once Lloyd Huger, MD         Objective: Vital signs in last 24 hours: Temp:  [97.4 F (36.3 C)-97.9 F (36.6 C)] 97.4 F (36.3 C) (08/08 0514) Pulse Rate:  [74-85] 75 (08/08 0514) Resp:  [19-24] 20 (08/08 0514) BP: (102-122)/(59-77) 122/65 (08/08 0514) SpO2:  [100 %] 100 % (08/08 0514) Weight:  [83.8 kg] 83.8 kg (08/08 0500)  Intake/Output from previous  day: 08/07 0701 - 08/08 0700 In: -  Out: 750 [Urine:750] Intake/Output this shift: No intake/output data recorded.   Physical Exam Constitutional: Well nourished. Alert and oriented, No acute distress. HEENT: [place] AT, moist mucus membranes. Trachea midline, no masses. Cardiovascular: No clubbing, cyanosis, or edema. Respiratory: Normal respiratory effort, no increased work of breathing. GI: Abdomen is soft, non tender, non distended, no abdominal masses. Liver and spleen not palpable.  No hernias appreciated.  Stool sample for occult testing is not indicated.   GU: No CVA tenderness.  No bladder fullness or masses.  Foley in place with clear yellow urine.  Skin: No rashes, bruises or suspicious lesions. Lymph: No cervical or inguinal adenopathy. Neurologic: Grossly intact, no focal deficits, moving all 4  extremities. Psychiatric: Normal mood and affect.  Lab Results:  Recent Labs    08/13/17 0444 08/14/17 0440  WBC 10.6 9.3  HGB 8.8* 7.7*  HCT 26.8* 22.6*  PLT 259 260   BMET Recent Labs    08/12/17 0437 08/13/17 0444  NA 146* 145  K 3.5 3.5  CL 119* 119*  CO2 20* 19*  GLUCOSE 104* 120*  BUN 24* 19  CREATININE 1.24 1.24  CALCIUM 6.5* 6.1*   PT/INR No results for input(s): LABPROT, INR in the last 72 hours. ABG No results for input(s): PHART, HCO3 in the last 72 hours.  Invalid input(s): PCO2, PO2  Studies/Results: No results found.   Assessment: and Plan: 1. Castrate resistant metastatic prostate cancer Managed through the Cancer center by Dr. Grayland Ormond  2. Urinary retention Foley in place draining clear yellow urine - will need to stay in place for 3-5 days - family and patient as hesitant to have it removed Patient to follow up as outpatient for TOV   3. Moderate left hydronephrosis/mild right hydronephrosis Patient has chosen conservative management at this time will consider nephrostomy tubes if renal function worsens  4. Right renal lesion  Suspicious for renal cell carcinoma Will need outpatient follow up  5. Bladder mass Will need outpatient cystoscopy     LOS: 10 days    [facility] 08/14/2017   I have seen and examined the patient, labs/ imaging reviewed and discussed  management with Zara Council.  I reviewed the PA's note and agree with the documented findings and plan of care.  Urology will sign off, outpatient f/u has been arranged.

## 2017-08-17 NOTE — Progress Notes (Signed)
[place]  [phone]  Fax:([phone] [place] DOB: [birth year]  MR#: [id number]  WYO#:[id number]  Patient Care Team: System, Pcp Not In as PCP - General   CHIEF COMPLAINT: Prostate cancer with retroperitoneal adenopathy.  INTERVAL HISTORY: Patient returns to clinic today for hospital follow-up and discussion on whether or not to continue treatments.  His performance status has significantly declined and he is in a wheelchair today.  He has persistent weakness and fatigue.  He continues to have a poor appetite. He has no neurologic complaints.  He denies any fevers.  He denies any chest pain or shortness of breath.  He denies any nausea or vomiting.  He has no urinary complaints.  Patient feels generally terrible, but offers no further specific complaints today.  REVIEW OF SYSTEMS:   Review of Systems  Constitutional: Positive for malaise/fatigue and weight loss. Negative for diaphoresis and fever.  Respiratory: Negative.  Negative for cough and shortness of breath.   Cardiovascular: Negative.  Negative for chest pain and leg swelling.  Gastrointestinal: Negative for abdominal pain, constipation, diarrhea, nausea and vomiting.  Genitourinary: Negative.  Negative for flank pain and frequency.  Musculoskeletal: Positive for back pain and joint pain. Negative for falls.  Skin: Negative.  Negative for rash.  Neurological: Positive for weakness. Negative for dizziness, sensory change and focal weakness.  Psychiatric/Behavioral: Negative.  The patient is not nervous/anxious.     As per HPI. Otherwise, a complete review of systems is negative.  ONCOLOGY HISTORY: Oncology History   1. prostate cancer, extra capsule extention, also illiac and retroperitoneal adenopathy on ct, small lesion liver possible metastatic, minimal uptake one area t spine. Asymptomatic. Not a surgical candidate with CT findings. Considdered not bulk and not visceral  disease. Psa 13.6 prior to tx...casodex , then lupron first dose 12/31     Primary prostate cancer with metastasis from prostate to other site [facility])   06/04/2017 -  Chemotherapy    The patient had pegfilgrastim-cbqv (UDENYCA) injection 6 mg, 6 mg, Subcutaneous, Once, 2 of 5 cycles cabazitaxel (JEVTANA) 52 mg in dextrose 5 % 250 mL chemo infusion, 25 mg/m2 = 52 mg, Intravenous,  Once, 3 of 6 cycles Administration: 52 mg (07/09/2017), 52 mg (07/31/2017)  for chemotherapy treatment.      PAST MEDICAL HISTORY: Past Medical History:  Diagnosis Date  . Cancer of prostate ([place]) 07/13/2014  . Diabetes mellitus without complication ([place])   . Hypertension     PAST SURGICAL HISTORY: History reviewed. No pertinent surgical history.  FAMILY HISTORY: Reviewed and unchanged. No reported history of malignancy or chronic disease.    ADVANCED DIRECTIVES:    HEALTH MAINTENANCE: Social History   Tobacco Use  . Smoking status: Former Smoker    Types: Cigarettes    Last attempt to quit: 08/07/2017    Years since quitting: 0.0  . Smokeless tobacco: Never Used  Substance Use Topics  . Alcohol use: Not Currently    Alcohol/week: 0.0 standard drinks  . Drug use: No     No Known Allergies  Current Outpatient Medications  Medication Sig Dispense Refill  . acidophilus (RISAQUAD) CAPS capsule Take 1 capsule by mouth daily. 30 capsule 0  . allopurinol (ZYLOPRIM) 100 MG tablet Take 100 mg by mouth daily.     Marland Kitchen aspirin EC 81 MG tablet Take 81 mg by mouth daily.    . calcium carbonate (TUMS - DOSED IN MG ELEMENTAL CALCIUM) 500 MG chewable  tablet Chew 1 tablet (200 mg of elemental calcium total) by mouth 2 (two) times daily with a meal. 60 tablet 0  . hydrochlorothiazide (HYDRODIURIL) 12.5 MG tablet     . HYDROcodone-acetaminophen (NORCO/VICODIN) 5-325 MG tablet Take 1 tablet by mouth every 6 (six) hours as needed for moderate pain. 30 tablet 0  . Omega-3 Fatty Acids (FISH OIL) 1000 MG CAPS Take 2,000 mg  by mouth 2 (two) times daily.     . tamsulosin (FLOMAX) 0.4 MG CAPS capsule Take 0.4 mg by mouth 2 (two) times daily.    . nicotine (NICODERM CQ) 14 mg/24hr patch Place 1 patch (14 mg total) onto the skin daily. 28 patch 0  . predniSONE (STERAPRED UNI-PAK 21 TAB) 10 MG (21) TBPK tablet Taper as directed 21 tablet 0   No current facility-administered medications for this visit.    Facility-Administered Medications Ordered in Other Visits  Medication Dose Route Frequency Provider Last Rate Last Dose  . ondansetron (ZOFRAN) injection 8 mg  8 mg Intravenous Once Lloyd Huger, MD        OBJECTIVE: BP 101/67 (BP Location: Left Arm, Patient Position: Sitting)   Pulse 90   Temp 98 F (36.7 C) (Tympanic)   Resp 16   Ht '5\' 8"'  (1.727 m)   Wt 185 lb 14.4 oz (84.3 kg)   BMI 28.27 kg/m    Body mass index is 28.27 kg/m.    ECOG FS:2 - Symptomatic, <50% confined to bed  General: Well-developed, well-nourished, no acute distress.  Sitting in a wheelchair. Eyes: Pink conjunctiva, anicteric sclera. HEENT: Normocephalic, moist mucous membranes, clear oropharnyx. Lungs: Clear to auscultation bilaterally. Heart: Regular rate and rhythm. No rubs, murmurs, or gallops. Abdomen: Soft, nontender, nondistended. No organomegaly noted, normoactive bowel sounds. Musculoskeletal: No edema, cyanosis, or clubbing. Neuro: Alert, answering all questions appropriately. Cranial nerves grossly intact. Skin: No rashes or petechiae noted. Psych: Normal affect.  LAB RESULTS:  Orders Only on 08/21/2017  Component Date Value Ref Range Status  . Sodium 08/21/2017 137  135 - 145 mmol/L Final  . Potassium 08/21/2017 3.7  3.5 - 5.1 mmol/L Final  . Chloride 08/21/2017 106  98 - 111 mmol/L Final  . CO2 08/21/2017 21* 22 - 32 mmol/L Final  . Glucose, Bld 08/21/2017 110* 70 - 99 mg/dL Final  . BUN 08/21/2017 12  8 - 23 mg/dL Final  . Creatinine, Ser 08/21/2017 0.94  0.61 - 1.24 mg/dL Final  . Calcium 08/21/2017  6.9* 8.9 - 10.3 mg/dL Final  . Total Protein 08/21/2017 6.5  6.5 - 8.1 g/dL Final  . Albumin 08/21/2017 3.1* 3.5 - 5.0 g/dL Final  . AST 08/21/2017 31  15 - 41 U/L Final  . ALT 08/21/2017 20  0 - 44 U/L Final  . Alkaline Phosphatase 08/21/2017 77  38 - 126 U/L Final  . Total Bilirubin 08/21/2017 0.5  0.3 - 1.2 mg/dL Final  . GFR calc non Af Amer 08/21/2017 >60  >60 mL/min Final  . GFR calc Af Amer 08/21/2017 >60  >60 mL/min Final   Comment: (NOTE) The eGFR has been calculated using the CKD EPI equation. This calculation has not been validated in all clinical situations. eGFR's persistently <60 mL/min signify possible Chronic Kidney Disease.   Georgiann Hahn gap 08/21/2017 10  5 - 15 Final   Performed at [facility], [address] [place], [place] [address]  . WBC 08/21/2017 7.1  3.8 - 10.6 K/uL Final  . RBC 08/21/2017 3.05*  4.40 - 5.90 MIL/uL Final  . Hemoglobin 08/21/2017 9.3* 13.0 - 18.0 g/dL Final  . HCT 08/21/2017 28.1* 40.0 - 52.0 % Final  . MCV 08/21/2017 92.2  80.0 - 100.0 fL Final  . MCH 08/21/2017 30.4  26.0 - 34.0 pg Final  . MCHC 08/21/2017 33.0  32.0 - 36.0 g/dL Final  . RDW 08/21/2017 18.3* 11.5 - 14.5 % Final  . Platelets 08/21/2017 347  150 - 440 K/uL Final  . Neutrophils Relative % 08/21/2017 82  % Final  . Neutro Abs 08/21/2017 5.8  1.4 - 6.5 K/uL Final  . Lymphocytes Relative 08/21/2017 12  % Final  . Lymphs Abs 08/21/2017 0.8* 1.0 - 3.6 K/uL Final  . Monocytes Relative 08/21/2017 5  % Final  . Monocytes Absolute 08/21/2017 0.4  0.2 - 1.0 K/uL Final  . Eosinophils Relative 08/21/2017 0  % Final  . Eosinophils Absolute 08/21/2017 0.0  0 - 0.7 K/uL Final  . Basophils Relative 08/21/2017 1  % Final  . Basophils Absolute 08/21/2017 0.1  0 - 0.1 K/uL Final   Performed at [facility], [address] [place], [place] [address]  . Prostatic Specific Antigen 08/21/2017 27.10* 0.00 - 4.00 ng/mL Final   Comment: (NOTE) While PSA levels of <=4.0 ng/ml are  reported as reference range, some men with levels below 4.0 ng/ml can have prostate cancer and many men with PSA above 4.0 ng/ml do not have prostate cancer.  Other tests such as free PSA, age specific reference ranges, PSA velocity and PSA doubling time may be helpful especially in men less than 66 years old. Performed at [facility], [place] [address] [place], [place] [address]      STUDIES: No results found.  ASSESSMENT:  Prostate cancer with retroperitoneal adenopathy.  PLAN:     1. Prostate cancer with retroperitoneal adenopathy: CT scan and nuclear med bone scan from April 01, 2017 essentially revealed stable disease.  Patient's PSA continues to trend up, although recently has plateaued at 27.1.  Hospice and comfort care were briefly discussed today but patient does not wish to pursue this at this time.  Because of the plateau of his PSA, will reconsider treatment with cabazitaxel in 3 weeks if patient's performance status improves.  Will continue to hold Udenyca, but will consider adding it for subsequent cycles if patient becomes neutropenic.  Patient will require Zometa at his next treatment visit.   2.  Hip pain: Resolved.  Patient is completed XRT. 3.  Anemia: Patient's hemoglobin is improved to 9.3 after blood transfusion in the hospital. 4.  Hypocalcemia: Patient's calcium levels remain decreased, but stable correction for decreased albumin. 5.  Renal insufficiency: Patient's creatinine is now within normal limits. 6.  Hypokalemia: Resolved. 7.  Urinary retention: Patient had his Foley removed today.  Continue follow-up with urology as scheduled. 8.  Hypotension: Patient declined IV fluids today.   Patient expressed understanding and was in agreement with this plan. He also understands that He can call clinic at any time with any questions, concerns, or complaints.    Lloyd Huger, MD   04/26/2015 12:29 PM

## 2017-08-18 ENCOUNTER — Telehealth: Payer: Self-pay | Admitting: *Deleted

## 2017-08-18 NOTE — Telephone Encounter (Signed)
Patient opened to services this weekend and she is asking for orders for Nsg visits 1 times /wk times 1, 2 times week times 2, 1 times week times 3, and 2 as needed visits, Also HHA 2 times week. Please advise

## 2017-08-18 NOTE — Telephone Encounter (Signed)
Orders called to [facility]

## 2017-08-18 NOTE — Telephone Encounter (Signed)
Home Health Services

## 2017-08-18 NOTE — Telephone Encounter (Signed)
That's fine. What services? Home health or hospice?

## 2017-08-20 NOTE — Progress Notes (Signed)
08/21/2017 8:46 AM   Terry Wood. February 28, 1933 [id number]  Referring provider: No referring provider defined for this encounter.  Chief Complaint  Patient presents with  . Hospitalization Follow-up    HPI: Patient is an 84 year old Caucasian male with urinary retention, hematuria, metastatic prostate cancer, right renal mass and a left sided bladder mass with hydronephrosis who presents today for follow up.  He was hospitalized for AKI on CKD, hematuria, urinary retention and a small bowel obstruction and discharged on 08/14/2017.  Hematuria resolved with CBI and irrigation.  He did fail a TOV while in the hospital.  He is currently on tamsulosin.    Contrast CT in 03/2017 noted 3.1 x 3.6 x 3.8 cm exophytic right lower pole renal mass (series 4/image 27), compatible with solid renal neoplasm such as renal cell carcinoma.  2.9 x 4.1 cm left posterolateral bladder mass (series 2/image 90), grossly unchanged. Thick-walled bladder anteriorly.  Enhancing lesion along the left anterolateral prostate (series 2/image 93), likely corresponding to the patient's known prostate cancer. This is directly contiguous with the patient's left posterolateral bladder mass.  Non contrast CT on 08/09/2017 noted interval increase in RIGHT-sided hydronephrosis, moderate left hydronephrosis likely secondary to known mass within the urinary bladder.  Heterogeneous contents of the urinary bladder, consistent with known hematuria and mass in the region of the LEFT ureterovesical junction. This mass is not as well evaluated today on this noncontrast exam.  Small bowel obstruction to level of the RIGHT LOWER QUADRANT, possibly related to adhesions. No definite mass identified as a point of obstruction. solid mass in the LOWER pole the RIGHT kidney, suspicious for renal cell carcinoma.  New osseous metastatic disease.  Coronary artery disease.  Aortic atherosclerosis.   He is followed through the cancer center and  has an appointment today for his metastatic prostate cancer.  He had recently completed palliative radiation and is receiving cabazitaxel.    Creatinine was 1.24.    PMH: Past Medical History:  Diagnosis Date  . Cancer of prostate ([place]) 07/13/2014  . Diabetes mellitus without complication ([place])   . Hypertension     Surgical History: History reviewed. No pertinent surgical history.  Home Medications:  Allergies as of 08/21/2017   No Known Allergies     Medication List        Accurate as of 08/21/17  8:46 AM. Always use your most recent med list.          acidophilus Caps capsule Take 1 capsule by mouth daily.   allopurinol 100 MG tablet Commonly known as:  ZYLOPRIM Take 100 mg by mouth daily.   aspirin EC 81 MG tablet Take 81 mg by mouth daily.   calcium carbonate 500 MG chewable tablet Commonly known as:  TUMS - dosed in mg elemental calcium Chew 1 tablet (200 mg of elemental calcium total) by mouth 2 (two) times daily with a meal.   Fish Oil 1000 MG Caps Take 2,000 mg by mouth 2 (two) times daily.   hydrochlorothiazide 12.5 MG tablet Commonly known as:  HYDRODIURIL   HYDROcodone-acetaminophen 5-325 MG tablet Commonly known as:  NORCO/VICODIN Take 1 tablet by mouth every 6 (six) hours as needed for moderate pain.   tamsulosin 0.4 MG Caps capsule Commonly known as:  FLOMAX Take 0.4 mg by mouth 2 (two) times daily.       Allergies: No Known Allergies  Family History: History reviewed. No pertinent family history.  Social History:  reports that  he quit smoking about 2 weeks ago. His smoking use included cigarettes. He has never used smokeless tobacco. He reports that he drank alcohol. He reports that he does not use drugs.  ROS: UROLOGY Frequent Urination?: No Hard to postpone urination?: No Burning/pain with urination?: No Get up at night to urinate?: Yes Leakage of urine?: No Urine stream starts and stops?: No Trouble starting stream?: No Do you  have to strain to urinate?: No Blood in urine?: Yes Urinary tract infection?: No Sexually transmitted disease?: No Injury to kidneys or bladder?: No Painful intercourse?: No Weak stream?: No Erection problems?: No Penile pain?: No  Gastrointestinal Nausea?: No Vomiting?: No Indigestion/heartburn?: No Diarrhea?: No Constipation?: No  Constitutional Fever: No Night sweats?: No Weight loss?: No Fatigue?: Yes  Skin Skin rash/lesions?: No Itching?: No  Eyes Blurred vision?: No Double vision?: No  Ears/Nose/Throat Sore throat?: No Sinus problems?: No  Hematologic/Lymphatic Swollen glands?: No Easy bruising?: Yes  Cardiovascular Leg swelling?: Yes Chest pain?: No  Respiratory Cough?: No Shortness of breath?: No  Endocrine Excessive thirst?: No  Musculoskeletal Back pain?: No Joint pain?: No  Neurological Headaches?: No Dizziness?: No  Psychologic Depression?: No Anxiety?: No  Physical Exam: BP 98/62 (BP Location: Left Arm, Patient Position: Sitting, Cuff Size: Large)   Pulse 84   Ht 5\' 8"  (1.727 m)   BMI 28.08 kg/m   Constitutional:  Well nourished. Alert and oriented, No acute distress. HEENT: [place] AT, moist mucus membranes.  Trachea midline, no masses. Cardiovascular: No clubbing, cyanosis, or edema. Respiratory: Normal respiratory effort, no increased work of breathing. Skin: No rashes, bruises or suspicious lesions. Lymph: No cervical or inguinal adenopathy. Neurologic: Grossly intact, no focal deficits, moving all 4 extremities. Psychiatric: Normal mood and affect.  Laboratory Data: Lab Results  Component Value Date   WBC 9.3 08/14/2017   HGB 7.7 (L) 08/14/2017   HCT 22.6 (L) 08/14/2017   MCV 89.7 08/14/2017   PLT 260 08/14/2017    Lab Results  Component Value Date   CREATININE 1.24 08/13/2017    Lab Results  Component Value Date   PSA 0.95 05/21/2016   PSA 0.28 02/22/2016   PSA 0.19 11/21/2015    No results found for:  TESTOSTERONE  Lab Results  Component Value Date   HGBA1C 6.7 (H) 08/03/2017    Lab Results  Component Value Date   TSH 1.260 08/03/2017    No results found for: CHOL, HDL, CHOLHDL, VLDL, LDLCALC  Lab Results  Component Value Date   AST 126 (H) 08/08/2017   Lab Results  Component Value Date   ALT 69 (H) 08/08/2017   No components found for: ALKALINEPHOPHATASE No components found for: BILIRUBINTOTAL  No results found for: ESTRADIOL  Urinalysis    Component Value Date/Time   COLORURINE RED (A) 08/04/2017 0040   APPEARANCEUR CLOUDY (A) 08/04/2017 0040   LABSPEC 1.033 (H) 08/04/2017 0040   PHURINE 7.0 08/04/2017 0040   GLUCOSEU 150 (A) 08/04/2017 0040   HGBUR MODERATE (A) 08/04/2017 0040   BILIRUBINUR NEGATIVE 08/04/2017 0040   KETONESUR NEGATIVE 08/04/2017 0040   PROTEINUR 100 (A) 08/04/2017 0040   NITRITE NEGATIVE 08/04/2017 0040   LEUKOCYTESUR NEGATIVE 08/04/2017 0040    I have reviewed the labs.  Assessment & Plan:    1. Acute urinary retention:    Foley catheter removed Voiding trial today   Return if unable to urinate or experiencing suprapubic discomfort Follow-up in one month for I PSS score, PVR and exam.  2. Johney Maine  hematuria Urine has been pink tinged Will continue to monitor - will report any gross hematuria or clots  3. Metastatic prostate cancer Managed through the Cancer center  4. Left hydronephrosis Asymptomatic left hydronephrosis since at least 3/22019 which does not appear to have been address Still present on RUS in 07/2017 Patient did not want any intervention unless he started to experience pain and/or a decrease in renal function   5. Right renal mass 3.8 cm right renal mass, slow interval growth Recommend continued observation Repeat imaging in 6 months    6. Bladder mass Most likely prostate cancer infiltrating the bladder May consider cystoscopy in the future if hematuria persists   Return in about 1 month (around  09/21/2017) for IPSS and PVR.  These notes generated with voice recognition software. I apologize for typographical errors.  Zara Council, PA-C  [facility] [address]  [place], [place] [address] [phone]

## 2017-08-21 ENCOUNTER — Inpatient Hospital Stay: Payer: Medicare Other | Attending: Oncology

## 2017-08-21 ENCOUNTER — Encounter: Payer: Self-pay | Admitting: Oncology

## 2017-08-21 ENCOUNTER — Encounter: Payer: Self-pay | Admitting: Urology

## 2017-08-21 ENCOUNTER — Other Ambulatory Visit: Payer: Self-pay

## 2017-08-21 ENCOUNTER — Inpatient Hospital Stay (HOSPITAL_BASED_OUTPATIENT_CLINIC_OR_DEPARTMENT_OTHER): Payer: Medicare Other | Admitting: Oncology

## 2017-08-21 ENCOUNTER — Ambulatory Visit (INDEPENDENT_AMBULATORY_CARE_PROVIDER_SITE_OTHER): Payer: Medicare Other | Admitting: Urology

## 2017-08-21 ENCOUNTER — Inpatient Hospital Stay: Payer: Medicare Other

## 2017-08-21 VITALS — BP 98/62 | HR 84 | Ht 68.0 in

## 2017-08-21 VITALS — BP 101/67 | HR 90 | Temp 98.0°F | Resp 16 | Ht 68.0 in | Wt 185.9 lb

## 2017-08-21 DIAGNOSIS — Z923 Personal history of irradiation: Secondary | ICD-10-CM | POA: Diagnosis not present

## 2017-08-21 DIAGNOSIS — C61 Malignant neoplasm of prostate: Secondary | ICD-10-CM

## 2017-08-21 DIAGNOSIS — R531 Weakness: Secondary | ICD-10-CM | POA: Diagnosis not present

## 2017-08-21 DIAGNOSIS — Z7982 Long term (current) use of aspirin: Secondary | ICD-10-CM

## 2017-08-21 DIAGNOSIS — R530 Neoplastic (malignant) related fatigue: Secondary | ICD-10-CM | POA: Insufficient documentation

## 2017-08-21 DIAGNOSIS — Z87891 Personal history of nicotine dependence: Secondary | ICD-10-CM | POA: Diagnosis not present

## 2017-08-21 DIAGNOSIS — Z79899 Other long term (current) drug therapy: Secondary | ICD-10-CM | POA: Insufficient documentation

## 2017-08-21 DIAGNOSIS — N1330 Unspecified hydronephrosis: Secondary | ICD-10-CM

## 2017-08-21 DIAGNOSIS — N2889 Other specified disorders of kidney and ureter: Secondary | ICD-10-CM

## 2017-08-21 DIAGNOSIS — R310 Gross hematuria: Secondary | ICD-10-CM | POA: Diagnosis not present

## 2017-08-21 DIAGNOSIS — I959 Hypotension, unspecified: Secondary | ICD-10-CM

## 2017-08-21 DIAGNOSIS — R338 Other retention of urine: Secondary | ICD-10-CM | POA: Diagnosis not present

## 2017-08-21 DIAGNOSIS — E46 Unspecified protein-calorie malnutrition: Secondary | ICD-10-CM

## 2017-08-21 DIAGNOSIS — D649 Anemia, unspecified: Secondary | ICD-10-CM | POA: Diagnosis not present

## 2017-08-21 DIAGNOSIS — N3289 Other specified disorders of bladder: Secondary | ICD-10-CM

## 2017-08-21 DIAGNOSIS — R634 Abnormal weight loss: Secondary | ICD-10-CM

## 2017-08-21 LAB — COMPREHENSIVE METABOLIC PANEL
ALBUMIN: 3.1 g/dL — AB (ref 3.5–5.0)
ALK PHOS: 77 U/L (ref 38–126)
ALT: 20 U/L (ref 0–44)
AST: 31 U/L (ref 15–41)
Anion gap: 10 (ref 5–15)
BUN: 12 mg/dL (ref 8–23)
CALCIUM: 6.9 mg/dL — AB (ref 8.9–10.3)
CO2: 21 mmol/L — ABNORMAL LOW (ref 22–32)
Chloride: 106 mmol/L (ref 98–111)
Creatinine, Ser: 0.94 mg/dL (ref 0.61–1.24)
GFR calc Af Amer: >60 mL/min (ref >60)
GFR calc non Af Amer: >60 mL/min (ref >60)
GLUCOSE: 110 mg/dL — AB (ref 70–99)
Potassium: 3.7 mmol/L (ref 3.5–5.1)
Sodium: 137 mmol/L (ref 135–145)
TOTAL PROTEIN: 6.5 g/dL (ref 6.5–8.1)
Total Bilirubin: 0.5 mg/dL (ref 0.3–1.2)

## 2017-08-21 LAB — CBC WITH DIFFERENTIAL/PLATELET
BASOS ABS: 0.1 10*3/uL (ref 0–0.1)
BASOS PCT: 1 %
EOS PCT: 0 %
Eosinophils Absolute: 0 10*3/uL (ref 0–0.7)
HCT: 28.1 % — ABNORMAL LOW (ref 40.0–52.0)
Hemoglobin: 9.3 g/dL — ABNORMAL LOW (ref 13.0–18.0)
Lymphocytes Relative: 12 %
Lymphs Abs: 0.8 10*3/uL — ABNORMAL LOW (ref 1.0–3.6)
MCH: 30.4 pg (ref 26.0–34.0)
MCHC: 33 g/dL (ref 32.0–36.0)
MCV: 92.2 fL (ref 80.0–100.0)
MONO ABS: 0.4 10*3/uL (ref 0.2–1.0)
Monocytes Relative: 5 %
Neutro Abs: 5.8 10*3/uL (ref 1.4–6.5)
Neutrophils Relative %: 82 %
PLATELETS: 347 10*3/uL (ref 150–440)
RBC: 3.05 MIL/uL — ABNORMAL LOW (ref 4.40–5.90)
RDW: 18.3 % — AB (ref 11.5–14.5)
WBC: 7.1 10*3/uL (ref 3.8–10.6)

## 2017-08-21 LAB — PSA: Prostatic Specific Antigen: 27.1 ng/mL — ABNORMAL HIGH (ref 0.00–4.00)

## 2017-08-21 MED ORDER — PREDNISONE 10 MG (21) PO TBPK: ORAL_TABLET | ORAL | 0 refills | Status: DC

## 2017-08-22 ENCOUNTER — Encounter: Payer: Self-pay | Admitting: Oncology

## 2017-08-22 ENCOUNTER — Other Ambulatory Visit: Payer: Self-pay | Admitting: *Deleted

## 2017-08-22 MED ORDER — NICOTINE 14 MG/24HR TD PT24: 14.0000 mg | MEDICATED_PATCH | Freq: Every day | TRANSDERMAL | 0 refills | Status: DC

## 2017-08-27 ENCOUNTER — Ambulatory Visit: Payer: Medicare Other | Admitting: Radiation Oncology

## 2017-08-27 ENCOUNTER — Inpatient Hospital Stay: Payer: Self-pay | Admitting: Surgery

## 2017-09-04 ENCOUNTER — Ambulatory Visit: Payer: Medicare Other

## 2017-09-04 VITALS — BP 96/58 | HR 132 | Ht 68.0 in | Wt 181.0 lb

## 2017-09-04 DIAGNOSIS — R300 Dysuria: Secondary | ICD-10-CM

## 2017-09-04 LAB — URINALYSIS, COMPLETE
Bilirubin, UA: NEGATIVE
GLUCOSE, UA: NEGATIVE
KETONES UA: NEGATIVE
Nitrite, UA: NEGATIVE
SPEC GRAV UA: 1.015 (ref 1.005–1.030)
Urobilinogen, Ur: 0.2 mg/dL (ref 0.2–1.0)
pH, UA: 7 (ref 5.0–7.5)

## 2017-09-04 LAB — MICROSCOPIC EXAMINATION: Epithelial Cells (non renal): NONE SEEN /hpf (ref 0–10)

## 2017-09-04 MED ORDER — SULFAMETHOXAZOLE-TRIMETHOPRIM 800-160 MG PO TABS: 1.0000 | ORAL_TABLET | Freq: Two times a day (BID) | ORAL | 0 refills | Status: DC

## 2017-09-04 NOTE — Progress Notes (Signed)
Pt presents in clinic for UTI sx, pt c/o burning, stinging, and pain with urination. Pt states that these symptoms began gradually and have increasingly gotten worse. UA and culture performed. Results reviewed by provider and appropriate antibiotic sent in. Advised pt to increase hydration, may take AZO prn.

## 2017-09-06 LAB — CULTURE, URINE COMPREHENSIVE

## 2017-09-08 ENCOUNTER — Other Ambulatory Visit: Payer: Self-pay | Admitting: Oncology

## 2017-09-08 NOTE — Progress Notes (Signed)
[place]  [phone]  Fax:([phone]) [place] DOB: 1933-04-24  MR#: [id number]  AXK#:[id number]  Patient Care Team: System, Pcp Not In as PCP - General   CHIEF COMPLAINT: Prostate cancer with retroperitoneal adenopathy.  INTERVAL HISTORY: Patient returns to clinic today for further evaluation, laboratory work, and discussion on whether to continue with treatment or discontinue altogether.  He feels significantly improved over the last 3 weeks, although admits to continued chronic weakness and fatigue.  His appetite has mildly improved.  He does not complain of pain today.  He has no neurologic complaints.  He denies any fevers.  He denies any chest pain or shortness of breath.  He denies any nausea or vomiting.  He has no urinary complaints.  Patient offers no further specific complaints today.    REVIEW OF SYSTEMS:   Review of Systems  Constitutional: Positive for malaise/fatigue. Negative for diaphoresis, fever and weight loss.  Respiratory: Negative.  Negative for cough and shortness of breath.   Cardiovascular: Negative.  Negative for chest pain and leg swelling.  Gastrointestinal: Negative for abdominal pain, constipation, diarrhea, nausea and vomiting.  Genitourinary: Negative.  Negative for flank pain and frequency.  Musculoskeletal: Negative.  Negative for back pain, falls and joint pain.  Skin: Negative.  Negative for rash.  Neurological: Positive for weakness. Negative for dizziness, sensory change and focal weakness.  Psychiatric/Behavioral: Negative.  The patient is not nervous/anxious.     As per HPI. Otherwise, a complete review of systems is negative.  ONCOLOGY HISTORY: Oncology History   1. prostate cancer, extra capsule extention, also illiac and retroperitoneal adenopathy on ct, small lesion liver possible metastatic, minimal uptake one area t spine. Asymptomatic. Not a surgical candidate with CT findings. Considdered  not bulk and not visceral disease. Psa 13.6 prior to tx...casodex , then lupron first dose 12/31     Primary prostate cancer with metastasis from prostate to other site [facility])   06/04/2017 -  Chemotherapy    The patient had pegfilgrastim-cbqv (UDENYCA) injection 6 mg, 6 mg, Subcutaneous, Once, 2 of 5 cycles cabazitaxel (JEVTANA) 52 mg in dextrose 5 % 250 mL chemo infusion, 25 mg/m2 = 52 mg, Intravenous,  Once, 3 of 6 cycles Administration: 52 mg (07/09/2017), 52 mg (07/31/2017)  for chemotherapy treatment.      PAST MEDICAL HISTORY: Past Medical History:  Diagnosis Date  . Cancer of prostate ([place]) 07/13/2014  . Diabetes mellitus without complication ([place])   . Hypertension     PAST SURGICAL HISTORY: No past surgical history on file.  FAMILY HISTORY: Reviewed and unchanged. No reported history of malignancy or chronic disease.    ADVANCED DIRECTIVES:    HEALTH MAINTENANCE: Social History   Tobacco Use  . Smoking status: Former Smoker    Types: Cigarettes    Last attempt to quit: 08/07/2017    Years since quitting: 0.1  . Smokeless tobacco: Never Used  Substance Use Topics  . Alcohol use: Not Currently    Alcohol/week: 0.0 standard drinks  . Drug use: No     No Known Allergies  Current Outpatient Medications  Medication Sig Dispense Refill  . acidophilus (RISAQUAD) CAPS capsule Take 1 capsule by mouth daily. 30 capsule 0  . allopurinol (ZYLOPRIM) 100 MG tablet Take 100 mg by mouth daily.     Marland Kitchen aspirin EC 81 MG tablet Take 81 mg by mouth daily.    . calcium carbonate (TUMS - DOSED IN MG  ELEMENTAL CALCIUM) 500 MG chewable tablet Chew 1 tablet (200 mg of elemental calcium total) by mouth 2 (two) times daily with a meal. 60 tablet 0  . Omega-3 Fatty Acids (FISH OIL) 1000 MG CAPS Take 2,000 mg by mouth 2 (two) times daily.     . tamsulosin (FLOMAX) 0.4 MG CAPS capsule Take 0.4 mg by mouth 2 (two) times daily.    Marland Kitchen HYDROcodone-acetaminophen (NORCO/VICODIN) 5-325 MG tablet Take 1  tablet by mouth every 6 (six) hours as needed for moderate pain. (Patient not taking: Reported on 09/11/2017) 30 tablet 0   No current facility-administered medications for this visit.    Facility-Administered Medications Ordered in Other Visits  Medication Dose Route Frequency Provider Last Rate Last Dose  . ondansetron (ZOFRAN) injection 8 mg  8 mg Intravenous Once Lloyd Huger, MD        OBJECTIVE: BP (!) 105/52 (BP Location: Left Arm, Patient Position: Sitting)   Pulse 97   Temp (!) 96.7 F (35.9 C) (Tympanic)   Resp 18   Wt 178 lb 9.6 oz (81 kg)   BMI 27.16 kg/m    Body mass index is 27.16 kg/m.    ECOG FS:1 - Symptomatic but completely ambulatory  General: Well-developed, well-nourished, no acute distress. Eyes: Pink conjunctiva, anicteric sclera. HEENT: Normocephalic, moist mucous membranes. Lungs: Clear to auscultation bilaterally. Heart: Regular rate and rhythm. No rubs, murmurs, or gallops. Abdomen: Soft, nontender, nondistended. No organomegaly noted, normoactive bowel sounds. Musculoskeletal: No edema, cyanosis, or clubbing. Neuro: Alert, answering all questions appropriately. Cranial nerves grossly intact. Skin: No rashes or petechiae noted. Psych: Normal affect.  LAB RESULTS:  Appointment on 09/11/2017  Component Date Value Ref Range Status  . Sodium 09/11/2017 137  135 - 145 mmol/L Final  . Potassium 09/11/2017 4.2  3.5 - 5.1 mmol/L Final  . Chloride 09/11/2017 109  98 - 111 mmol/L Final  . CO2 09/11/2017 20* 22 - 32 mmol/L Final  . Glucose, Bld 09/11/2017 110* 70 - 99 mg/dL Final  . BUN 09/11/2017 13  8 - 23 mg/dL Final  . Creatinine, Ser 09/11/2017 1.61* 0.61 - 1.24 mg/dL Final  . Calcium 09/11/2017 8.5* 8.9 - 10.3 mg/dL Final  . Total Protein 09/11/2017 6.6  6.5 - 8.1 g/dL Final  . Albumin 09/11/2017 3.5  3.5 - 5.0 g/dL Final  . AST 09/11/2017 19  15 - 41 U/L Final  . ALT 09/11/2017 11  0 - 44 U/L Final  . Alkaline Phosphatase 09/11/2017 65  38 -  126 U/L Final  . Total Bilirubin 09/11/2017 0.7  0.3 - 1.2 mg/dL Final  . GFR calc non Af Amer 09/11/2017 38* >60 mL/min Final  . GFR calc Af Amer 09/11/2017 45* >60 mL/min Final   Comment: (NOTE) The eGFR has been calculated using the CKD EPI equation. This calculation has not been validated in all clinical situations. eGFR's persistently <60 mL/min signify possible Chronic Kidney Disease.   Georgiann Hahn gap 09/11/2017 8  5 - 15 Final   Performed at [facility], [address] [place], [place] [address]  . WBC 09/11/2017 5.6  3.8 - 10.6 K/uL Final  . RBC 09/11/2017 2.91* 4.40 - 5.90 MIL/uL Final  . Hemoglobin 09/11/2017 9.3* 13.0 - 18.0 g/dL Final  . HCT 09/11/2017 29.1* 40.0 - 52.0 % Final  . MCV 09/11/2017 99.9  80.0 - 100.0 fL Final  . MCH 09/11/2017 32.0  26.0 - 34.0 pg Final  . MCHC 09/11/2017 32.0  32.0 -  36.0 g/dL Final  . RDW 09/11/2017 20.5* 11.5 - 14.5 % Final  . Platelets 09/11/2017 223  150 - 440 K/uL Final  . Neutrophils Relative % 09/11/2017 75  % Final  . Neutro Abs 09/11/2017 4.1  1.4 - 6.5 K/uL Final  . Lymphocytes Relative 09/11/2017 16  % Final  . Lymphs Abs 09/11/2017 0.9* 1.0 - 3.6 K/uL Final  . Monocytes Relative 09/11/2017 4  % Final  . Monocytes Absolute 09/11/2017 0.2  0.2 - 1.0 K/uL Final  . Eosinophils Relative 09/11/2017 5  % Final  . Eosinophils Absolute 09/11/2017 0.3  0 - 0.7 K/uL Final  . Basophils Relative 09/11/2017 0  % Final  . Basophils Absolute 09/11/2017 0.0  0 - 0.1 K/uL Final   Performed at [facility], [address] [place], [place] [address]  . Prostatic Specific Antigen 09/11/2017 18.25* 0.00 - 4.00 ng/mL Final   Comment: (NOTE) While PSA levels of <=4.0 ng/ml are reported as reference range, some men with levels below 4.0 ng/ml can have prostate cancer and many men with PSA above 4.0 ng/ml do not have prostate cancer.  Other tests such as free PSA, age specific reference ranges, PSA velocity and PSA doubling time may be  helpful especially in men less than 78 years old. Performed at [facility], [place] [address], [place], [place] [address]      STUDIES: No results found.  ASSESSMENT:  Prostate cancer with retroperitoneal adenopathy.  PLAN:     1. Prostate cancer with retroperitoneal adenopathy: CT scan and nuclear med bone scan from April 01, 2017 essentially revealed stable disease.  Despite the fact that his last treatment with cabazitaxel was on July 31, 2017, patient's PSA is trending down and is now 18.25.  Patient has elected to not pursue any further treatments at this time.  Hospice and end-of-life care were discussed, but patient is not ready to enroll.  He has been instructed that if he changes his mind regarding reinitiation of treatment to call clinic.  Otherwise, no follow-up has been scheduled at this time.   2.  Hip pain: Resolved.  Patient completed XRT.  3.  Anemia: Hemoglobin is decreased, but stable at 9.3.  4.  Hypocalcemia: Improved and nearly normal at 8.5 today.   5.  Renal insufficiency: Creatinine has trended down slightly. 6.  Hypokalemia: Resolved. 7.  Urinary retention: No further issues.  I spent a total of 30 minutes face-to-face with the patient of which greater than 50% of the visit was spent in counseling and coordination of care as detailed above.  Patient expressed understanding and was in agreement with this plan. He also understands that He can call clinic at any time with any questions, concerns, or complaints.    Lloyd Huger, MD   04/26/2015 12:13 PM

## 2017-09-11 ENCOUNTER — Other Ambulatory Visit: Payer: Self-pay

## 2017-09-11 ENCOUNTER — Other Ambulatory Visit: Payer: Medicare Other

## 2017-09-11 ENCOUNTER — Inpatient Hospital Stay: Payer: Medicare Other | Attending: Oncology

## 2017-09-11 ENCOUNTER — Ambulatory Visit: Payer: Medicare Other | Admitting: Oncology

## 2017-09-11 ENCOUNTER — Inpatient Hospital Stay (HOSPITAL_BASED_OUTPATIENT_CLINIC_OR_DEPARTMENT_OTHER): Payer: Medicare Other | Admitting: Oncology

## 2017-09-11 ENCOUNTER — Inpatient Hospital Stay: Payer: Medicare Other

## 2017-09-11 VITALS — BP 105/52 | HR 97 | Temp 96.7°F | Resp 18 | Wt 178.6 lb

## 2017-09-11 DIAGNOSIS — R531 Weakness: Secondary | ICD-10-CM | POA: Diagnosis not present

## 2017-09-11 DIAGNOSIS — R590 Localized enlarged lymph nodes: Secondary | ICD-10-CM | POA: Diagnosis not present

## 2017-09-11 DIAGNOSIS — N289 Disorder of kidney and ureter, unspecified: Secondary | ICD-10-CM | POA: Diagnosis not present

## 2017-09-11 DIAGNOSIS — C61 Malignant neoplasm of prostate: Secondary | ICD-10-CM

## 2017-09-11 DIAGNOSIS — Z87891 Personal history of nicotine dependence: Secondary | ICD-10-CM | POA: Diagnosis not present

## 2017-09-11 DIAGNOSIS — Z7982 Long term (current) use of aspirin: Secondary | ICD-10-CM

## 2017-09-11 DIAGNOSIS — D649 Anemia, unspecified: Secondary | ICD-10-CM | POA: Diagnosis not present

## 2017-09-11 DIAGNOSIS — Z923 Personal history of irradiation: Secondary | ICD-10-CM

## 2017-09-11 DIAGNOSIS — Z79899 Other long term (current) drug therapy: Secondary | ICD-10-CM | POA: Insufficient documentation

## 2017-09-11 DIAGNOSIS — R530 Neoplastic (malignant) related fatigue: Secondary | ICD-10-CM | POA: Diagnosis not present

## 2017-09-11 LAB — PSA: Prostatic Specific Antigen: 18.25 ng/mL — ABNORMAL HIGH (ref 0.00–4.00)

## 2017-09-11 LAB — CBC WITH DIFFERENTIAL/PLATELET
BASOS ABS: 0 10*3/uL (ref 0–0.1)
BASOS PCT: 0 %
Eosinophils Absolute: 0.3 10*3/uL (ref 0–0.7)
Eosinophils Relative: 5 %
HEMATOCRIT: 29.1 % — AB (ref 40.0–52.0)
HEMOGLOBIN: 9.3 g/dL — AB (ref 13.0–18.0)
Lymphocytes Relative: 16 %
Lymphs Abs: 0.9 10*3/uL — ABNORMAL LOW (ref 1.0–3.6)
MCH: 32 pg (ref 26.0–34.0)
MCHC: 32 g/dL (ref 32.0–36.0)
MCV: 99.9 fL (ref 80.0–100.0)
MONO ABS: 0.2 10*3/uL (ref 0.2–1.0)
Monocytes Relative: 4 %
NEUTROS ABS: 4.1 10*3/uL (ref 1.4–6.5)
NEUTROS PCT: 75 %
Platelets: 223 10*3/uL (ref 150–440)
RBC: 2.91 MIL/uL — ABNORMAL LOW (ref 4.40–5.90)
RDW: 20.5 % — AB (ref 11.5–14.5)
WBC: 5.6 10*3/uL (ref 3.8–10.6)

## 2017-09-11 LAB — COMPREHENSIVE METABOLIC PANEL
ALK PHOS: 65 U/L (ref 38–126)
ALT: 11 U/L (ref 0–44)
AST: 19 U/L (ref 15–41)
Albumin: 3.5 g/dL (ref 3.5–5.0)
Anion gap: 8 (ref 5–15)
BILIRUBIN TOTAL: 0.7 mg/dL (ref 0.3–1.2)
BUN: 13 mg/dL (ref 8–23)
CALCIUM: 8.5 mg/dL — AB (ref 8.9–10.3)
CO2: 20 mmol/L — ABNORMAL LOW (ref 22–32)
Chloride: 109 mmol/L (ref 98–111)
Creatinine, Ser: 1.61 mg/dL — ABNORMAL HIGH (ref 0.61–1.24)
GFR, EST AFRICAN AMERICAN: 45 mL/min — AB (ref >60)
GFR, EST NON AFRICAN AMERICAN: 38 mL/min — AB (ref >60)
Glucose, Bld: 110 mg/dL — ABNORMAL HIGH (ref 70–99)
Potassium: 4.2 mmol/L (ref 3.5–5.1)
Sodium: 137 mmol/L (ref 135–145)
TOTAL PROTEIN: 6.6 g/dL (ref 6.5–8.1)

## 2017-09-11 NOTE — Progress Notes (Signed)
Here for follow up.overall stated he feels" good but tired all the time "

## 2017-09-12 ENCOUNTER — Encounter: Payer: Self-pay | Admitting: Oncology

## 2017-09-15 ENCOUNTER — Ambulatory Visit (INDEPENDENT_AMBULATORY_CARE_PROVIDER_SITE_OTHER): Payer: Medicare Other

## 2017-09-15 ENCOUNTER — Other Ambulatory Visit: Payer: Self-pay

## 2017-09-15 VITALS — BP 98/59 | HR 120 | Ht 68.0 in | Wt 181.0 lb

## 2017-09-15 DIAGNOSIS — R300 Dysuria: Secondary | ICD-10-CM | POA: Diagnosis not present

## 2017-09-15 DIAGNOSIS — R338 Other retention of urine: Secondary | ICD-10-CM

## 2017-09-15 LAB — BLADDER SCAN AMB NON-IMAGING: Scan Result: 44

## 2017-09-15 NOTE — Progress Notes (Signed)
Pt is present today for a nurse visit. Pt is complaining of dysuria and pressure. Pt was recently seen for UTI, which had a negative culture result. Pt provided urine sample for analysis and culture. PVR was performed, noting 66ml of urine. Analysis showed no significance. Will send out for culture. Pt to follow-up with provider on 09/22/17.

## 2017-09-16 ENCOUNTER — Ambulatory Visit: Payer: Medicare Other

## 2017-09-16 LAB — URINALYSIS, COMPLETE
Bilirubin, UA: NEGATIVE
Glucose, UA: NEGATIVE
Ketones, UA: NEGATIVE
Leukocytes, UA: NEGATIVE
NITRITE UA: NEGATIVE
RBC UA: NEGATIVE
Specific Gravity, UA: 1.015 (ref 1.005–1.030)
UUROB: 0.2 mg/dL (ref 0.2–1.0)
pH, UA: 5.5 (ref 5.0–7.5)

## 2017-09-16 LAB — MICROSCOPIC EXAMINATION
Epithelial Cells (non renal): NONE SEEN /hpf (ref 0–10)
RBC, UA: NONE SEEN /hpf (ref 0–2)

## 2017-09-19 ENCOUNTER — Telehealth: Payer: Self-pay

## 2017-09-19 LAB — CULTURE, URINE COMPREHENSIVE

## 2017-09-19 MED ORDER — NITROFURANTOIN MONOHYD MACRO 100 MG PO CAPS: 100.0000 mg | ORAL_CAPSULE | Freq: Two times a day (BID) | ORAL | 0 refills | Status: DC

## 2017-09-19 NOTE — Telephone Encounter (Signed)
-----   Message from Nori Riis, PA-C sent at 09/19/2017 10:14 AM EDT ----- Please let Mr. Hamm know that his urine culture is positive for infection.  He needs to start nitrofurantoin 100 mg, bid x 7 days.

## 2017-09-22 ENCOUNTER — Encounter: Payer: Self-pay | Admitting: Urology

## 2017-09-22 ENCOUNTER — Ambulatory Visit (INDEPENDENT_AMBULATORY_CARE_PROVIDER_SITE_OTHER): Payer: Medicare Other | Admitting: Urology

## 2017-09-22 VITALS — BP 104/66 | HR 51 | Ht 68.0 in | Wt 180.3 lb

## 2017-09-22 DIAGNOSIS — C61 Malignant neoplasm of prostate: Secondary | ICD-10-CM

## 2017-09-22 DIAGNOSIS — R338 Other retention of urine: Secondary | ICD-10-CM | POA: Diagnosis not present

## 2017-09-22 DIAGNOSIS — N2889 Other specified disorders of kidney and ureter: Secondary | ICD-10-CM

## 2017-09-22 DIAGNOSIS — N1330 Unspecified hydronephrosis: Secondary | ICD-10-CM

## 2017-09-22 DIAGNOSIS — N3289 Other specified disorders of bladder: Secondary | ICD-10-CM

## 2017-09-22 MED ORDER — POLYETHYLENE GLYCOL 3350 17 GM/SCOOP PO POWD: 1.0000 | Freq: Once | ORAL | 0 refills | Status: AC

## 2017-09-22 NOTE — Progress Notes (Signed)
09/22/2017 11:17 AM   Terry Wood. [birth year] [id number]  Referring provider: No referring provider defined for this encounter.  Chief Complaint  Patient presents with  . Urinary Retention    HPI: Patient is an 84 year old Caucasian male with urinary retention, hematuria, metastatic prostate cancer, right renal mass and a left sided bladder mass with hydronephrosis who presents today for follow up after a TOV.  Background history He was hospitalized for AKI on CKD, hematuria, urinary retention and a small bowel obstruction and discharged on 08/14/2017.  Hematuria resolved with CBI and irrigation.  He did fail a TOV while in the hospital.  He is currently on tamsulosin.    Contrast CT in 03/2017 noted 3.1 x 3.6 x 3.8 cm exophytic right lower pole renal mass (series 4/image 27), compatible with solid renal neoplasm such as renal cell carcinoma.  2.9 x 4.1 cm left posterolateral bladder mass (series 2/image 90), grossly unchanged. Thick-walled bladder anteriorly.  Enhancing lesion along the left anterolateral prostate (series 2/image 93), likely corresponding to the patient's known prostate cancer. This is directly contiguous with the patient's left posterolateral bladder mass.  Non contrast CT on 08/09/2017 noted interval increase in RIGHT-sided hydronephrosis, moderate left hydronephrosis likely secondary to known mass within the urinary bladder.  Heterogeneous contents of the urinary bladder, consistent with known hematuria and mass in the region of the LEFT ureterovesical junction. This mass is not as well evaluated today on this noncontrast exam.  Small bowel obstruction to level of the RIGHT LOWER QUADRANT, possibly related to adhesions. No definite mass identified as a point of obstruction. solid mass in the LOWER pole the RIGHT kidney, suspicious for renal cell carcinoma.  New osseous metastatic disease.  Coronary artery disease.  Aortic atherosclerosis.   He had recently  completed palliative radiation and is receiving cabazitaxel.  On 09/11/2017, he was seen at the Cancer center.  He has elected to stop all intervention at this time.    Creatinine is 1.64.  PSA is 18.25.    He was recently diagnosed with an UTI and has completed a course of Macrobid.    BPH WITH LUTS  (prostate and/or bladder) IPSS score: 11/5   PVR: 44 mL   Major complaint(s):  Dysuria and nocturia for the last week.  He has a positive urine culture for enterococcus faecalis and he is currently on Macrobid.  He started the antibiotic on Friday the 13th.  He is now having constipation.     Denies any recent fevers, chills, nausea or vomiting.   IPSS    Row Name 09/22/17 1000         International Prostate Symptom Score   How often have you had the sensation of not emptying your bladder?  Not at All     How often have you had to urinate less than every two hours?  Less than 1 in 5 times     How often have you found you stopped and started again several times when you urinated?  Not at All     How often have you found it difficult to postpone urination?  Not at All     How often have you had a weak urinary stream?  More than half the time     How often have you had to strain to start urination?  More than half the time     How many times did you typically get up at night to urinate?  2  Times     Total IPSS Score  11       Quality of Life due to urinary symptoms   If you were to spend the rest of your life with your urinary condition just the way it is now how would you feel about that?  Unhappy        Score:  1-7 Mild 8-19 Moderate 20-35 Severe    PMH: Past Medical History:  Diagnosis Date  . Cancer of prostate ([place]) 07/13/2014  . Diabetes mellitus without complication ([place])   . Hypertension     Surgical History: No past surgical history on file.  Home Medications:  Allergies as of 09/22/2017   No Known Allergies     Medication List        Accurate as of 09/22/17  11:17 AM. Always use your most recent med list.          acidophilus Caps capsule Take 1 capsule by mouth daily.   allopurinol 100 MG tablet Commonly known as:  ZYLOPRIM Take 100 mg by mouth daily.   aspirin EC 81 MG tablet Take 81 mg by mouth daily.   calcium carbonate 500 MG chewable tablet Commonly known as:  TUMS - dosed in mg elemental calcium Chew 1 tablet (200 mg of elemental calcium total) by mouth 2 (two) times daily with a meal.   Fish Oil 1000 MG Caps Take 2,000 mg by mouth 2 (two) times daily.   HYDROcodone-acetaminophen 5-325 MG tablet Commonly known as:  NORCO/VICODIN Take 1 tablet by mouth every 6 (six) hours as needed for moderate pain.   nitrofurantoin (macrocrystal-monohydrate) 100 MG capsule Commonly known as:  MACROBID Take 1 capsule (100 mg total) by mouth every 12 (twelve) hours.   polyethylene glycol powder powder Commonly known as:  GLYCOLAX/MIRALAX Take 255 g by mouth once for 1 dose.   tamsulosin 0.4 MG Caps capsule Commonly known as:  FLOMAX Take 0.4 mg by mouth 2 (two) times daily.       Allergies: No Known Allergies  Family History: No family history on file.  Social History:  reports that he quit smoking about 6 weeks ago. His smoking use included cigarettes. He has never used smokeless tobacco. He reports that he drank alcohol. He reports that he does not use drugs.  ROS: UROLOGY Frequent Urination?: No Hard to postpone urination?: No Burning/pain with urination?: Yes Get up at night to urinate?: Yes Leakage of urine?: No Urine stream starts and stops?: No Trouble starting stream?: No Do you have to strain to urinate?: No Blood in urine?: No Urinary tract infection?: Yes Sexually transmitted disease?: No Injury to kidneys or bladder?: No Painful intercourse?: No Weak stream?: No Erection problems?: No Penile pain?: No  Gastrointestinal Nausea?: No Vomiting?: No Indigestion/heartburn?: No Diarrhea?:  No Constipation?: No  Constitutional Fever: No Night sweats?: No Weight loss?: No Fatigue?: No  Skin Skin rash/lesions?: No Itching?: No  Eyes Blurred vision?: No Double vision?: No  Ears/Nose/Throat Sore throat?: No Sinus problems?: No  Hematologic/Lymphatic Swollen glands?: No Easy bruising?: No  Cardiovascular Leg swelling?: No Chest pain?: No  Respiratory Cough?: No Shortness of breath?: No  Endocrine Excessive thirst?: No  Musculoskeletal Back pain?: No Joint pain?: No  Neurological Headaches?: No Dizziness?: No  Psychologic Depression?: No Anxiety?: No  Physical Exam: BP 104/66 (BP Location: Left Arm, Patient Position: Sitting, Cuff Size: Normal)   Pulse (!) 51   Ht 5\' 8"  (1.727 m)   Wt 180 lb 4.8 oz (  81.8 kg)   BMI 27.41 kg/m   Constitutional: Well nourished. Alert and oriented, No acute distress. HEENT: [place] AT, moist mucus membranes. Trachea midline, no masses. Cardiovascular: No clubbing, cyanosis, or edema. Respiratory: Normal respiratory effort, no increased work of breathing. Skin: No rashes, bruises or suspicious lesions. Lymph: No cervical or inguinal adenopathy. Neurologic: Grossly intact, no focal deficits, moving all 4 extremities. Psychiatric: Normal mood and affect.  Laboratory Data: Lab Results  Component Value Date   WBC 5.6 09/11/2017   HGB 9.3 (L) 09/11/2017   HCT 29.1 (L) 09/11/2017   MCV 99.9 09/11/2017   PLT 223 09/11/2017    Lab Results  Component Value Date   CREATININE 1.61 (H) 09/11/2017    Lab Results  Component Value Date   PSA 0.95 05/21/2016   PSA 0.28 02/22/2016   PSA 0.19 11/21/2015    No results found for: TESTOSTERONE  Lab Results  Component Value Date   HGBA1C 6.7 (H) 08/03/2017    Lab Results  Component Value Date   TSH 1.260 08/03/2017    No results found for: CHOL, HDL, CHOLHDL, VLDL, LDLCALC  Lab Results  Component Value Date   AST 19 09/11/2017   Lab Results  Component  Value Date   ALT 11 09/11/2017   No components found for: ALKALINEPHOPHATASE No components found for: BILIRUBINTOTAL  No results found for: ESTRADIOL  I have reviewed the labs.  Pertinent Imaging Results for ELCHANAN, BOB (MRN [id number]) as of 09/22/2017 11:05  Ref. Range 09/15/2017 11:30  Scan Result Unknown 44    Assessment & Plan:    1. Acute urinary retention:    Resolved  2. Gross hematuria No reports of gross hematuria Will continue to monitor - will report any gross hematuria or clots  3. Metastatic prostate cancer Managed through the Cancer  - has decided to discontinue treatments at this time  4. Left hydronephrosis Asymptomatic left hydronephrosis since at least 3/22019 which does not appear to have been address Still present on RUS in 07/2017 Patient did not want any intervention unless he started to experience pain and/or a decrease in renal function   5. Right renal mass 3.8 cm right renal mass, slow interval growth Recommend continued observation Repeat imaging in 6 months  (01/2018)  6. Bladder mass Most likely prostate cancer infiltrating the bladder May consider cystoscopy in the future if hematuria persists   7. UTI Currently on Macrobid - will RTC on Monday if no improvement   Return for appointment on Monday; if feeling better he will not come.  These notes generated with voice recognition software. I apologize for typographical errors.  Zara Council, PA-C  [facility] [address]  [place],  [address] [phone]

## 2017-09-26 ENCOUNTER — Other Ambulatory Visit: Payer: Self-pay

## 2017-09-26 ENCOUNTER — Observation Stay
Admission: EM | Admit: 2017-09-26 | Discharge: 2017-09-27 | Disposition: A | Discharge status: Home / Self Care | Payer: Medicare Other | Attending: Internal Medicine | Admitting: Internal Medicine

## 2017-09-26 ENCOUNTER — Ambulatory Visit (INDEPENDENT_AMBULATORY_CARE_PROVIDER_SITE_OTHER): Payer: Medicare Other

## 2017-09-26 ENCOUNTER — Telehealth: Payer: Self-pay

## 2017-09-26 ENCOUNTER — Ambulatory Visit: Payer: Medicare Other

## 2017-09-26 ENCOUNTER — Encounter: Payer: Self-pay | Admitting: Emergency Medicine

## 2017-09-26 VITALS — BP 163/80 | HR 109

## 2017-09-26 DIAGNOSIS — R319 Hematuria, unspecified: Secondary | ICD-10-CM | POA: Diagnosis present

## 2017-09-26 DIAGNOSIS — R338 Other retention of urine: Secondary | ICD-10-CM

## 2017-09-26 DIAGNOSIS — I10 Essential (primary) hypertension: Secondary | ICD-10-CM | POA: Insufficient documentation

## 2017-09-26 DIAGNOSIS — Z7982 Long term (current) use of aspirin: Secondary | ICD-10-CM | POA: Diagnosis not present

## 2017-09-26 DIAGNOSIS — N1330 Unspecified hydronephrosis: Secondary | ICD-10-CM | POA: Insufficient documentation

## 2017-09-26 DIAGNOSIS — Z66 Do not resuscitate: Secondary | ICD-10-CM | POA: Insufficient documentation

## 2017-09-26 DIAGNOSIS — Z79899 Other long term (current) drug therapy: Secondary | ICD-10-CM | POA: Insufficient documentation

## 2017-09-26 DIAGNOSIS — Z87891 Personal history of nicotine dependence: Secondary | ICD-10-CM | POA: Insufficient documentation

## 2017-09-26 DIAGNOSIS — Z8546 Personal history of malignant neoplasm of prostate: Secondary | ICD-10-CM | POA: Diagnosis not present

## 2017-09-26 DIAGNOSIS — K529 Noninfective gastroenteritis and colitis, unspecified: Secondary | ICD-10-CM | POA: Insufficient documentation

## 2017-09-26 DIAGNOSIS — N2889 Other specified disorders of kidney and ureter: Secondary | ICD-10-CM | POA: Diagnosis not present

## 2017-09-26 DIAGNOSIS — R300 Dysuria: Secondary | ICD-10-CM

## 2017-09-26 DIAGNOSIS — E119 Type 2 diabetes mellitus without complications: Secondary | ICD-10-CM | POA: Diagnosis not present

## 2017-09-26 LAB — URINALYSIS, COMPLETE (UACMP) WITH MICROSCOPIC: SPECIFIC GRAVITY, URINE: 1.016 (ref 1.005–1.030)

## 2017-09-26 LAB — COMPREHENSIVE METABOLIC PANEL
ALT: 8 U/L (ref 0–44)
ANION GAP: 7 (ref 5–15)
AST: 16 U/L (ref 15–41)
Albumin: 3.6 g/dL (ref 3.5–5.0)
Alkaline Phosphatase: 60 U/L (ref 38–126)
BUN: 17 mg/dL (ref 8–23)
CO2: 24 mmol/L (ref 22–32)
Calcium: 8.6 mg/dL — ABNORMAL LOW (ref 8.9–10.3)
Chloride: 109 mmol/L (ref 98–111)
Creatinine, Ser: 1.34 mg/dL — ABNORMAL HIGH (ref 0.61–1.24)
GFR, EST AFRICAN AMERICAN: 56 mL/min — AB (ref >60)
GFR, EST NON AFRICAN AMERICAN: 48 mL/min — AB (ref >60)
GLUCOSE: 142 mg/dL — AB (ref 70–99)
POTASSIUM: 4.8 mmol/L (ref 3.5–5.1)
SODIUM: 140 mmol/L (ref 135–145)
TOTAL PROTEIN: 6.6 g/dL (ref 6.5–8.1)
Total Bilirubin: 0.5 mg/dL (ref 0.3–1.2)

## 2017-09-26 LAB — CBC WITH DIFFERENTIAL/PLATELET
Basophils Absolute: 0 10*3/uL (ref 0–0.1)
Basophils Relative: 0 %
EOS ABS: 0 10*3/uL (ref 0–0.7)
EOS PCT: 0 %
HCT: 29.2 % — ABNORMAL LOW (ref 40.0–52.0)
Hemoglobin: 9.9 g/dL — ABNORMAL LOW (ref 13.0–18.0)
LYMPHS ABS: 0.9 10*3/uL — AB (ref 1.0–3.6)
LYMPHS PCT: 10 %
MCH: 33.1 pg (ref 26.0–34.0)
MCHC: 34 g/dL (ref 32.0–36.0)
MCV: 97.2 fL (ref 80.0–100.0)
MONO ABS: 0.4 10*3/uL (ref 0.2–1.0)
Monocytes Relative: 4 %
Neutro Abs: 7.8 10*3/uL — ABNORMAL HIGH (ref 1.4–6.5)
Neutrophils Relative %: 86 %
PLATELETS: 268 10*3/uL (ref 150–440)
RBC: 3 MIL/uL — ABNORMAL LOW (ref 4.40–5.90)
RDW: 18.3 % — ABNORMAL HIGH (ref 11.5–14.5)
WBC: 9.1 10*3/uL (ref 3.8–10.6)

## 2017-09-26 LAB — BLADDER SCAN AMB NON-IMAGING: SCAN RESULT: 690

## 2017-09-26 LAB — MICROSCOPIC EXAMINATION
RBC, UA: >30 /hpf — ABNORMAL HIGH (ref 0–2)
WBC UA: NONE SEEN /HPF (ref 0–5)

## 2017-09-26 LAB — URINALYSIS, COMPLETE
Bilirubin, UA: NEGATIVE
GLUCOSE, UA: NEGATIVE
Ketones, UA: NEGATIVE
LEUKOCYTES UA: NEGATIVE
Nitrite, UA: NEGATIVE
PH UA: 6.5 (ref 5.0–7.5)
Specific Gravity, UA: 1.01 (ref 1.005–1.030)
UUROB: 0.2 mg/dL (ref 0.2–1.0)

## 2017-09-26 MED ORDER — SODIUM CHLORIDE 0.9 % IV BOLUS
1000.0000 mL | Freq: Once | INTRAVENOUS | Status: AC
  Administered 2017-09-26: 1000 mL via INTRAVENOUS

## 2017-09-26 NOTE — ED Notes (Signed)
Blood draw attempted x2 by Marguarite Arbour RN.

## 2017-09-26 NOTE — ED Notes (Signed)
C/o feeling weak and dizzy.  Skin warm and dry  nad  Vs reassessed and pt placed in sub waiting area with a recliner and family.  Cont to monitor,

## 2017-09-26 NOTE — ED Provider Notes (Signed)
[facility] Emergency Department Provider Note   ____________________________________________   First MD Initiated Contact with Patient 09/26/17 2041     (approximate)  I have reviewed the triage vital signs and the nursing notes.   HISTORY  Chief Complaint Hematuria   HPI Terry Wood. is a 84 y.o. male with hematuria.  He has known prostate cancer which is no longer being treated and a bladder tumor as well.  He has had to have continuous bladder irrigation in the past.  He had some hematuria today went to the urologist and had his catheter irrigated went home had more hematuria came back had a catheter change and irrigated more as I understand had a large clot pulled out went back home and is now bleeding again.  Last time he was in the hospital which was not that long ago he was in the hospital for 7 days on continuous bladder irrigation.  Currently was coming out of his Foley catheter is mostly blood.   Past Medical History:  Diagnosis Date  . Cancer of prostate ([place]) 07/13/2014  . Diabetes mellitus without complication ([place])   . Hypertension     Patient Active Problem List   Diagnosis Date Noted  . Urinary retention   . Hydronephrosis, left   . SBO (small bowel obstruction) ([place])   . Malnutrition of moderate degree 08/05/2017  . Acute kidney injury superimposed on chronic kidney disease ([place]) 08/04/2017  . Goals of care, counseling/discussion 12/03/2016  . Primary prostate cancer with metastasis from prostate to other site [facility]) 07/13/2014    History reviewed. No pertinent surgical history.  Prior to Admission medications   Medication Sig Start Date End Date Taking? Authorizing Provider  acidophilus (RISAQUAD) CAPS capsule Take 1 capsule by mouth daily. 08/15/17   Vaughan Basta, MD  allopurinol (ZYLOPRIM) 100 MG tablet Take 100 mg by mouth daily.  06/16/17   [provider]  aspirin EC 81 MG tablet Take 81 mg by mouth  daily.    [provider]  calcium carbonate (TUMS - DOSED IN MG ELEMENTAL CALCIUM) 500 MG chewable tablet Chew 1 tablet (200 mg of elemental calcium total) by mouth 2 (two) times daily with a meal. 08/13/17   Demetrios Loll, MD  HYDROcodone-acetaminophen (NORCO/VICODIN) 5-325 MG tablet Take 1 tablet by mouth every 6 (six) hours as needed for moderate pain. 06/19/17   Lloyd Huger, MD  nitrofurantoin, macrocrystal-monohydrate, (MACROBID) 100 MG capsule Take 1 capsule (100 mg total) by mouth every 12 (twelve) hours. 09/19/17   Zara Council A, PA-C  Omega-3 Fatty Acids (FISH OIL) 1000 MG CAPS Take 2,000 mg by mouth 2 (two) times daily.     [provider]  tamsulosin (FLOMAX) 0.4 MG CAPS capsule Take 0.4 mg by mouth 2 (two) times daily.    [provider]  prochlorperazine (COMPAZINE) 10 MG tablet Take 1 tablet (10 mg total) every 6 (six) hours as needed by mouth (Nausea or vomiting). 11/15/16 06/04/17  Lloyd Huger, MD    Allergies Patient has no known allergies.  No family history on file.  Social History Social History   Tobacco Use  . Smoking status: Former Smoker    Types: Cigarettes    Last attempt to quit: 08/07/2017    Years since quitting: 0.1  . Smokeless tobacco: Never Used  Substance Use Topics  . Alcohol use: Not Currently    Alcohol/week: 0.0 standard drinks  . Drug use: No  Review of Systems  Constitutional: No fever/chills Eyes: No visual changes. ENT: No sore throat. Cardiovascular: Denies chest pain. Respiratory: Denies shortness of breath. Gastrointestinal: No abdominal pain.  No nausea, no vomiting.  No diarrhea.  No constipation. Genitourinary: Some, most likely catheter related, dysuria. Musculoskeletal: Negative for back pain. Skin: Negative for rash. Neurological: Negative for headaches, focal weakness   ____________________________________________   PHYSICAL EXAM:  VITAL SIGNS: ED Triage Vitals  Enc Vitals  Group     BP 09/26/17 1657 111/65     Pulse Rate 09/26/17 1657 (!) 110     Resp 09/26/17 1657 18     Temp 09/26/17 1657 97.6 F (36.4 C)     Temp Source 09/26/17 1657 Oral     SpO2 09/26/17 1657 97 %     Weight 09/26/17 1657 180 lb 8 oz (81.9 kg)     Height 09/26/17 1657 5\' 8"  (1.727 m)     Head Circumference --      [place] Flow --      Pain Score 09/26/17 1701 8     Pain Loc --      Pain Edu? --      Excl. in [place]? --     Constitutional: Alert and oriented.  Nontoxic-appearing Eyes: Conjunctivae are normal. Head: Atraumatic. Nose: No congestion/rhinnorhea. Mouth/Throat: Mucous membranes are moist.  Oropharynx non-erythematous. Neck: No stridor.  Cardiovascular: Normal rate, regular rhythm. Grossly normal heart sounds.  Good peripheral circulation. Respiratory: Normal respiratory effort.  No retractions. Lungs CTAB. Gastrointestinal: Soft and nontender. No distention. No abdominal bruits. No CVA tenderness. Musculoskeletal: No lower extremity tenderness nor edema.   Neurologic:  Normal speech and language. No gross focal neurologic deficits are appreciated. Skin:  Skin is warm, dry and intact. No rash noted. Psychiatric: Mood and affect are normal. Speech and behavior are normal.  ____________________________________________   LABS (all labs ordered are listed, but only abnormal results are displayed)  Labs Reviewed  URINALYSIS, COMPLETE (UACMP) WITH MICROSCOPIC - Abnormal; Notable for the following components:      Result Value   Color, Urine RED (*)    APPearance BLOODY (*)    Glucose, UA   (*)    Value: TEST NOT REPORTED DUE TO COLOR INTERFERENCE OF URINE PIGMENT   Hgb urine dipstick   (*)    Value: TEST NOT REPORTED DUE TO COLOR INTERFERENCE OF URINE PIGMENT   Bilirubin Urine   (*)    Value: TEST NOT REPORTED DUE TO COLOR INTERFERENCE OF URINE PIGMENT   Ketones, ur   (*)    Value: TEST NOT REPORTED DUE TO COLOR INTERFERENCE OF URINE PIGMENT   Protein, ur   (*)     Value: TEST NOT REPORTED DUE TO COLOR INTERFERENCE OF URINE PIGMENT   Nitrite   (*)    Value: TEST NOT REPORTED DUE TO COLOR INTERFERENCE OF URINE PIGMENT   Leukocytes, UA   (*)    Value: TEST NOT REPORTED DUE TO COLOR INTERFERENCE OF URINE PIGMENT   All other components within normal limits  CBC WITH DIFFERENTIAL/PLATELET - Abnormal; Notable for the following components:   RBC 3.00 (*)    Hemoglobin 9.9 (*)    HCT 29.2 (*)    RDW 18.3 (*)    Neutro Abs 7.8 (*)    Lymphs Abs 0.9 (*)    All other components within normal limits  COMPREHENSIVE METABOLIC PANEL - Abnormal; Notable for the following components:   Glucose, Bld 142 (*)  Creatinine, Ser 1.34 (*)    Calcium 8.6 (*)    GFR calc non Af Amer 48 (*)    GFR calc Af Amer 56 (*)    All other components within normal limits  URINE CULTURE   ____________________________________________  EKG   ____________________________________________  RADIOLOGY  ED MD interpretation:    Official radiology report(s): No results found.  ____________________________________________   PROCEDURES  Procedure(s) performed: Procedures  Critical Care performed:   ____________________________________________   INITIAL IMPRESSION / ASSESSMENT AND PLAN / ED COURSE  Patient is reluctant to go home as this is the second or third time his catheter was clotted tonight.  Discussed him with Dr. Junious Silk she is more than happy to have him in the hospital get the hospitalist to admit him monitor him and irrigate as needed.  Will not do continuous bladder irrigation at present.         ____________________________________________   FINAL CLINICAL IMPRESSION(S) / ED DIAGNOSES  Final diagnoses:  Hematuria, unspecified type     ED Discharge Orders    None       Note:  This document was prepared using Dragon voice recognition software and may include unintentional dictation errors.    Nena Polio, MD 09/27/17 0001

## 2017-09-26 NOTE — Progress Notes (Signed)
Pt returned to the clinic this afternoon, experiencing leakage of blood from his penis and catheter. Upon examination, it appears pt was in clot retention. Catheter was irrigated, removing multiple clot pieces, however urine was not draining from the catheter. Per Dr Bernardo Heater, catheter was removed and new one placed. No urine return was noted. Pt was taken to ED for continuous irrigation.

## 2017-09-26 NOTE — Progress Notes (Signed)
Simple Catheter Placement  Due to urinary retention patient is present today for a foley cath placement.  Patient was cleaned and prepped in a sterile fashion with betadine and lidocaine jelly 2% was instilled into the urethra.  A 18 Coude latex free foley catheter was inserted, urine return was noted  572m, urine was brown in color.  The balloon was filled with 10cc of sterile water.  A leg bag was attached for drainage. Patient was also given a night bag to take home and was given instruction on how to change from one bag to another.  Patient was given instruction on proper catheter care. Complications were noted as: While trying to insert catheter, restriction was met while trying to enter the urethra. Blood was noted. Dr. BErlene Quanwas asked to assist. Dr. BErlene Quanwas able to successfully insert catheter, noting a fossa navicularis stricture.   Preformed by: Dr. AHollice Espyand CCristie Hem CMA  Additional notes/ Follow up: Monday with SZara Council PA.

## 2017-09-26 NOTE — Telephone Encounter (Signed)
Pt dtr lori left vm that pt was experiencing bleeding after cath placement. She states blood is going everywhere. I attempted to reach [place] by phone, no answer, left vm to bring pt to office or ED. Advised her we would be closing early.

## 2017-09-26 NOTE — ED Triage Notes (Signed)
PT arrived with concerns over blood in urinary catheter bag. Pt states catheter was placed this morning. PT reports "this keeps happening."

## 2017-09-26 NOTE — ED Notes (Signed)
Foley cath irrigated with approximately 60 mls of sterile water. Return of clear red colored liquid. Clot flushed from the end of the collection bag where it was connected to the foley. Pt tolerated well.

## 2017-09-26 NOTE — ED Triage Notes (Signed)
First Nurse Note:   Arrives from Urology Clinic for evaluation of foley catheter.  Catheter placed this morning at around 1030, initially drained well, but now not draining.  Catheter irrigated in Urology Clinic, but has not resolved.  States the last time this happened he required CBI.  Patient AAOx3.  Skin warm and dry. NAD

## 2017-09-27 ENCOUNTER — Other Ambulatory Visit: Payer: Self-pay

## 2017-09-27 DIAGNOSIS — C61 Malignant neoplasm of prostate: Secondary | ICD-10-CM | POA: Diagnosis not present

## 2017-09-27 DIAGNOSIS — R338 Other retention of urine: Secondary | ICD-10-CM

## 2017-09-27 DIAGNOSIS — R310 Gross hematuria: Secondary | ICD-10-CM

## 2017-09-27 DIAGNOSIS — R319 Hematuria, unspecified: Secondary | ICD-10-CM | POA: Diagnosis present

## 2017-09-27 DIAGNOSIS — N2889 Other specified disorders of kidney and ureter: Secondary | ICD-10-CM | POA: Diagnosis not present

## 2017-09-27 MED ORDER — TAMSULOSIN HCL 0.4 MG PO CAPS
0.4000 mg | ORAL_CAPSULE | Freq: Two times a day (BID) | ORAL | Status: DC
  Administered 2017-09-27: 09:00:00 0.4 mg via ORAL
  Filled 2017-09-27: qty 1

## 2017-09-27 MED ORDER — ALLOPURINOL 100 MG PO TABS
100.0000 mg | ORAL_TABLET | Freq: Every day | ORAL | Status: DC
  Administered 2017-09-27: 100 mg via ORAL
  Filled 2017-09-27: qty 1

## 2017-09-27 MED ORDER — RISAQUAD PO CAPS
1.0000 | ORAL_CAPSULE | Freq: Every day | ORAL | Status: DC
  Administered 2017-09-27: 09:00:00 1 via ORAL
  Filled 2017-09-27: qty 1

## 2017-09-27 MED ORDER — ONDANSETRON HCL 4 MG/2ML IJ SOLN: 4.0000 mg | Freq: Four times a day (QID) | INTRAMUSCULAR | Status: DC | PRN

## 2017-09-27 MED ORDER — ACETAMINOPHEN 650 MG RE SUPP: 650.0000 mg | Freq: Four times a day (QID) | RECTAL | Status: DC | PRN

## 2017-09-27 MED ORDER — HYDROCODONE-ACETAMINOPHEN 5-325 MG PO TABS: 1.0000 | ORAL_TABLET | Freq: Four times a day (QID) | ORAL | Status: DC | PRN

## 2017-09-27 MED ORDER — DOCUSATE SODIUM 100 MG PO CAPS
100.0000 mg | ORAL_CAPSULE | Freq: Two times a day (BID) | ORAL | Status: DC
  Administered 2017-09-27: 100 mg via ORAL
  Filled 2017-09-27: qty 1

## 2017-09-27 MED ORDER — ASPIRIN EC 81 MG PO TBEC
81.0000 mg | DELAYED_RELEASE_TABLET | Freq: Every day | ORAL | Status: DC
  Filled 2017-09-27: qty 1

## 2017-09-27 MED ORDER — ACETAMINOPHEN 325 MG PO TABS: 650.0000 mg | ORAL_TABLET | Freq: Four times a day (QID) | ORAL | Status: DC | PRN

## 2017-09-27 MED ORDER — ONDANSETRON HCL 4 MG PO TABS: 4.0000 mg | ORAL_TABLET | Freq: Four times a day (QID) | ORAL | Status: DC | PRN

## 2017-09-27 MED ORDER — OMEGA-3-ACID ETHYL ESTERS 1 G PO CAPS
2.0000 g | ORAL_CAPSULE | Freq: Two times a day (BID) | ORAL | Status: DC
  Administered 2017-09-27: 2 g via ORAL
  Filled 2017-09-27: qty 2

## 2017-09-27 NOTE — H&P (Signed)
Terry Wood. is an 84 y.o. male.   Chief Complaint: Hematuria HPI: The patient with past medical history of prostate cancer and bladder mass presents emergency department with hematuria.  The patient was seen at his urology office just yesterday with the same complaints and he was admitted to the hospital recently for the same.  He had been passing urine until this evening at which time he required catheterization here in the emergency department.  This evacuated bright red blood and clots.  The previous hospital admission was significant for continuous bladder irrigation but urology advised against this with this encounter.  Once the patient was stabilized the emergency department staff called the hospitalist service for admission.  Past Medical History:  Diagnosis Date  . Cancer of prostate ([place]) 07/13/2014  . Diabetes mellitus without complication ([place])   . Hypertension     History reviewed. No pertinent surgical history.  No family history on file. Social History:  reports that he quit smoking about 7 weeks ago. His smoking use included cigarettes. He has never used smokeless tobacco. He reports that he drank alcohol. He reports that he does not use drugs.  Allergies: No Known Allergies   (Not in a hospital admission)  Results for orders placed or performed during the hospital encounter of 09/26/17 (from the past 48 hour(s))  Urinalysis, Complete w Microscopic     Status: Abnormal   Collection Time: 09/26/17  5:02 PM  Result Value Ref Range   Color, Urine RED (A) YELLOW   APPearance BLOODY (A) CLEAR   Specific Gravity, Urine 1.016 1.005 - 1.030   pH  5.0 - 8.0    TEST NOT REPORTED DUE TO COLOR INTERFERENCE OF URINE PIGMENT   Glucose, UA (A) NEGATIVE mg/dL    TEST NOT REPORTED DUE TO COLOR INTERFERENCE OF URINE PIGMENT   Hgb urine dipstick (A) NEGATIVE    TEST NOT REPORTED DUE TO COLOR INTERFERENCE OF URINE PIGMENT   Bilirubin Urine (A) NEGATIVE    TEST NOT REPORTED DUE TO  COLOR INTERFERENCE OF URINE PIGMENT   Ketones, ur (A) NEGATIVE mg/dL    TEST NOT REPORTED DUE TO COLOR INTERFERENCE OF URINE PIGMENT   Protein, ur (A) NEGATIVE mg/dL    TEST NOT REPORTED DUE TO COLOR INTERFERENCE OF URINE PIGMENT   Nitrite (A) NEGATIVE    TEST NOT REPORTED DUE TO COLOR INTERFERENCE OF URINE PIGMENT   Leukocytes, UA (A) NEGATIVE    TEST NOT REPORTED DUE TO COLOR INTERFERENCE OF URINE PIGMENT    Comment: Performed at [facility], [address] [place], [place] [address]  CBC with Differential     Status: Abnormal   Collection Time: 09/26/17  9:32 PM  Result Value Ref Range   WBC 9.1 3.8 - 10.6 K/uL   RBC 3.00 (L) 4.40 - 5.90 MIL/uL   Hemoglobin 9.9 (L) 13.0 - 18.0 g/dL   HCT 29.2 (L) 40.0 - 52.0 %   MCV 97.2 80.0 - 100.0 fL   MCH 33.1 26.0 - 34.0 pg   MCHC 34.0 32.0 - 36.0 g/dL   RDW 18.3 (H) 11.5 - 14.5 %   Platelets 268 150 - 440 K/uL   Neutrophils Relative % 86 %   Neutro Abs 7.8 (H) 1.4 - 6.5 K/uL   Lymphocytes Relative 10 %   Lymphs Abs 0.9 (L) 1.0 - 3.6 K/uL   Monocytes Relative 4 %   Monocytes Absolute 0.4 0.2 - 1.0 K/uL   Eosinophils Relative 0 %  Eosinophils Absolute 0.0 0 - 0.7 K/uL   Basophils Relative 0 %   Basophils Absolute 0.0 0 - 0.1 K/uL    Comment: Performed at [facility], [address] [place], [place] [address]  Comprehensive metabolic panel     Status: Abnormal   Collection Time: 09/26/17  9:32 PM  Result Value Ref Range   Sodium 140 135 - 145 mmol/L   Potassium 4.8 3.5 - 5.1 mmol/L   Chloride 109 98 - 111 mmol/L   CO2 24 22 - 32 mmol/L   Glucose, Bld 142 (H) 70 - 99 mg/dL   BUN 17 8 - 23 mg/dL   Creatinine, Ser 1.34 (H) 0.61 - 1.24 mg/dL   Calcium 8.6 (L) 8.9 - 10.3 mg/dL   Total Protein 6.6 6.5 - 8.1 g/dL   Albumin 3.6 3.5 - 5.0 g/dL   AST 16 15 - 41 U/L   ALT 8 0 - 44 U/L   Alkaline Phosphatase 60 38 - 126 U/L   Total Bilirubin 0.5 0.3 - 1.2 mg/dL   GFR calc non Af Amer 48 (L) >60 mL/min   GFR calc  Af Amer 56 (L) >60 mL/min    Comment: (NOTE) The eGFR has been calculated using the CKD EPI equation. This calculation has not been validated in all clinical situations. eGFR's persistently <60 mL/min signify possible Chronic Kidney Disease.    Anion gap 7 5 - 15    Comment: Performed at [facility], [address] [place], [place] [address]   No results found.  Review of Systems  Constitutional: Negative for chills and fever.  HENT: Negative for sore throat and tinnitus.   Eyes: Negative for blurred vision and redness.  Respiratory: Negative for cough and shortness of breath.   Cardiovascular: Negative for chest pain, palpitations, orthopnea and PND.  Gastrointestinal: Negative for abdominal pain, diarrhea, nausea and vomiting.  Genitourinary: Positive for hematuria. Negative for dysuria, frequency and urgency.  Musculoskeletal: Negative for joint pain and myalgias.  Skin: Negative for rash.       No lesions  Neurological: Negative for speech change, focal weakness and weakness.  Endo/Heme/Allergies: Does not bruise/bleed easily.       No temperature intolerance  Psychiatric/Behavioral: Negative for depression and suicidal ideas.    Blood pressure (!) 106/54, pulse 73, temperature 97.6 F (36.4 C), temperature source Oral, resp. rate 18, height 5' 8" (1.727 m), weight 81.9 kg, SpO2 98 %. Physical Exam  Vitals reviewed. Constitutional: He is oriented to person, place, and time. He appears well-developed and well-nourished. No distress.  HENT:  Head: Normocephalic and atraumatic.  Mouth/Throat: Oropharynx is clear and moist.  Eyes: Pupils are equal, round, and reactive to light. Conjunctivae and EOM are normal. No scleral icterus.  Neck: Normal range of motion. Neck supple. No JVD present. No tracheal deviation present. No thyromegaly present.  Cardiovascular: Normal rate, regular rhythm and normal heart sounds. Exam reveals no gallop and no friction rub.  No murmur  heard. Respiratory: Effort normal and breath sounds normal. No respiratory distress.  GI: Soft. Bowel sounds are normal. He exhibits no distension. There is no tenderness.  Genitourinary:  Genitourinary Comments: Foley in place  Musculoskeletal: Normal range of motion. He exhibits no edema.  Lymphadenopathy:    He has no cervical adenopathy.  Neurological: He is alert and oriented to person, place, and time. No cranial nerve deficit.  Skin: Skin is warm and dry. No rash noted. No erythema.  Psychiatric: He has  a normal mood and affect. His behavior is normal. Judgment and thought content normal.     Assessment/Plan This is an 84 year old male admitted for hematuria. 1.  Hematuria: Secondary to bladder cancer.  Defer to urology for further guidance.  The patient needs end-of-life planning.  Continue tamsulosin 2.  Prostate cancer: Consult oncology at the discretion of the primary team and family 3.  DVT prophylaxis: SCDs 4.  GI prophylaxis: None The patient is a DNR.  Time spent on admission orders and patient care approximately 45 minutes  Harrie Foreman, MD 09/27/2017, 2:31 AM

## 2017-09-27 NOTE — Consult Note (Signed)
Consultation: Gross hematuria, urinary retention Requested by: Dr. 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  He has had issues with retention over the past 5 weeks and gross hematuria.  Catheter was replaced a week or 2 ago when he developed gross hematuria yesterday requiring multiple trips to emergency department for irrigation.  He was admitted for observation and the urine appears to be clearing.  Catheter has been draining fine overnight and currently.  He is with his family and his daughter who is a Marine scientist here at the hospital.  I reviewed multiple urology notes as well as oncology notes.  Also reviewed the patient's recent CT scan showing likely bladder involvement of the prostate cancer with a bilateral hydronephrosis.  Patient's current hemoglobin is 9.9 which is up for him and creatinine is stable at 1.34.  Past Medical History:  Diagnosis Date  . Cancer of prostate ([place]) 07/13/2014  . Diabetes mellitus without complication ([place])   . Hypertension    History reviewed. No pertinent surgical history.  Home Medications:  Medications Prior to Admission  Medication Sig Dispense Refill Last Dose  . acidophilus (RISAQUAD) CAPS capsule Take 1 capsule by mouth daily. 30 capsule 0 Past Week at Unknown time  . allopurinol (ZYLOPRIM) 100 MG tablet Take 100 mg by mouth daily.    Past Week at Unknown time  . aspirin EC 81 MG tablet Take 81 mg by mouth daily.   Past Week at Unknown time  . HYDROcodone-acetaminophen (NORCO/VICODIN) 5-325 MG tablet Take 1 tablet by mouth every 6 (six) hours as needed for moderate pain. 30 tablet 0 prn at prn  . Omega-3 Fatty Acids (FISH OIL) 1000 MG CAPS Take 2,000 mg by mouth 2 (two) times daily.    Past Week at Unknown time  . tamsulosin (FLOMAX) 0.4 MG CAPS capsule Take 0.4 mg by mouth 2 (two)  times daily.   Past Week at Unknown time   Allergies: No Known Allergies  No family history on file. Social History:  reports that he quit smoking about 7 weeks ago. His smoking use included cigarettes. He has never used smokeless tobacco. He reports that he drank alcohol. He reports that he does not use drugs.  ROS: A complete review of systems was performed.  All systems are negative except for pertinent findings as noted. Review of Systems  All other systems reviewed and are negative.    Physical Exam:  Vital signs in last 24 hours: Temp:  [97.6 F (36.4 C)-98.1 F (36.7 C)] 98 F (36.7 C) (09/21 1328) Pulse Rate:  [67-112] 67 (09/21 1328) Resp:  [18] 18 (09/21 1328) BP: (106-163)/(54-95) 119/69 (09/21 1328) SpO2:  [95 %-99 %] 98 % (09/21 1328) Weight:  [81.9 kg-82.6 kg] 82.6 kg (09/21 0358) General:  Alert and oriented, No acute distress HEENT: Normocephalic, atraumatic Cardiovascular: Regular rate and rhythm Lungs: Regular rate and effort Abdomen: Soft, nontender, nondistended, no abdominal masses Back: No CVA tenderness Extremities: No edema Neurologic: Grossly intact GU: Uncircumcised penis without mass or lesion-Foley catheter in place  Bladder irrigation-I irrigated the Foley catheter and it quickly cleared.  I irrigated a few old clots and irrigation returned clear.  No current bleeding.  Laboratory Data:  Results for orders placed or performed during the hospital encounter of 09/26/17 (from the past 24 hour(s))  Urinalysis, Complete w Microscopic  Status: Abnormal   Collection Time: 09/26/17  5:02 PM  Result Value Ref Range   Color, Urine RED (A) YELLOW   APPearance BLOODY (A) CLEAR   Specific Gravity, Urine 1.016 1.005 - 1.030   pH  5.0 - 8.0    TEST NOT REPORTED DUE TO COLOR INTERFERENCE OF URINE PIGMENT   Glucose, UA (A) NEGATIVE mg/dL    TEST NOT REPORTED DUE TO COLOR INTERFERENCE OF URINE PIGMENT   Hgb urine dipstick (A) NEGATIVE    TEST NOT  REPORTED DUE TO COLOR INTERFERENCE OF URINE PIGMENT   Bilirubin Urine (A) NEGATIVE    TEST NOT REPORTED DUE TO COLOR INTERFERENCE OF URINE PIGMENT   Ketones, ur (A) NEGATIVE mg/dL    TEST NOT REPORTED DUE TO COLOR INTERFERENCE OF URINE PIGMENT   Protein, ur (A) NEGATIVE mg/dL    TEST NOT REPORTED DUE TO COLOR INTERFERENCE OF URINE PIGMENT   Nitrite (A) NEGATIVE    TEST NOT REPORTED DUE TO COLOR INTERFERENCE OF URINE PIGMENT   Leukocytes, UA (A) NEGATIVE    TEST NOT REPORTED DUE TO COLOR INTERFERENCE OF URINE PIGMENT  CBC with Differential     Status: Abnormal   Collection Time: 09/26/17  9:32 PM  Result Value Ref Range   WBC 9.1 3.8 - 10.6 K/uL   RBC 3.00 (L) 4.40 - 5.90 MIL/uL   Hemoglobin 9.9 (L) 13.0 - 18.0 g/dL   HCT 29.2 (L) 40.0 - 52.0 %   MCV 97.2 80.0 - 100.0 fL   MCH 33.1 26.0 - 34.0 pg   MCHC 34.0 32.0 - 36.0 g/dL   RDW 18.3 (H) 11.5 - 14.5 %   Platelets 268 150 - 440 K/uL   Neutrophils Relative % 86 %   Neutro Abs 7.8 (H) 1.4 - 6.5 K/uL   Lymphocytes Relative 10 %   Lymphs Abs 0.9 (L) 1.0 - 3.6 K/uL   Monocytes Relative 4 %   Monocytes Absolute 0.4 0.2 - 1.0 K/uL   Eosinophils Relative 0 %   Eosinophils Absolute 0.0 0 - 0.7 K/uL   Basophils Relative 0 %   Basophils Absolute 0.0 0 - 0.1 K/uL  Comprehensive metabolic panel     Status: Abnormal   Collection Time: 09/26/17  9:32 PM  Result Value Ref Range   Sodium 140 135 - 145 mmol/L   Potassium 4.8 3.5 - 5.1 mmol/L   Chloride 109 98 - 111 mmol/L   CO2 24 22 - 32 mmol/L   Glucose, Bld 142 (H) 70 - 99 mg/dL   BUN 17 8 - 23 mg/dL   Creatinine, Ser 1.34 (H) 0.61 - 1.24 mg/dL   Calcium 8.6 (L) 8.9 - 10.3 mg/dL   Total Protein 6.6 6.5 - 8.1 g/dL   Albumin 3.6 3.5 - 5.0 g/dL   AST 16 15 - 41 U/L   ALT 8 0 - 44 U/L   Alkaline Phosphatase 60 38 - 126 U/L   Total Bilirubin 0.5 0.3 - 1.2 mg/dL   GFR calc non Af Amer 48 (L) >60 mL/min   GFR calc Af Amer 56 (L) >60 mL/min   Anion gap 7 5 - 15   Recent Results (from  the past 240 hour(s))  Microscopic Examination     Status: Abnormal   Collection Time: 09/26/17 11:05 AM  Result Value Ref Range Status   WBC, UA None seen 0 - 5 /hpf Final   RBC, UA >30 (H) 0 - 2 /hpf Final   Epithelial  Cells (non renal) 0-10 0 - 10 /hpf Final   Mucus, UA Present (A) Not Estab. Final   Bacteria, UA Moderate (A) None seen/Few Final   Creatinine: Recent Labs    09/26/17 2132  CREATININE 1.34*    Impression/Assessment/plan:  #1 gross hematuria- resolved for now.  Certainly high risk of recurrence.  #2 prostate cancer- daughter who is a Marine scientist on my opinion on treatment of prostate cancer.  He is exhausted most medical treatments and could consider simply staying on Lupron.  They were discussed with Dr. Grayland Ormond.  They asked about progression and we discussed that certainly the cancer will continue to progress and cause obstruction and bleeding given that they have discontinued treatment and the cancer was progressing while on treatment.  #3 urinary retention- they follow-up Monday in urology clinic per the daughter and can consider another voiding trial.  They asked about channel TURP and we discussed that may be difficult as it may be the prostate or the extension into the bladder that is bleeding and TURP risks anesthetic complications as well as bleeding among others.  Patient is stable from a urologic point of view for discharge.  I will sign off but please notify GU with any questions or concerns or changes in patient's status.  If he does stay overnight and we could consider voiding trial starting tonight/early tomorrow morning.   Festus Aloe 09/27/2017, 2:43 PM

## 2017-09-27 NOTE — ED Notes (Signed)
Transport to floor room 103.AS 

## 2017-09-27 NOTE — Progress Notes (Signed)
[place] at [place] NAME: Terry Wood    MR#:  [id number]  DATE OF BIRTH:  1933/10/23  SUBJECTIVE:  CHIEF COMPLAINT:   Chief Complaint  Patient presents with  . Hematuria   - hematuria persists - has a foley, not on CBI  REVIEW OF SYSTEMS:  Review of Systems  Constitutional: Negative for chills and fever.  HENT: Negative for hearing loss.   Eyes: Negative for blurred vision and double vision.  Respiratory: Negative for cough, shortness of breath and wheezing.   Cardiovascular: Negative for chest pain and palpitations.  Gastrointestinal: Negative for abdominal pain, constipation, diarrhea, nausea and vomiting.  Genitourinary: Positive for hematuria. Negative for dysuria.  Musculoskeletal: Negative for joint pain and myalgias.  Neurological: Negative for dizziness, focal weakness, seizures, weakness and headaches.  Psychiatric/Behavioral: Negative for depression.    DRUG ALLERGIES:  No Known Allergies  VITALS:  Blood pressure (!) 145/59, pulse (!) 112, temperature 98.1 F (36.7 C), temperature source Oral, resp. rate 18, height 5\' 8"  (1.727 m), weight 82.6 kg, SpO2 99 %.  PHYSICAL EXAMINATION:  Physical Exam  GENERAL:  84 y.o.-year-old patient lying in the bed with no acute distress.  EYES: Pupils equal, round, reactive to light and accommodation. No scleral icterus. Extraocular muscles intact.  HEENT: Head atraumatic, normocephalic. Oropharynx and nasopharynx clear.  NECK:  Supple, no jugular venous distention. No thyroid enlargement, no tenderness.  LUNGS: Normal breath sounds bilaterally, no wheezing, rales,rhonchi or crepitation. No use of accessory muscles of respiration. Decreased bibasilar breath sounds CARDIOVASCULAR: S1, S2 normal. No  rubs, or gallops. 2/6 systolic murmur present ABDOMEN: Soft, nontender, nondistended. Bowel sounds present. No organomegaly or mass.  EXTREMITIES: No pedal edema, cyanosis, or  clubbing.  NEUROLOGIC: Cranial nerves II through XII are intact. Muscle strength 5/5 in all extremities. Sensation intact. Gait not checked.  PSYCHIATRIC: The patient is alert and oriented x 3.  SKIN: No obvious rash, lesion, or ulcer.    LABORATORY PANEL:   CBC Recent Labs  Lab 09/26/17 2132  WBC 9.1  HGB 9.9*  HCT 29.2*  PLT 268   ------------------------------------------------------------------------------------------------------------------  Chemistries  Recent Labs  Lab 09/26/17 2132  NA 140  K 4.8  CL 109  CO2 24  GLUCOSE 142*  BUN 17  CREATININE 1.34*  CALCIUM 8.6*  AST 16  ALT 8  ALKPHOS 60  BILITOT 0.5   ------------------------------------------------------------------------------------------------------------------  Cardiac Enzymes No results for input(s): TROPONINI in the last 168 hours. ------------------------------------------------------------------------------------------------------------------  RADIOLOGY:  No results found.  EKG:   Orders placed or performed during the hospital encounter of 08/03/17  . EKG 12-Lead  . EKG 12-Lead    ASSESSMENT AND PLAN:   84 year old male with past medical history significant for prostate cancer not on chemotherapy currently, hypertension, diabetes presents with recurrent hematuria  1.  Hematuria-recurrent admission for the same.  Was seeing urologist as outpatient.  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 -Has a Foley in, urology consulted.  Depending upon the bleeding, CBI might be recommended. -Frequent hemoglobin checks as needed. -Discontinue aspirin  2.  History of prostate cancer-patient has elected to stop his chemotherapy.  Also concern for right kidney mass.  Latest CT showing increasing right-sided hydronephrosis and moderate left hydronephrosis secondary to possible bladder mass. -Patient has elected to stop his palliative radiation  and chemo at this time. -Management per urology -On Flomax  3.  Chronic  diarrhea-after his radiation.  Continue probiotics  4.  DVT prophylaxis-teds and SCDs only due to hematuria.  Encourage ambulation.   All the records are reviewed and case discussed with Care Management/Social Workerr. Management plans discussed with the patient, family and they are in agreement.  CODE STATUS: DNR  TOTAL TIME TAKING CARE OF THIS PATIENT: 38 minutes.   POSSIBLE D/C IN 2 DAYS, DEPENDING ON CLINICAL CONDITION.   Gladstone Lighter M.D on 09/27/2017 at 10:21 AM  Between 7am to 6pm - Pager - [phone]  After 6pm go to [email or web address] - password [email or web address] [facility]  Office  [phone]  CC: Primary care physician; System, Pcp Not In

## 2017-09-27 NOTE — Progress Notes (Signed)
Pt being discharged home, discharge instructions reviewed with pt and family, states understanding, pt with no complaints

## 2017-09-27 NOTE — Plan of Care (Signed)

## 2017-09-28 LAB — URINE CULTURE: Culture: NO GROWTH

## 2017-09-28 LAB — CULTURE, URINE COMPREHENSIVE

## 2017-09-28 NOTE — Discharge Summary (Signed)
[place] at [place] NAME: Terry Wood    MR#:  [id number]  DATE OF BIRTH:  1933/05/09  DATE OF ADMISSION:  09/26/2017   ADMITTING PHYSICIAN: Harrie Foreman, MD  DATE OF DISCHARGE: 09/27/2017  3:51 PM  PRIMARY CARE PHYSICIAN: System, Pcp Not In   ADMISSION DIAGNOSIS:   Hematuria, unspecified type [R31.9]  DISCHARGE DIAGNOSIS:   Active Problems:   Hematuria   SECONDARY DIAGNOSIS:   Past Medical History:  Diagnosis Date  . Cancer of prostate ([place]) 07/13/2014  . Diabetes mellitus without complication ([place])   . Hypertension     HOSPITAL COURSE:    84 year old male with past medical history significant for prostate cancer not on chemotherapy currently, hypertension, diabetes presents with recurrent hematuria  1.  Hematuria-recurrent admission for the same.   -Was seeing urologist as outpatient.  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 -Also concern for bladder mass causing bilateral hydronephrosis. -Foley catheter has been replaced.  A Foley was just placed recently due to urinary retention as outpatient.  Urine has cleared up very well. -Aspirin has been discontinued.  Hemoglobin has been stable.  Urology recommended outpatient follow-up. -Did not receive CBI this admission  2.  History of prostate cancer-patient has elected to stop his chemotherapy.  Also concern for right kidney mass.  Latest CT showing increasing right-sided hydronephrosis and moderate left hydronephrosis secondary to possible bladder mass. -Patient has elected to stop his palliative radiation and chemo at this time. -Management per urology -On Flomax  3.  Chronic diarrhea-started after his radiation.  Continue probiotics  Encourage ambulation.  Ambulating well with a walker. Based on urology recommendations, patient being discharged home today    DISCHARGE CONDITIONS:    Guarded  CONSULTS OBTAINED:   Treatment Team:  Festus Aloe, MD  DRUG ALLERGIES:   No Known Allergies DISCHARGE MEDICATIONS:   Allergies as of 09/27/2017   No Known Allergies     Medication List    STOP taking these medications   aspirin EC 81 MG tablet     TAKE these medications   acidophilus Caps capsule Take 1 capsule by mouth daily.   allopurinol 100 MG tablet Commonly known as:  ZYLOPRIM Take 100 mg by mouth daily.   Fish Oil 1000 MG Caps Take 2,000 mg by mouth 2 (two) times daily.   HYDROcodone-acetaminophen 5-325 MG tablet Commonly known as:  NORCO/VICODIN Take 1 tablet by mouth every 6 (six) hours as needed for moderate pain.   tamsulosin 0.4 MG Caps capsule Commonly known as:  FLOMAX Take 0.4 mg by mouth 2 (two) times daily.        DISCHARGE INSTRUCTIONS:   1.  Urology follow-up in 1 to 2 days 2.  PCP follow-up in 1 to 2 weeks  DIET:   Cardiac diet  ACTIVITY:   Activity as tolerated  OXYGEN:   Home Oxygen: No.  Oxygen Delivery: room air  DISCHARGE LOCATION:   home   If you experience worsening of your admission symptoms, develop shortness of breath, life threatening emergency, suicidal or homicidal thoughts you must seek medical attention immediately by calling 911 or calling your MD immediately  if symptoms less severe.  You Must read complete instructions/literature along with all the possible adverse reactions/side effects for all the Medicines you take and that have been prescribed to you. Take any new Medicines after you have completely understood and  accpet all the possible adverse reactions/side effects.   Please note  You were cared for by a hospitalist during your hospital stay. If you have any questions about your discharge medications or the care you received while you were in the hospital after you are discharged, you can call the unit and asked to speak with the hospitalist on call if the hospitalist that took care  of you is not available. Once you are discharged, your primary care physician will handle any further medical issues. Please note that NO REFILLS for any discharge medications will be authorized once you are discharged, as it is imperative that you return to your primary care physician (or establish a relationship with a primary care physician if you do not have one) for your aftercare needs so that they can reassess your need for medications and monitor your lab values.    On the day of Discharge:  VITAL SIGNS:   Blood pressure 119/69, pulse 67, temperature 98 F (36.7 C), temperature source Oral, resp. rate 18, height 5\' 8"  (1.727 m), weight 82.6 kg, SpO2 98 %.  PHYSICAL EXAMINATION:   GENERAL:  84 y.o.-year-old patient lying in the bed with no acute distress.  EYES: Pupils equal, round, reactive to light and accommodation. No scleral icterus. Extraocular muscles intact.  HEENT: Head atraumatic, normocephalic. Oropharynx and nasopharynx clear.  NECK:  Supple, no jugular venous distention. No thyroid enlargement, no tenderness.  LUNGS: Normal breath sounds bilaterally, no wheezing, rales,rhonchi or crepitation. No use of accessory muscles of respiration. Decreased bibasilar breath sounds CARDIOVASCULAR: S1, S2 normal. No  rubs, or gallops. 2/6 systolic murmur present ABDOMEN: Soft, nontender, nondistended. Bowel sounds present. No organomegaly or mass.  EXTREMITIES: No pedal edema, cyanosis, or clubbing.  NEUROLOGIC: Cranial nerves II through XII are intact. Muscle strength 5/5 in all extremities. Sensation intact. Gait not checked.  PSYCHIATRIC: The patient is alert and oriented x 3.  SKIN: No obvious rash, lesion, or ulcer.   DATA REVIEW:   CBC Recent Labs  Lab 09/26/17 2132  WBC 9.1  HGB 9.9*  HCT 29.2*  PLT 268    Chemistries  Recent Labs  Lab 09/26/17 2132  NA 140  K 4.8  CL 109  CO2 24  GLUCOSE 142*  BUN 17  CREATININE 1.34*  CALCIUM 8.6*  AST 16  ALT 8   ALKPHOS 60  BILITOT 0.5     Microbiology Results  Results for orders placed or performed during the hospital encounter of 09/26/17  Urine culture     Status: None   Collection Time: 09/26/17  4:58 PM  Result Value Ref Range Status   Specimen Description   Final    URINE, CATHETERIZED Performed at [facility], [address], [place], [place] [address]    Special Requests   Final    NONE Performed at [facility], [address], [place], [place] [address]    Culture   Final    NO GROWTH Performed at [facility], [place] [address] [place], [place] [address]    Report Status 09/28/2017 FINAL  Final    RADIOLOGY:  No results found.   Management plans discussed with the patient, family and they are in agreement.  CODE STATUS:  Code Status History    Date Active Date Inactive Code Status Order ID Comments User Context   09/27/2017 0406 09/27/2017 1856 DNR [id number]  Harrie Foreman, MD Inpatient   08/04/2017 1126 08/14/2017 2041 DNR [id number]  Sudini,  Srikar, MD Inpatient   08/04/2017 0403 08/04/2017 1126 Full Code [id number]  Harrie Foreman, MD Inpatient    Questions for Most Recent Historical Code Status (Order [id number])    Question Answer Comment   In the event of cardiac or respiratory ARREST Do not call a "code blue"    In the event of cardiac or respiratory ARREST Do not perform Intubation, CPR, defibrillation or ACLS    In the event of cardiac or respiratory ARREST Use medication by any route, position, wound care, and other measures to relive pain and suffering. May use oxygen, suction and manual treatment of airway obstruction as needed for comfort.         Advance Directive Documentation     Most Recent Value  Type of Advance Directive  Healthcare Power of Attorney, Living will  Pre-existing out of facility DNR order (yellow form or pink MOST form)  -  "MOST" Form in Place?  -      TOTAL TIME TAKING CARE OF THIS PATIENT: 38  minutes.    Gladstone Lighter M.D on 09/28/2017 at 1:31 PM  Between 7am to 6pm - Pager - [phone]  After 6pm go to [email or web address] - Proofreader  [facility]  Office  [phone]  CC: Primary care physician; System, Pcp Not In   Note: This dictation was prepared with Dragon dictation along with smaller phrase technology. Any transcriptional errors that result from this process are unintentional.

## 2017-09-29 ENCOUNTER — Encounter: Payer: Self-pay | Admitting: Urology

## 2017-09-29 ENCOUNTER — Ambulatory Visit (INDEPENDENT_AMBULATORY_CARE_PROVIDER_SITE_OTHER): Payer: Medicare Other | Admitting: Urology

## 2017-09-29 VITALS — BP 125/76 | HR 90 | Ht 68.0 in | Wt 182.4 lb

## 2017-09-29 DIAGNOSIS — R338 Other retention of urine: Secondary | ICD-10-CM | POA: Diagnosis not present

## 2017-09-29 DIAGNOSIS — N2889 Other specified disorders of kidney and ureter: Secondary | ICD-10-CM | POA: Diagnosis not present

## 2017-09-29 DIAGNOSIS — N3289 Other specified disorders of bladder: Secondary | ICD-10-CM

## 2017-09-29 DIAGNOSIS — C61 Malignant neoplasm of prostate: Secondary | ICD-10-CM

## 2017-09-29 DIAGNOSIS — N1330 Unspecified hydronephrosis: Secondary | ICD-10-CM | POA: Diagnosis not present

## 2017-09-29 DIAGNOSIS — R310 Gross hematuria: Secondary | ICD-10-CM

## 2017-09-29 LAB — BLADDER SCAN AMB NON-IMAGING: SCAN RESULT: 30

## 2017-09-29 NOTE — Progress Notes (Signed)
Fill and Pull Catheter Removal  Patient is present today for a catheter removal.  Patient was cleaned and prepped in a sterile fashion 154ml of sterile water/ saline was instilled into the bladder when the patient felt the urge to urinate. 3ml of water was then drained from the balloon.  A 18FR foley cath was removed from the bladder complications were noted as: pt experienced bladder spasms and had to remove catherer .  Patient as then given some time to void on their own.  Patient can void  176ml on their own after some time.  Patient tolerated well.  Preformed by: Fonnie Jarvis cma  Follow up/ Additional notes: pvr afternoon

## 2017-09-29 NOTE — Progress Notes (Signed)
09/29/2017 4:58 PM   Terry Wood. [birth year] [id number]  Referring provider: No referring provider defined for this encounter.  Chief Complaint  Patient presents with  . Follow-up    HPI: Patient is an 84 year old Caucasian male with urinary retention, hematuria, metastatic prostate cancer, right renal mass and a left sided bladder mass with hydronephrosis who presents today after being seen in the ED for gross hematuria.    Background history He was hospitalized for AKI on CKD, hematuria, urinary retention and a small bowel obstruction and discharged on 08/14/2017.  Hematuria resolved with CBI and irrigation.  He did fail a TOV while in the hospital.  He is currently on tamsulosin.    Contrast CT in 03/2017 noted 3.1 x 3.6 x 3.8 cm exophytic right lower pole renal mass (series 4/image 27), compatible with solid renal neoplasm such as renal cell carcinoma.  2.9 x 4.1 cm left posterolateral bladder mass (series 2/image 90), grossly unchanged. Thick-walled bladder anteriorly.  Enhancing lesion along the left anterolateral prostate (series 2/image 93), likely corresponding to the patient's known prostate cancer. This is directly contiguous with the patient's left posterolateral bladder mass.  Non contrast CT on 08/09/2017 noted interval increase in RIGHT-sided hydronephrosis, moderate left hydronephrosis likely secondary to known mass within the urinary bladder.  Heterogeneous contents of the urinary bladder, consistent with known hematuria and mass in the region of the LEFT ureterovesical junction. This mass is not as well evaluated today on this noncontrast exam.  Small bowel obstruction to level of the RIGHT LOWER QUADRANT, possibly related to adhesions. No definite mass identified as a point of obstruction. solid mass in the LOWER pole the RIGHT kidney, suspicious for renal cell carcinoma.  New osseous metastatic disease.  Coronary artery disease.  Aortic atherosclerosis.   He  had recently completed palliative radiation and is receiving cabazitaxel.  On 09/11/2017, he was seen at the Cancer center.  He has elected to stop all intervention at this time.    Creatinine is 1.64.  PSA is 18.25.    BPH WITH LUTS  (prostate and/or bladder) Previous IPSS score: 11/5   Previous PVR: 44 mL   Major complaint(s):  He wants the catheter out. Denies any recent fevers, chills, nausea or vomiting.  Urinary retention Patient presented to the office on 09/26/2017 with the complaint of hematuria and catheter not draining.  A Foley was placed and a return of 500 cc urine and it was irrigated.  He then presented to the ED on 09/26/2017 for hematuria.  He was admitted and urine cleared without CBI.  He passed a fill and pull today in the office, so the catheter was removed.  We will also have him restart ADT at this time.    PMH: Past Medical History:  Diagnosis Date  . Cancer of prostate ([place]) 07/13/2014  . Diabetes mellitus without complication ([place])   . Hypertension     Surgical History: No past surgical history on file.  Home Medications:  Allergies as of 09/29/2017   No Known Allergies     Medication List        Accurate as of 09/29/17 11:59 PM. Always use your most recent med list.          acidophilus Caps capsule Take 1 capsule by mouth daily.   allopurinol 100 MG tablet Commonly known as:  ZYLOPRIM Take 100 mg by mouth daily.   Fish Oil 1000 MG Caps Take 2,000 mg by mouth 2 (  two) times daily.   HYDROcodone-acetaminophen 5-325 MG tablet Commonly known as:  NORCO/VICODIN Take 1 tablet by mouth every 6 (six) hours as needed for moderate pain.   tamsulosin 0.4 MG Caps capsule Commonly known as:  FLOMAX Take 0.4 mg by mouth 2 (two) times daily.       Allergies: No Known Allergies  Family History: No family history on file.  Social History:  reports that he quit smoking about 2 months ago. His smoking use included cigarettes. He has never used  smokeless tobacco. He reports that he drank alcohol. He reports that he does not use drugs.  ROS: UROLOGY Frequent Urination?: No Hard to postpone urination?: No Burning/pain with urination?: Yes Get up at night to urinate?: No Leakage of urine?: No Urine stream starts and stops?: No Trouble starting stream?: No Do you have to strain to urinate?: No Blood in urine?: No Urinary tract infection?: No Sexually transmitted disease?: No Injury to kidneys or bladder?: No Painful intercourse?: No Weak stream?: No Erection problems?: No Penile pain?: No  Gastrointestinal Nausea?: No Vomiting?: No Indigestion/heartburn?: No Diarrhea?: No Constipation?: No  Constitutional Fever: No Night sweats?: No Weight loss?: No Fatigue?: No  Skin Skin rash/lesions?: No Itching?: No  Eyes Blurred vision?: No Double vision?: No  Ears/Nose/Throat Sore throat?: No Sinus problems?: No  Hematologic/Lymphatic Swollen glands?: No Easy bruising?: No  Cardiovascular Leg swelling?: No Chest pain?: No  Respiratory Cough?: No Shortness of breath?: No  Endocrine Excessive thirst?: No  Musculoskeletal Back pain?: No Joint pain?: No  Neurological Headaches?: No Dizziness?: No  Psychologic Depression?: No Anxiety?: No  Physical Exam: BP 125/76 (BP Location: Left Arm, Patient Position: Sitting, Cuff Size: Normal)   Pulse 90   Ht 5\' 8"  (1.727 m)   Wt 182 lb 6.4 oz (82.7 kg)   BMI 27.73 kg/m   Constitutional: Well nourished. Alert and oriented, No acute distress. HEENT: [place] AT, moist mucus membranes. Trachea midline, no masses. Cardiovascular: No clubbing, cyanosis, or edema. Respiratory: Normal respiratory effort, no increased work of breathing. Skin: No rashes, bruises or suspicious lesions. Lymph: No cervical or inguinal adenopathy. Neurologic: Grossly intact, no focal deficits, moving all 4 extremities. Psychiatric: Normal mood and affect.  Laboratory Data: Lab  Results  Component Value Date   WBC 9.1 09/26/2017   HGB 9.9 (L) 09/26/2017   HCT 29.2 (L) 09/26/2017   MCV 97.2 09/26/2017   PLT 268 09/26/2017    Lab Results  Component Value Date   CREATININE 1.34 (H) 09/26/2017    Lab Results  Component Value Date   PSA 0.95 05/21/2016   PSA 0.28 02/22/2016   PSA 0.19 11/21/2015    No results found for: TESTOSTERONE  Lab Results  Component Value Date   HGBA1C 6.7 (H) 08/03/2017    Lab Results  Component Value Date   TSH 1.260 08/03/2017    No results found for: CHOL, HDL, CHOLHDL, VLDL, LDLCALC  Lab Results  Component Value Date   AST 16 09/26/2017   Lab Results  Component Value Date   ALT 8 09/26/2017   No components found for: ALKALINEPHOPHATASE No components found for: BILIRUBINTOTAL  No results found for: ESTRADIOL  I have reviewed the labs.   Assessment & Plan:    1. Urinary retention Foley removed today Will start ADT in the hopes in will reduce mass effect in the bladder Patient will RTC on Monday for Firmagon  2. Gross hematuria Occurred on 09/26/2017 - cleared without CBI  3.  Metastatic prostate cancer Managed through the Cancer  - has decided to discontinue treatments at this time He did agree to ADT   4. Left hydronephrosis Asymptomatic left hydronephrosis since at least 3/22019 which does not appear to have been address Still present on RUS in 07/2017 - creatinine 1.34 Patient did not want any intervention unless he started to experience pain and/or a decrease in renal function   5. Right renal mass 3.8 cm right renal mass, slow interval growth Recommend continued observation Repeat imaging in 6 months  (01/2018)  6. Bladder mass Most likely prostate cancer infiltrating the bladder May consider cystoscopy in the future if hematuria persists   Return for Monday for Firmagon.  These notes generated with voice recognition software. I apologize for typographical errors.  Zara Council,  PA-C  [facility] [address]  [place], [place] [address] [phone]

## 2017-09-30 ENCOUNTER — Encounter: Payer: Self-pay | Admitting: Oncology

## 2017-09-30 ENCOUNTER — Telehealth: Payer: Self-pay | Admitting: Urology

## 2017-09-30 NOTE — Telephone Encounter (Signed)
I was speaking to Terry Wood regarding starting [name] and he is agreeable to the injection.  We were cut off before I could tell him his appointment time.  It is Monday (10/06/2017) at 10:00 am.  He may take a Vicodin prior to the injection to help with the discomfort.

## 2017-09-30 NOTE — Telephone Encounter (Signed)
Pt requesting a call from you directly.  He doesn't want to speak with anyone clinical, just you.  His cell # [phone].  He would not tell me what this was concerning.

## 2017-10-01 NOTE — Telephone Encounter (Signed)
Patient is aware, but he was unsure if he had any pain meds or not. He said he would look.    Sharyn Lull

## 2017-10-06 ENCOUNTER — Ambulatory Visit (INDEPENDENT_AMBULATORY_CARE_PROVIDER_SITE_OTHER): Payer: Medicare Other | Admitting: Family Medicine

## 2017-10-06 VITALS — BP 124/78 | HR 76 | Ht 68.0 in | Wt 183.0 lb

## 2017-10-06 DIAGNOSIS — C61 Malignant neoplasm of prostate: Secondary | ICD-10-CM | POA: Diagnosis not present

## 2017-10-06 LAB — BLADDER SCAN AMB NON-IMAGING: SCAN RESULT: 16

## 2017-10-06 MED ORDER — DEGARELIX ACETATE 120 MG ~~LOC~~ SOLR
240.0000 mg | Freq: Once | SUBCUTANEOUS | Status: AC
  Administered 2017-10-06: 240 mg via SUBCUTANEOUS

## 2017-10-06 NOTE — Progress Notes (Signed)
Firmagon Sub Q Injection  Due to Prostate Cancer patient is present today for a Firmagon Injection.   Medication: [name] (Degarelix)  Dose: 240mg  Location: right and left upper abdomen Lot: [id number] Exp: 12/2019  Patient tolerated well, no complications were noted  Performed by: Elberta Leatherwood, CMA  Follow up: 1 month Firmagon 80mg   Bladder Scan Patient can void: 16 ml Performed By: Elberta Leatherwood, CMA

## 2017-10-13 ENCOUNTER — Encounter: Payer: Self-pay | Admitting: Oncology

## 2017-11-05 ENCOUNTER — Ambulatory Visit (INDEPENDENT_AMBULATORY_CARE_PROVIDER_SITE_OTHER): Payer: Medicare Other

## 2017-11-05 DIAGNOSIS — C61 Malignant neoplasm of prostate: Secondary | ICD-10-CM

## 2017-11-05 MED ORDER — DEGARELIX ACETATE 80 MG ~~LOC~~ SOLR
80.0000 mg | Freq: Once | SUBCUTANEOUS | Status: AC
  Administered 2017-11-05: 80 mg via SUBCUTANEOUS

## 2017-11-05 NOTE — Progress Notes (Signed)
Firmagon Sub Q Injection  Due to Prostate Cancer patient is present today for a Firmagon Injection.   Medication: Firmagon (Degarelix)  Dose: 80mg  Location: right upper abdomen Lot: [id number]   Patient tolerated well, no complications were noted  Performed by: Fonnie Jarvis, CMA  Follow up: 1 month w/ PSA and Testosterone switch to 6 mo Lupron

## 2017-12-08 ENCOUNTER — Other Ambulatory Visit: Payer: Self-pay | Admitting: Family Medicine

## 2017-12-08 DIAGNOSIS — C61 Malignant neoplasm of prostate: Secondary | ICD-10-CM

## 2017-12-09 ENCOUNTER — Other Ambulatory Visit: Payer: Medicare Other

## 2017-12-09 DIAGNOSIS — C61 Malignant neoplasm of prostate: Secondary | ICD-10-CM

## 2017-12-10 ENCOUNTER — Telehealth: Payer: Self-pay

## 2017-12-10 LAB — PSA: Prostate Specific Ag, Serum: 73.2 ng/mL — ABNORMAL HIGH (ref 0.0–4.0)

## 2017-12-10 NOTE — Progress Notes (Signed)
12/11/2017   Terry Wood. 1933/10/27 [id number]  Referring provider: No referring provider defined for this encounter.  Chief Complaint  Patient presents with  . Follow-up  . acute urinary retention    HPI: Patient is an 84 y.o. Caucasian male with urinary retention, hematuria, metastatic prostate cancer, right renal mass and a left sided bladder mass with hydronephrosis who presents today for his one month follow up  Hematuria history He was hospitalized for AKI on CKD, hematuria, urinary retention and a small bowel obstruction and discharged on 08/14/2017.  Hematuria resolved with CBI and irrigation.  He did fail a TOV while in the hospital.  He denies any further gross hematuria.   Renal and bladder mass with hydronephrosis history Contrast CT in 03/2017 noted 3.1 x 3.6 x 3.8 cm exophytic right lower pole renal mass (series 4/image 27), compatible with solid renal neoplasm such as renal cell carcinoma.  2.9 x 4.1 cm left posterolateral bladder mass (series 2/image 90), grossly unchanged. Thick-walled bladder anteriorly.  Enhancing lesion along the left anterolateral prostate (series 2/image 93), likely corresponding to the patient's known prostate cancer. This is directly contiguous with the patient's left posterolateral bladder mass.  Non contrast CT on 08/09/2017 noted interval increase in RIGHT-sided hydronephrosis, moderate left hydronephrosis likely secondary to known mass within the urinary bladder.  Heterogeneous contents of the urinary bladder, consistent with known hematuria and mass in the region of the LEFT ureterovesical junction. This mass is not as well evaluated today on this noncontrast exam.  Small bowel obstruction to level of the RIGHT LOWER QUADRANT, possibly related to adhesions. No definite mass identified as a point of obstruction. solid mass in the LOWER pole the RIGHT kidney, suspicious for renal cell carcinoma.  New osseous metastatic disease.  Coronary  artery disease.  Aortic atherosclerosis.   Patient has decided to discontinue all monitoring and/or treatment for his cancer including further surveillance of the renal/bladder mass.    Prostate cancer  Castrate resistant metastatic prostate cancer.  Patient has decided to end all further surveillance and treatment at this time.   PSA trend:  73.2 ng/mL on 12/09/2017 - after Firmagon   27.10 ng/mL on 08/21/17  18.25 ng/mL on 09/11/17  73.2 ng/mL on 12/09/2017  BPH WITH LUTS  (prostate and/or bladder) Patient denies any gross hematuria, dysuria or suprapubic/flank pain.  Patient denies any fevers, chills, nausea or vomiting. Denies any recent fevers, chills, nausea or vomiting.  Urinary retention Patient presented to the office on 09/26/2017 with the complaint of hematuria and catheter not draining.  A Foley was placed and a return of 500 cc urine and it was irrigated.  He then presented to the ED on 09/26/2017 for hematuria.  He was admitted and urine cleared without CBI.  He passed a fill and pull in the office, so the catheter was removed.  He is voiding well.  PVR is 170 mL.     PMH: Past Medical History:  Diagnosis Date  . Cancer of prostate ([place]) 07/13/2014  . Diabetes mellitus without complication ([place])   . Hypertension    Surgical History: No past surgical history on file.  Home Medications:  Allergies as of 12/11/2017   No Known Allergies     Medication List        Accurate as of 12/11/17 11:42 AM. Always use your most recent med list.          allopurinol 100 MG tablet Commonly known as:  ZYLOPRIM Take  100 mg by mouth daily.   Fish Oil 1000 MG Caps Take 2,000 mg by mouth 2 (two) times daily.   HYDROcodone-acetaminophen 5-325 MG tablet Commonly known as:  NORCO/VICODIN Take 1 tablet by mouth every 6 (six) hours as needed for moderate pain.   tamsulosin 0.4 MG Caps capsule Commonly known as:  FLOMAX Take 0.4 mg by mouth 2 (two) times daily.      Allergies:  No Known Allergies  Family History: No family history on file.  Social History:  reports that he quit smoking about 4 months ago. His smoking use included cigarettes. He has never used smokeless tobacco. He reports that he drank alcohol. He reports that he does not use drugs.  ROS: UROLOGY Frequent Urination?: No Hard to postpone urination?: No Burning/pain with urination?: No Get up at night to urinate?: No Leakage of urine?: No Urine stream starts and stops?: No Trouble starting stream?: No Do you have to strain to urinate?: No Blood in urine?: No Urinary tract infection?: No Sexually transmitted disease?: No Injury to kidneys or bladder?: No Painful intercourse?: No Weak stream?: No Erection problems?: No Penile pain?: No  Gastrointestinal Nausea?: No Vomiting?: No Indigestion/heartburn?: No Diarrhea?: No Constipation?: No  Constitutional Fever: No Night sweats?: No Weight loss?: No Fatigue?: Yes  Skin Skin rash/lesions?: No Itching?: No  Eyes Blurred vision?: No Double vision?: No  Ears/Nose/Throat Sore throat?: No Sinus problems?: No  Hematologic/Lymphatic Swollen glands?: No Easy bruising?: Yes  Cardiovascular Leg swelling?: Yes Chest pain?: No  Respiratory Cough?: No Shortness of breath?: No  Endocrine Excessive thirst?: No  Musculoskeletal Back pain?: No Joint pain?: Yes  Neurological Headaches?: No Dizziness?: No  Psychologic Depression?: No Anxiety?: No  Physical Exam: BP (!) 150/76 (BP Location: Left Arm, Patient Position: Sitting, Cuff Size: Normal)   Pulse (!) 101   Ht 5\' 8"  (1.727 m)   Wt 189 lb (85.7 kg)   BMI 28.74 kg/m   Constitutional: Well nourished. Alert and oriented, No acute distress. HEENT: [place] AT, moist mucus membranes. Trachea midline, no masses. Cardiovascular: No clubbing, cyanosis, or edema. Respiratory: Normal respiratory effort, no increased work of breathing. Skin: No rashes, bruises or  suspicious lesions. Neurologic: Grossly intact, no focal deficits, moving all 4 extremities. Psychiatric: Normal mood and affect.  Assessment & Plan:    1. Urinary retention Patient voiding well   2. Gross hematuria No reports of gross hematuria  3. Metastatic prostate cancer Patient has decided to end all treatments at this time.  We discussed what to expect with the progression of the prostate cancer, such as urinary retention, organ failure, pathological fractures, pain and/or memory changes.  I have offered referral to palliative care or hospice and patient has declined at this time.  He will contact us or the cancer center for the referral in the future.    4. Left hydronephrosis Asymptomatic left hydronephrosis since at least 3/22019 which does not appear to have been addressed Still present on RUS in 07/2017 - creatinine 1.34 Patient does not want any surveillance/intervention unless he started to experience pain  5. Right renal mass 3.8 cm right renal mass, slow interval growth Recommend continued observation Repeat imaging in 6 months  (01/2018) - he has declined any further workup or imaging in the future   6. Bladder mass Most likely prostate cancer infiltrating the bladder May consider cystoscopy in the future if hematuria persists - patient has declined any further workup  Did advise he could experience bleeding and  possible hospitalization in the future - he voiced his understanding   Return if symptoms worsen or fail to improve.  These notes generated with voice recognition software. I apologize for typographical errors.  Laneta Simmers  [facility] [address]  [place], [place] [address] [phone]  I, Temidayo Atanda-Ogunleye , am acting as a scribe for Nori Riis, PA-C  I have reviewed the above documentation for accuracy and completeness, and I agree with the above.    Zara Council, PA-C

## 2017-12-10 NOTE — Telephone Encounter (Signed)
-----   Message from Nori Riis, PA-C sent at 12/10/2017  7:48 AM EST ----- Please add a testosterone to his blood work.

## 2017-12-10 NOTE — Telephone Encounter (Signed)
Testosterone added.

## 2017-12-11 ENCOUNTER — Encounter: Payer: Self-pay | Admitting: Urology

## 2017-12-11 ENCOUNTER — Ambulatory Visit (INDEPENDENT_AMBULATORY_CARE_PROVIDER_SITE_OTHER): Payer: Medicare Other | Admitting: Urology

## 2017-12-11 VITALS — BP 150/76 | HR 101 | Ht 68.0 in | Wt 189.0 lb

## 2017-12-11 DIAGNOSIS — R338 Other retention of urine: Secondary | ICD-10-CM

## 2017-12-11 LAB — BLADDER SCAN AMB NON-IMAGING

## 2017-12-13 LAB — SPECIMEN STATUS REPORT

## 2017-12-13 LAB — TESTOSTERONE

## 2018-01-26 ENCOUNTER — Encounter: Payer: Self-pay | Admitting: Oncology

## 2018-01-26 ENCOUNTER — Other Ambulatory Visit: Payer: Self-pay | Admitting: *Deleted

## 2018-01-26 DIAGNOSIS — C61 Malignant neoplasm of prostate: Secondary | ICD-10-CM

## 2018-02-20 ENCOUNTER — Other Ambulatory Visit: Payer: Medicare Other | Admitting: Student

## 2018-02-20 ENCOUNTER — Telehealth: Payer: Self-pay | Admitting: *Deleted

## 2018-02-20 DIAGNOSIS — Z515 Encounter for palliative care: Secondary | ICD-10-CM

## 2018-02-20 NOTE — Progress Notes (Signed)
[facility] Consult Note Telephone: [phone]  Fax: [phone]  PATIENT NAME: Terry Wood. DOB: 12-23-1933 MRN: [id number]  PRIMARY CARE PROVIDER: [facility]  REFERRING PROVIDER:  Dr. Grayland Ormond  RESPONSIBLE PARTY: Self    ASSESSMENT:  Terry Wood is alert and oriented x 3. Met with patient, daughter Margarita Grizzle, daughter in law Margreta Journey and sister Lenette. Explained role of Palliative Medicine. We discussed his advanced disease process; he is not receiving any aggressive treatments. We discussed symptom management. We discussed Hospice services; he does agree to Hospice assessment. We discussed code status; he is a DNR; golden rod filled out and left in the home.    RECOMMENDATIONS and PLAN:  1. Code Status: DNR; filled out and left in the home.  2. Medical goals of therapy: Terry Wood is being referred for Hospice assessment due to his metastatic prostate cancer. 3. Symptom management. Continue ibuprofen or acetaminophen prn; he is encouraged to use his norco for moderate pain. 4. Discharge planning: Terry Wood will continue to reside in the home. 5. Emotional support: Discussed with Terry Wood and family; they are encouraged to call with questions.   I spent 75 minutes providing this consultation,  from 10:00am to 11:15am. More than 50% of the time in this consultation was spent coordinating communication.   HISTORY OF PRESENT ILLNESS:  Terry Kmetz. is a 85 y.o. year male with multiple medical problems including metastatic prostate cancer, right renal mass, left bladder mass, hydronephrosis, osseous metastatic disease, hematuria, CAD, hypertension, diabetes. He was hospitalized 9/20 to 09/27/17 due to hematuria. Hospitalized 7/28 to 08/14/17 due hematuria, AKI, malnutrition, SBO, urinary retention. He was seen by Urology in 12/2017. Daughter in law states that he does not have any scheduled appointments with Oncology; he  has decided against any further treatment/ monitoring of his cancer. PSA-73.2 ng/ml on 12.3.19 after firmagon. Terry Wood states he has received chemotherapy, radiation and hormones (firmagon) to treat his cancer. He denies hematuria at present, but states he voids small amounts every two hours. He reports pain to his joints, shoulder, elbow. He also reports an occasional pain that radiates down his right arm that "is numb and tingling." He states pain at its worse is a 5 or 6; would like for pain to be a zero. He is taking either ibuprofen or acetaminophen in the morning and at bedtime. He states he "just deals with it." He has norco in the home, has only taken it once, but does not want to use it if he does not need it. He also expresses not wanting to alter his thinking; he wants to be able to make his own decisions; also does not want to fall. He has family with him usually during the day, but is alone at night. He has had two falls in the past year. He reports a fair appetite, which has improved some. He is eating foods he likes/enjoys. He had lost weight from 220 down to 178 pounds and has was back up to 189 pounds in December, 2019. He uses rollator walker for ambulation. He reports weakness; fatigues easily. He and family state he is sleeping more. He reports edema to lower extremities that worsens during the day. He also reports a "cyst" to his back that was noticed in the past month. Daughter Margarita Grizzle, daughter in law Margreta Journey and sister Rolanda Lundborg express patient needing more assistance and feels he needs Hospice. Terry Wood expresses not wanting morphine used due  to his experience with his wife. No other agencies in the home. Palliative Care was asked to help address goals of care.   CODE STATUS: DNR  PPS: 50% HOSPICE ELIGIBILITY/DIAGNOSIS: TBD  PAST MEDICAL HISTORY:  Past Medical History:  Diagnosis Date  . Cancer of prostate ([facility]) 07/13/2014  . Diabetes mellitus without complication ([place])   .  Hypertension     SOCIAL HX:  Social History   Tobacco Use  . Smoking status: Former Smoker    Types: Cigarettes    Last attempt to quit: 08/07/2017    Years since quitting: 0.5  . Smokeless tobacco: Never Used  Substance Use Topics  . Alcohol use: Not Currently    Alcohol/week: 0.0 standard drinks    ALLERGIES: No Known Allergies   PERTINENT MEDICATIONS:  Outpatient Encounter Medications as of 02/20/2018  Medication Sig  . acetaminophen (TYLENOL) 650 MG CR tablet Take 1,300 mg by mouth daily as needed for pain.  Marland Kitchen allopurinol (ZYLOPRIM) 100 MG tablet Take 100 mg by mouth daily.   Marland Kitchen HYDROcodone-acetaminophen (NORCO/VICODIN) 5-325 MG tablet Take 1 tablet by mouth every 6 (six) hours as needed for moderate pain.  Marland Kitchen ibuprofen (ADVIL,MOTRIN) 200 MG tablet Take 200 mg by mouth as needed.  . Omega-3 Fatty Acids (FISH OIL) 1000 MG CAPS Take 2,000 mg by mouth 2 (two) times daily.   . tamsulosin (FLOMAX) 0.4 MG CAPS capsule Take 0.4 mg by mouth 2 (two) times daily.  . [DISCONTINUED] prochlorperazine (COMPAZINE) 10 MG tablet Take 1 tablet (10 mg total) every 6 (six) hours as needed by mouth (Nausea or vomiting).   Facility-Administered Encounter Medications as of 02/20/2018  Medication  . ondansetron (ZOFRAN) injection 8 mg    PHYSICAL EXAM:   General: NAD Cardiovascular: regular rate and rhythm Pulmonary: clear ant fields Abdomen: soft, nontender, + bowel sounds GU: no suprapubic tenderness Extremities: trace edema, no joint deformities Skin: no rashes, firm nodule right upper back Neurological: Weakness but otherwise nonfocal  Ezekiel Slocumb, NP

## 2018-02-20 NOTE — Telephone Encounter (Signed)
Palliative Care went to evaluate patient and recommends Hospice referral.Please send referral/ orders if in agreement.

## 2018-02-23 NOTE — Telephone Encounter (Signed)
Dr. Grayland Ormond, can I go ahead and send the referral to Hospice? Please advise?

## 2018-02-23 NOTE — Telephone Encounter (Signed)
Referral to hospice was faxed.

## 2018-02-23 NOTE — Telephone Encounter (Signed)
Yes please. Thank you

## 2018-02-25 ENCOUNTER — Other Ambulatory Visit: Payer: Self-pay | Admitting: *Deleted

## 2018-02-25 MED ORDER — TRAMADOL HCL 50 MG PO TABS: 50.0000 mg | ORAL_TABLET | Freq: Four times a day (QID) | ORAL | 1 refills | Status: DC | PRN

## 2018-02-25 NOTE — Telephone Encounter (Signed)
Hospice nurse Opal Sidles called reporting that patient is not comfortable taking Norco and would prefer Tramadol instead. Please advise

## 2018-02-25 NOTE — Telephone Encounter (Signed)
That's fine

## 2018-05-06 ENCOUNTER — Other Ambulatory Visit: Payer: Self-pay | Admitting: Oncology

## 2018-05-12 ENCOUNTER — Telehealth: Payer: Self-pay | Admitting: *Deleted

## 2018-05-12 MED ORDER — OMEPRAZOLE 20 MG PO CPDR: 20.0000 mg | DELAYED_RELEASE_CAPSULE | Freq: Every day | ORAL | 6 refills | Status: AC

## 2018-05-12 NOTE — Telephone Encounter (Signed)
Tina with Hospice called asking for order for Omeprazole for this patient who has acid reflux

## 2018-05-12 NOTE — Telephone Encounter (Signed)
That's fine

## 2018-05-28 ENCOUNTER — Other Ambulatory Visit: Payer: Self-pay | Admitting: *Deleted

## 2018-05-28 MED ORDER — HYDROCODONE-ACETAMINOPHEN 5-325 MG PO TABS: 1.0000 | ORAL_TABLET | Freq: Four times a day (QID) | ORAL | 0 refills | Status: AC | PRN

## 2018-05-28 NOTE — Telephone Encounter (Signed)
Hospice nurse called reporting that patient not wanting to take Tramadol because it does not work. Daughter found an old bottle of Norco in house and it worked to control his pain better. Asking if you will order Norco for patient pain. Please advise

## 2018-05-28 NOTE — Telephone Encounter (Signed)
That is fine.  He can have 60 tabs. q6hrs prn

## 2018-05-31 ENCOUNTER — Encounter: Payer: Self-pay | Admitting: Emergency Medicine

## 2018-05-31 ENCOUNTER — Emergency Department

## 2018-05-31 ENCOUNTER — Other Ambulatory Visit: Payer: Self-pay

## 2018-05-31 ENCOUNTER — Emergency Department
Admission: EM | Admit: 2018-05-31 | Discharge: 2018-05-31 | Disposition: A | Discharge status: Home / Self Care | Attending: Emergency Medicine | Admitting: Emergency Medicine

## 2018-05-31 DIAGNOSIS — N179 Acute kidney failure, unspecified: Secondary | ICD-10-CM | POA: Diagnosis not present

## 2018-05-31 DIAGNOSIS — Z1159 Encounter for screening for other viral diseases: Secondary | ICD-10-CM | POA: Insufficient documentation

## 2018-05-31 DIAGNOSIS — R339 Retention of urine, unspecified: Secondary | ICD-10-CM

## 2018-05-31 DIAGNOSIS — E119 Type 2 diabetes mellitus without complications: Secondary | ICD-10-CM | POA: Diagnosis not present

## 2018-05-31 DIAGNOSIS — Z87891 Personal history of nicotine dependence: Secondary | ICD-10-CM | POA: Diagnosis not present

## 2018-05-31 DIAGNOSIS — I10 Essential (primary) hypertension: Secondary | ICD-10-CM | POA: Diagnosis not present

## 2018-05-31 DIAGNOSIS — K922 Gastrointestinal hemorrhage, unspecified: Secondary | ICD-10-CM

## 2018-05-31 DIAGNOSIS — D649 Anemia, unspecified: Secondary | ICD-10-CM

## 2018-05-31 LAB — COMPREHENSIVE METABOLIC PANEL
ALT: 6 U/L (ref 0–44)
AST: 20 U/L (ref 15–41)
Albumin: 3.5 g/dL (ref 3.5–5.0)
Alkaline Phosphatase: 152 U/L — ABNORMAL HIGH (ref 38–126)
Anion gap: 9 (ref 5–15)
BUN: 94 mg/dL — ABNORMAL HIGH (ref 8–23)
CO2: 15 mmol/L — ABNORMAL LOW (ref 22–32)
Calcium: 8.2 mg/dL — ABNORMAL LOW (ref 8.9–10.3)
Chloride: 112 mmol/L — ABNORMAL HIGH (ref 98–111)
Creatinine, Ser: 5.61 mg/dL — ABNORMAL HIGH (ref 0.61–1.24)
GFR calc Af Amer: 10 mL/min — ABNORMAL LOW (ref >60)
GFR calc non Af Amer: 9 mL/min — ABNORMAL LOW (ref >60)
Glucose, Bld: 144 mg/dL — ABNORMAL HIGH (ref 70–99)
Potassium: 5.4 mmol/L — ABNORMAL HIGH (ref 3.5–5.1)
Sodium: 136 mmol/L (ref 135–145)
Total Bilirubin: 0.2 mg/dL — ABNORMAL LOW (ref 0.3–1.2)
Total Protein: 6.1 g/dL — ABNORMAL LOW (ref 6.5–8.1)

## 2018-05-31 LAB — URINALYSIS, COMPLETE (UACMP) WITH MICROSCOPIC
Bacteria, UA: NONE SEEN
Bilirubin Urine: NEGATIVE
Glucose, UA: NEGATIVE mg/dL
Ketones, ur: NEGATIVE mg/dL
Nitrite: NEGATIVE
Protein, ur: 100 mg/dL — AB
Specific Gravity, Urine: 1.012 (ref 1.005–1.030)
WBC, UA: >50 WBC/hpf — ABNORMAL HIGH (ref 0–5)
pH: 5 (ref 5.0–8.0)

## 2018-05-31 LAB — CBC WITH DIFFERENTIAL/PLATELET
Abs Immature Granulocytes: 0.5 10*3/uL — ABNORMAL HIGH (ref 0.00–0.07)
Basophils Absolute: 0 10*3/uL (ref 0.0–0.1)
Basophils Relative: 0 %
Eosinophils Absolute: 0.1 10*3/uL (ref 0.0–0.5)
Eosinophils Relative: 2 %
HCT: 14 % — CL (ref 39.0–52.0)
Hemoglobin: 4.3 g/dL — CL (ref 13.0–17.0)
Immature Granulocytes: 8 %
Lymphocytes Relative: 25 %
Lymphs Abs: 1.5 10*3/uL (ref 0.7–4.0)
MCH: 31.6 pg (ref 26.0–34.0)
MCHC: 30.7 g/dL (ref 30.0–36.0)
MCV: 102.9 fL — ABNORMAL HIGH (ref 80.0–100.0)
Monocytes Absolute: 0.3 10*3/uL (ref 0.1–1.0)
Monocytes Relative: 6 %
Neutro Abs: 3.7 10*3/uL (ref 1.7–7.7)
Neutrophils Relative %: 59 %
Platelets: 81 10*3/uL — ABNORMAL LOW (ref 150–400)
RBC: 1.36 MIL/uL — ABNORMAL LOW (ref 4.22–5.81)
RDW: 19.4 % — ABNORMAL HIGH (ref 11.5–15.5)
WBC: 6.2 10*3/uL (ref 4.0–10.5)
nRBC: 2.9 % — ABNORMAL HIGH (ref 0.0–0.2)

## 2018-05-31 LAB — SARS CORONAVIRUS 2 BY RT PCR (HOSPITAL ORDER, PERFORMED IN ~~LOC~~ HOSPITAL LAB): SARS Coronavirus 2: NEGATIVE

## 2018-05-31 MED ORDER — SODIUM CHLORIDE 0.9 % IV SOLN: 10.0000 mL/h | Freq: Once | INTRAVENOUS | Status: DC

## 2018-05-31 NOTE — ED Provider Notes (Signed)
Patient and family subsequently state they do not want hospital admission.  He will receive a blood transfusion and then will follow-up as an outpatient.  He is renal failure and renal mass are not going to be pursued.   Earleen Newport, MD 05/31/18 2216

## 2018-05-31 NOTE — ED Notes (Signed)
Blood rx'd att

## 2018-05-31 NOTE — ED Notes (Signed)
Per admitting: pt to be DC'd after blood infusion

## 2018-05-31 NOTE — ED Notes (Signed)
Bladder scan showed 352 ml

## 2018-05-31 NOTE — ED Notes (Signed)
Margreta Journey, RN, daughter-in-law, talking to pt about preferences att

## 2018-05-31 NOTE — ED Notes (Signed)
T & S sent to blood bank att

## 2018-05-31 NOTE — ED Notes (Addendum)
Call to blood bank to verify only pink tube required, Joann, lab verfied

## 2018-05-31 NOTE — ED Triage Notes (Signed)
Pt to ED via POV c/o urinary retention. Pt is Hospice pt, hospice nurse came out and attempted to place catheter but was unable to. Pt has hx/o Prostate cancer. Pt son reports that there has been issues in the past where urology has had to be called to insert catheter. Pt is in NAD.

## 2018-05-31 NOTE — ED Provider Notes (Signed)
[facility] Emergency Department Provider Note       Time seen: ----------------------------------------- 6:21 PM on 05/31/2018 -----------------------------------------   I have reviewed the triage vital signs and the nursing notes.  HISTORY   Chief Complaint Urinary Retention    HPI Terry Wood. is a 85 y.o. male with a history of prostate cancer, diabetes and hypertension who presents to the ED for urinary retention.  He is a hospice patient, hospice nurse came out today and attempted to place a Foley catheter but was unable to.  Urology has had to be consulted in the past to place a Foley catheter.  He arrives in no acute distress.  Was able to urinate just as he got here but before that it had been a day or two.  Past Medical History:  Diagnosis Date  . Cancer of prostate ([place]) 07/13/2014  . Diabetes mellitus without complication ([place])   . Hypertension     Patient Active Problem List   Diagnosis Date Noted  . Hematuria 09/27/2017  . Urinary retention   . Hydronephrosis, left   . SBO (small bowel obstruction) ([place])   . Malnutrition of moderate degree 08/05/2017  . Acute kidney injury superimposed on chronic kidney disease ([place]) 08/04/2017  . Goals of care, counseling/discussion 12/03/2016  . Primary prostate cancer with metastasis from prostate to other site [facility]) 07/13/2014    History reviewed. No pertinent surgical history.  Allergies Patient has no known allergies.  Social History Social History   Tobacco Use  . Smoking status: Former Smoker    Types: Cigarettes    Last attempt to quit: 08/07/2017    Years since quitting: 0.8  . Smokeless tobacco: Never Used  Substance Use Topics  . Alcohol use: Not Currently    Alcohol/week: 0.0 standard drinks  . Drug use: No   Review of Systems Constitutional: Negative for fever. Cardiovascular: Negative for chest pain. Respiratory: Negative for shortness of breath. Gastrointestinal:  Negative for abdominal pain, vomiting and diarrhea. Genitourinary: Positive for urinary retention Musculoskeletal: Negative for back pain. Skin: Negative for rash. Neurological: Negative for headaches, focal weakness or numbness.  All systems negative/normal/unremarkable except as stated in the HPI  ____________________________________________   PHYSICAL EXAM:  VITAL SIGNS: ED Triage Vitals  Enc Vitals Group     BP 05/31/18 1639 (!) 105/31     Pulse Rate 05/31/18 1639 (!) 101     Resp 05/31/18 1639 16     Temp 05/31/18 1639 98.1 F (36.7 C)     Temp Source 05/31/18 1639 Oral     SpO2 05/31/18 1639 100 %     Weight --      Height --      Head Circumference --      Peak Flow --      Pain Score 05/31/18 1634 0     Pain Loc --      Pain Edu? --      Excl. in [place]? --    Constitutional: Alert and oriented.  Chronically ill-appearing, no distress Eyes: Conjunctivae are normal. Normal extraocular movements. Cardiovascular: Normal rate, regular rhythm. No murmurs, rubs, or gallops. Respiratory: Normal respiratory effort without tachypnea nor retractions. Breath sounds are clear and equal bilaterally. No wheezes/rales/rhonchi. Gastrointestinal: Mild suprapubic tenderness, no rebound or guarding.  Normal bowel sounds. Musculoskeletal: Nontender with normal range of motion in extremities.  Mild edema is noted Neurologic:  Normal speech and language. No gross focal neurologic deficits are appreciated.  Skin:  Skin is warm, dry and intact. No rash noted. Psychiatric: Mood and affect are normal. Speech and behavior are normal.  ___________________________________________  ED COURSE:  As part of my medical decision making, I reviewed the following data within the [place] History obtained from family if available, nursing notes, old chart and ekg, as well as notes from prior ED visits. Patient presented for urinary retention, we will assess with labs and imaging as  indicated at this time.   Procedures  Terry Caul. was evaluated in Emergency Department on 05/31/2018 for the symptoms described in the history of present illness. He was evaluated in the context of the global COVID-19 pandemic, which necessitated consideration that the patient might be at risk for infection with the SARS-CoV-2 virus that causes COVID-19. Institutional protocols and algorithms that pertain to the evaluation of patients at risk for COVID-19 are in a state of rapid change based on information released by regulatory bodies including the CDC and federal and state organizations. These policies and algorithms were followed during the patient's care in the ED.  ____________________________________________   LABS (pertinent positives/negatives)  Labs Reviewed  CBC WITH DIFFERENTIAL/PLATELET - Abnormal; Notable for the following components:      Result Value   RBC 1.36 (*)    Hemoglobin 4.3 (*)    HCT 14.0 (*)    MCV 102.9 (*)    RDW 19.4 (*)    Platelets 81 (*)    nRBC 2.9 (*)    All other components within normal limits  COMPREHENSIVE METABOLIC PANEL - Abnormal; Notable for the following components:   Potassium 5.4 (*)    Chloride 112 (*)    CO2 15 (*)    Glucose, Bld 144 (*)    BUN 94 (*)    Creatinine, Ser 5.61 (*)    Calcium 8.2 (*)    Total Protein 6.1 (*)    Alkaline Phosphatase 152 (*)    Total Bilirubin 0.2 (*)    GFR calc non Af Amer 9 (*)    GFR calc Af Amer 10 (*)    All other components within normal limits  URINE CULTURE  URINALYSIS, COMPLETE (UACMP) WITH MICROSCOPIC  TYPE AND SCREEN  PREPARE RBC (CROSSMATCH)   CRITICAL CARE Performed by: Laurence Aly   Total critical care time: 30 minutes  Critical care time was exclusive of separately billable procedures and treating other patients.  Critical care was necessary to treat or prevent imminent or life-threatening deterioration.  Critical care was time spent personally by me on the  following activities: development of treatment plan with patient and/or surrogate as well as nursing, discussions with consultants, evaluation of patient's response to treatment, examination of patient, obtaining history from patient or surrogate, ordering and performing treatments and interventions, ordering and review of laboratory studies, ordering and review of radiographic studies, pulse oximetry and re-evaluation of patient's condition.  RADIOLOGY Images were viewed by me  Bladder scan revealed 350 cc of urine with recent void Subsequently he voided several times. ____________________________________________   DIFFERENTIAL DIAGNOSIS   Acute urinary retention, UTI, prostate cancer, pyelonephritis, sepsis, dehydration, kidney failure  FINAL ASSESSMENT AND PLAN  Acute urinary retention, acute renal failure likely secondary to same   Plan: The patient had presented for acute urinary retention.  Initially we were planning on placing a Foley catheter but he was able to urinate well here.  Patient's labs did reveal significant elevation in his creatinine.  We have ordered a renal  ultrasound.  Other labs reflect renal failure.  He is also severely anemic with a hemoglobin of 4.3.  I performed a rectal examination and he was heme positive.  Patient reports he has been seeing black stools for some time that he attributed to his poor diet.  Have ordered 2 units of blood for him.  I will discuss with the hospitalist for admission.   Laurence Aly, MD    Note: This note was generated in part or whole with voice recognition software. Voice recognition is usually quite accurate but there are transcription errors that can and very often do occur. I apologize for any typographical errors that were not detected and corrected.     Earleen Newport, MD 05/31/18 [phone]

## 2018-05-31 NOTE — ED Notes (Signed)
Peripheral IV discontinued. Catheter intact. No signs of infiltration or redness. Gauze applied to IV site.   Discharge instructions reviewed with patient. Questions fielded by this RN. Patient verbalizes understanding of instructions. Patient discharged home in stable condition per williams. No acute distress noted at time of discharge.   Pt wheeled to son, Ronalee Belts, in ED front.  DC plan discussed with pt and family.  Pt with NAD.

## 2018-05-31 NOTE — ED Notes (Signed)
165 ml present in bladder on bladder scan. Pt reports that when he got in the car he did pass a good amount of urine.

## 2018-05-31 NOTE — ED Notes (Signed)
Patient transported to Ultrasound 

## 2018-05-31 NOTE — ED Notes (Signed)
Terry Wood, blood bank, receipt of T&S

## 2018-05-31 NOTE — Consult Note (Signed)
[facility] [place] NAME: Terry Wood    MR#:  [id number]  DATE OF BIRTH:  [birth year]  DATE OF CONSULT:  05/31/2018  PRIMARY CARE PHYSICIAN: System, Pcp Not In   REQUESTING/REFERRING PHYSICIAN: Dr. Lenise Arena.   CHIEF COMPLAINT:   Chief Complaint  Patient presents with  . Urinary Retention    HISTORY OF PRESENT ILLNESS:  Terry Wood  is a 85 y.o. male with a known history of prostate cancer, previous history of renal mass, diabetes who is currently followed by hospice at home presented to the hospital today due to difficulty voiding/urinary retention.  Hospice nurse at home attempted to put a Foley catheter in him but she was not successful and therefore he was sent to the ER for further evaluation.  Patient when he presented to the ER had already voided a couple times.  His bladder scan showed over 350 cc of urine but he had already voided 2 or 3 times in the ER.  His blood work incidentally showed significant anemia and also acute kidney injury.  His renal ultrasound showed moderate hydronephrosis on the left and also a right sided large renal mass which is unchanged from his previous ultrasound last year.  Clinically patient presently denies any abdominal pain nausea vomiting fevers chills cough or any other associated symptoms presently.  Hospitalist services were initially consulted for admission.  PAST MEDICAL HISTORY:   Past Medical History:  Diagnosis Date  . Cancer of prostate ([place]) 07/13/2014  . Diabetes mellitus without complication ([place])   . Hypertension     PAST SURGICAL HISTOIRY:  History reviewed. No pertinent surgical history.  SOCIAL HISTORY:   Social History   Tobacco Use  . Smoking status: Current Every Day Smoker    Packs/day: 0.50    Years: 50.00    Pack years: 25.00    Types: Cigarettes    Last attempt to quit: 08/07/2017    Years since quitting: 0.8  . Smokeless tobacco: Never Used  Substance Use  Topics  . Alcohol use: Not Currently    Alcohol/week: 0.0 standard drinks    FAMILY HISTORY:   Family History  Problem Relation Age of Onset  . Heart failure Mother   . Heart failure Father     DRUG ALLERGIES:  No Known Allergies  REVIEW OF SYSTEMS:   Review of Systems  Constitutional: Negative for fever and weight loss.  HENT: Negative for congestion, nosebleeds and tinnitus.   Eyes: Negative for blurred vision, double vision and redness.  Respiratory: Negative for cough, hemoptysis and shortness of breath.   Cardiovascular: Negative for chest pain, orthopnea, leg swelling and PND.  Gastrointestinal: Negative for abdominal pain, diarrhea, melena, nausea and vomiting.  Genitourinary: Negative for dysuria, hematuria and urgency.       Urinary Retention.   Musculoskeletal: Negative for falls and joint pain.  Neurological: Negative for dizziness, tingling, sensory change, focal weakness, seizures, weakness and headaches.  Endo/Heme/Allergies: Negative for polydipsia. Does not bruise/bleed easily.  Psychiatric/Behavioral: Negative for depression and memory loss. The patient is not nervous/anxious.      MEDICATIONS AT HOME:   Prior to Admission medications   Medication Sig Start Date End Date Taking? Authorizing Provider  acetaminophen (TYLENOL) 650 MG CR tablet Take 1,300 mg by mouth daily as needed for pain.    [provider]  allopurinol (ZYLOPRIM) 100 MG tablet Take 100 mg by mouth daily.  06/16/17   [provider]  HYDROcodone-acetaminophen (  NORCO/VICODIN) 5-325 MG tablet Take 1 tablet by mouth every 6 (six) hours as needed for moderate pain. 05/28/18   Lloyd Huger, MD  ibuprofen (ADVIL,MOTRIN) 200 MG tablet Take 200 mg by mouth as needed.    [provider]  Omega-3 Fatty Acids (FISH OIL) 1000 MG CAPS Take 2,000 mg by mouth 2 (two) times daily.     [provider]  omeprazole (PRILOSEC) 20 MG capsule Take 1 capsule (20 mg total)  by mouth daily. 05/12/18   Lloyd Huger, MD  tamsulosin (FLOMAX) 0.4 MG CAPS capsule Take 0.4 mg by mouth 2 (two) times daily.    [provider]  traMADol (ULTRAM) 50 MG tablet TAKE (1) TABLET BY MOUTH EVERY SIX HOURS AS NEEDED FOR PAIN. 05/06/18   Borders, Kirt Boys, NP  prochlorperazine (COMPAZINE) 10 MG tablet Take 1 tablet (10 mg total) every 6 (six) hours as needed by mouth (Nausea or vomiting). 11/15/16 06/04/17  Lloyd Huger, MD      VITAL SIGNS:  Blood pressure (!) 113/49, pulse 83, temperature 98.3 F (36.8 C), temperature source Oral, resp. rate 16, SpO2 98 %.  PHYSICAL EXAMINATION:  GENERAL:  85 y.o.-year-old patient lying in the bed in no acute distress.  EYES: Pupils equal, round, reactive to light and accommodation. No scleral icterus. Extraocular muscles intact. Pale conjunctiva.  HEENT: Head atraumatic, normocephalic. Oropharynx and nasopharynx clear.  NECK:  Supple, no jugular venous distention. No thyroid enlargement, no tenderness.  LUNGS: Normal breath sounds bilaterally, no wheezing, rales, rhonchi . No use of accessory muscles of respiration.  CARDIOVASCULAR: S1, S2, RRR. No murmurs, rubs, gallops, clicks.  ABDOMEN: Soft, nontender, slightly distended. Bowel sounds present. No organomegaly or mass.  EXTREMITIES: No pedal edema, cyanosis, or clubbing.  NEUROLOGIC: Cranial nerves II through XII are intact. No focal motor or sensory deficits appreciated bilaterally  PSYCHIATRIC: The patient is alert and oriented x 3.    SKIN: No obvious rash, lesion, or ulcer.   LABORATORY PANEL:   CBC Recent Labs  Lab 05/31/18 1803  WBC 6.2  HGB 4.3*  HCT 14.0*  PLT 81*   ------------------------------------------------------------------------------------------------------------------  Chemistries  Recent Labs  Lab 05/31/18 1803  NA 136  K 5.4*  CL 112*  CO2 15*  GLUCOSE 144*  BUN 94*  CREATININE 5.61*  CALCIUM 8.2*  AST 20  ALT 6  ALKPHOS 152*   BILITOT 0.2*   ------------------------------------------------------------------------------------------------------------------  Cardiac Enzymes No results for input(s): TROPONINI in the last 168 hours. ------------------------------------------------------------------------------------------------------------------  RADIOLOGY:  US Renal  Result Date: 05/31/2018 CLINICAL DATA:  Acute renal failure EXAM: RENAL / URINARY TRACT ULTRASOUND COMPLETE COMPARISON:  CT abdomen 08/09/2017, ultrasound kidneys 08/06/2017 FINDINGS: Right Kidney: Renal measurements: 15 x 6.1 x 6.4 cm = volume: 305 mL. Increased renal cortical echogenicity. Moderate hydronephrosis. 4.2 x 3 x 5.6 cm anechoic lower pole renal mass consistent with a cyst. 3.7 x 3.2 x 4.1 cm hypoechoic right inferior pole renal mass with internal doppler flow. Left Kidney: Renal measurements: 12.5 x 6.5 x 6.4 cm = volume: 270 mL. Echogenicity within normal limits. Mild-moderate left hydronephrosis. Anechoic 3.1 x 2.9 x 3.3 cm upper pole renal mass consistent with a cyst. Anechoic 2.8 x 3.6 x 3.8 cm lower pole renal mass consistent with a cyst. Bladder: Prostate gland is enlarged with mass effect upon the bladder base. Bladder is partially decompressed. IMPRESSION: 1. Moderate right and mild-moderate left hydronephrosis. 2. Solid right inferior pole renal mass measuring 3.2 x 3.7 x  4.1 cm most concerning for renal cell carcinoma. Electronically Signed   By: Kathreen Devoid   On: 05/31/2018 20:29     IMPRESSION AND PLAN:   85 year old male with past medical history of prostate cancer, previous history of a renal mass, BPH, previous history of urinary retention, history of gout who presented to the hospital due to difficulty urinating/urinary retention.  1.  Urinary retention- secondary to previous history of prostate cancer/BPH. - Patient had a bladder scan done which showed 350 cc of urine but patient was shortly after coming to the ER able to  void.  He is in no abdominal pain or any distress.  -Continue Flomax, hold off on Foley catheter for now.  2.  Acute renal failure-patient has a creatinine of over 5.  Patient's renal ultrasound showed a renal mass which he has had previously and moderate left-sided hydronephrosis. - Patient is urinating presently and does not want any aggressive intervention.  3.  Right-sided renal mass-unchanged from previous. 4.  History of prostate cancer-currently followed by hospice services at home. 5.  Anemia- patient presented to the hospital with a hemoglobin of 4.3 with hemoglobin in September of last year around 9 and also had heme positive stools. 6.  GI bleed 7.  History of gout  Due to patient's prostate cancer and right renal mass patient is already followed by hospice services at home.  Patient's daughter-in-law works in the hospital here and spoke to him in detail and patient does not want any aggressive intervention.  He does not want to have a gastroenterology consult for his GI bleed or a nephrology consult.  Patient's family and patient want to pursue just hospice services. - Patient is going to be transfused just 1 unit of packed red blood cells for significant anemia and then he will be discharged home to continue follow-up with hospice services.  He is in no abdominal pain or distress and is currently voiding well and he does not want a Foley catheter.  Patient's daughter-in-law who is a nurse can attempt to place a Foley catheter if needed at home.  Plan discussed with the patient, patient's daughter-in-law and patient's son over the phone.  Also notified ER physician the patient is going to be discharged home after he gets transfused.  All the records are reviewed and case discussed with Consulting provider. Management plans discussed with the patient, family and they are in agreement.  CODE STATUS: DNR  TOTAL TIME TAKING CARE OF THIS PATIENT: 50 minutes.    Henreitta Leber M.D on  05/31/2018 at 9:13 PM  Between 7am to 6pm - Pager - [phone]  After 6pm go to [email or web address] - password [email or web address] [facility]  Office  [phone]  CC: Primary care Physician: System, Pcp Not In

## 2018-05-31 NOTE — ED Notes (Addendum)
Pt states he has not ben able to urinate for a couple days but when he got out of the vehicle he urinated in his pants- pt states that when he urinated on himself that it burned

## 2018-06-01 LAB — BPAM RBC
Blood Product Expiration Date: 202006042359
Blood Product Expiration Date: 202006052359
ISSUE DATE / TIME: 202005242039
Unit Type and Rh: 600
Unit Type and Rh: 600

## 2018-06-01 LAB — TYPE AND SCREEN
ABO/RH(D): A POS
Antibody Screen: NEGATIVE
Unit division: 0
Unit division: 0

## 2018-06-01 LAB — PREPARE RBC (CROSSMATCH)

## 2018-06-02 ENCOUNTER — Telehealth: Payer: Self-pay | Admitting: *Deleted

## 2018-06-02 LAB — URINE CULTURE

## 2018-06-02 MED ORDER — OXYCODONE HCL 10 MG PO TABS: 10.0000 mg | ORAL_TABLET | ORAL | 0 refills | Status: AC | PRN

## 2018-06-02 NOTE — Telephone Encounter (Signed)
Lorriane Shire informed of Oxycodone ordered

## 2018-06-02 NOTE — Telephone Encounter (Signed)
Oxycodone sent to [facility].  Not much we can do about his urinary retention. Continue foley care.

## 2018-06-02 NOTE — Telephone Encounter (Signed)
Vanessa with Hospice called asking for order for Oxycodone 10 mg for this patient having increasing pain in shoulder and back. Norco and Tramadol are not working. He had to go to ER this weekend because he could not urinate and Hospice could not get Foley inserted. Please advise.

## 2018-06-10 ENCOUNTER — Other Ambulatory Visit: Payer: Self-pay | Admitting: *Deleted

## 2018-06-10 MED ORDER — MORPHINE SULFATE (CONCENTRATE) 20 MG/ML PO SOLN: 5.0000 mg | ORAL | 0 refills | Status: AC | PRN

## 2018-06-10 NOTE — Telephone Encounter (Signed)
Left a message for hospice nurse. Patient may benefit from a long acting opioid if he is requiring hourly dosing of morphine elixir.

## 2018-06-10 NOTE — Telephone Encounter (Signed)
Patient has been using Roxanol every hour. Please refill per hospice request

## 2018-06-10 NOTE — Telephone Encounter (Signed)
I spoke with Lorriane Shire, RN with hospice. It sounds like patient is actively dying. He was poorly responsive during the visit today but has facial grimacing and grunting with movement/breathing. Extremities cool. He is unable to swallow pills. He is using morphine elixir 10mg /hourly. Agree with morphine elixir for comfort.

## 2018-06-11 ENCOUNTER — Telehealth: Payer: Self-pay | Admitting: *Deleted

## 2018-06-11 ENCOUNTER — Encounter: Payer: Self-pay | Admitting: Oncology

## 2018-06-11 NOTE — Telephone Encounter (Signed)
Terry Wood called to report that patient expired at 430 on 06/20/2018

## 2018-07-08 DEATH — deceased

## 2019-08-12 IMAGING — CT CT ABD-PELV W/ CM
2 of 5 series · 12 of 36 positions shown, 15 images · IV contrast (iopamidol)
Comparison: Multiple exams, including 07/20/2014

CLINICAL DATA: Prostate cancer with extracapsular extension and
pathologic adenopathy. Liver lesion, possibly metastatic. Restaging
assessment.

EXAM:
CT CHEST, ABDOMEN, AND PELVIS WITH CONTRAST
TECHNIQUE: Multidetector CT imaging of the chest, abdomen and pelvis was
performed following the standard protocol during bolus
administration of intravenous contrast.
CONTRAST:  100mL GKYJ7Y-APP IOPAMIDOL (GKYJ7Y-APP) INJECTION 61%

[Series 2: cap with · axial · 0.88mm/px · z∈[-798,-228]mm · 9 of 140 slices shown, 12 images]
[im 13/140  mediastinal]
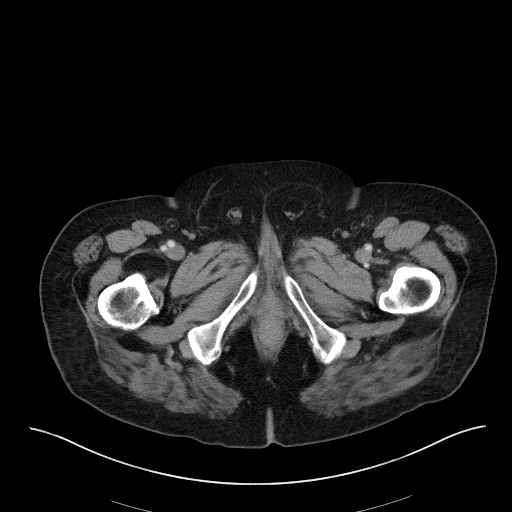
[im 13/140  lung]
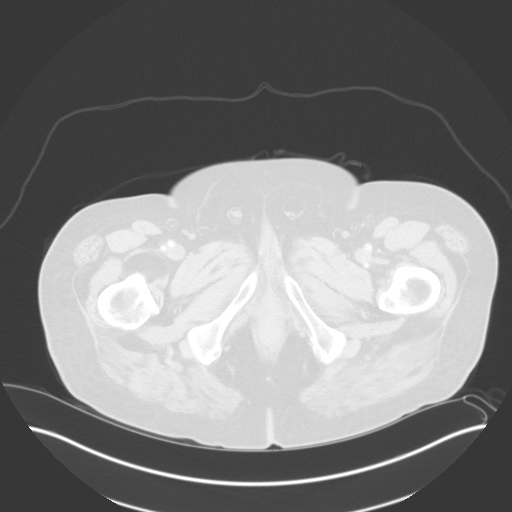
[im 26/140  lung]
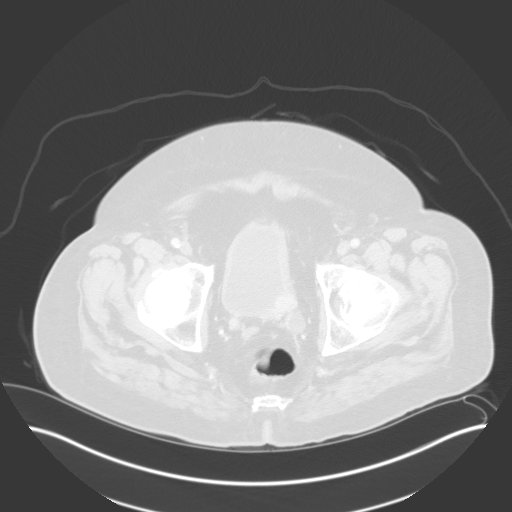
[im 38/140  lung]
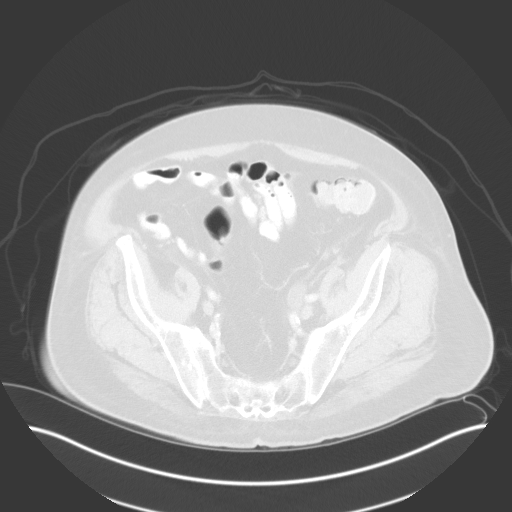
[im 51/140  lung]
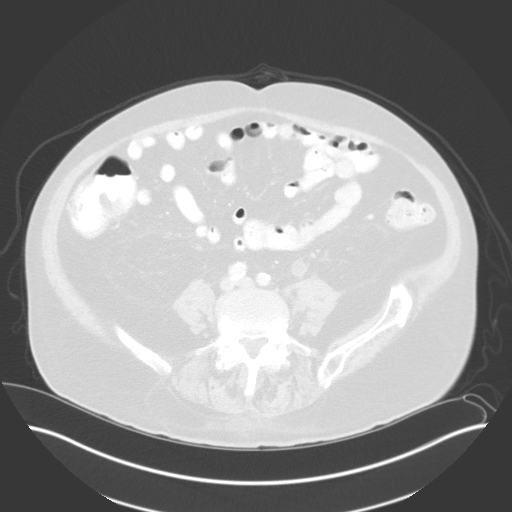
[im 76/140  mediastinal]
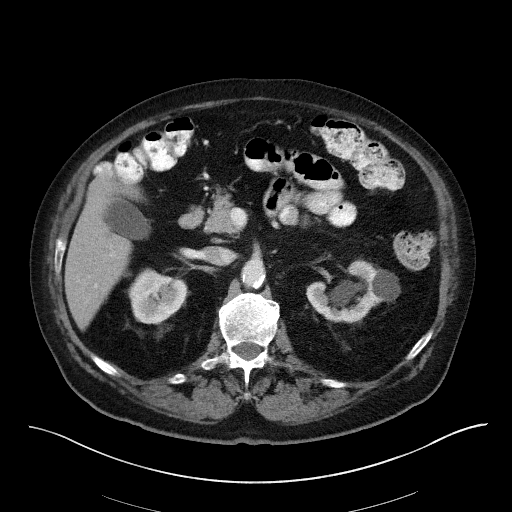
[im 76/140  lung]
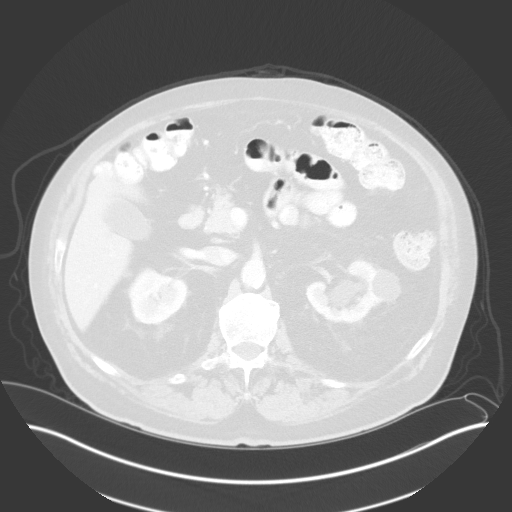
[im 89/140  lung]
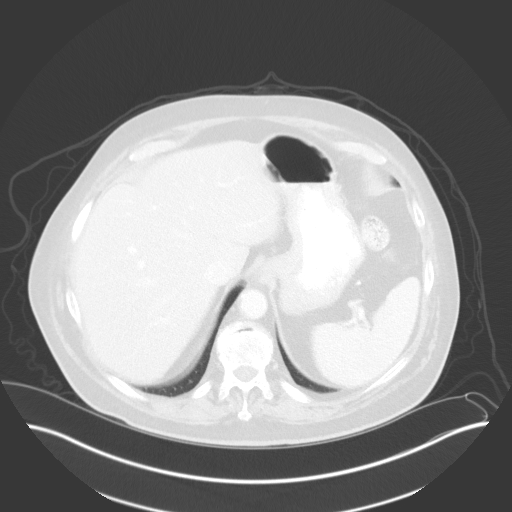
[im 102/140  lung]
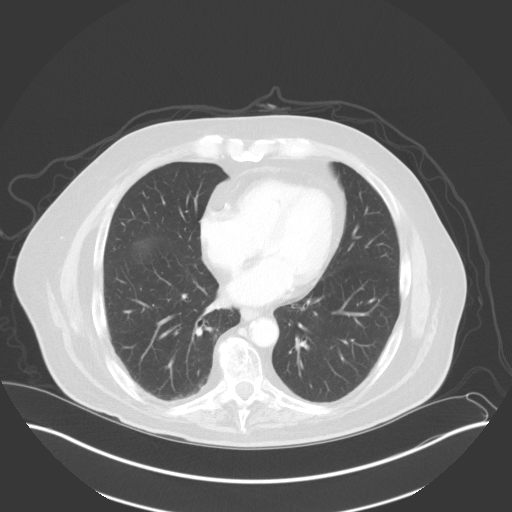
[im 114/140  lung]
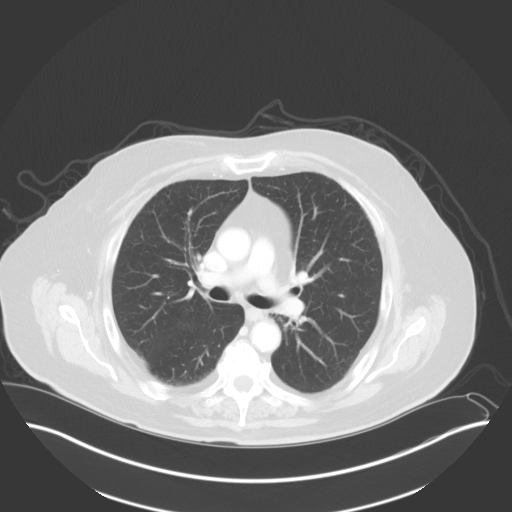
[im 127/140  mediastinal]
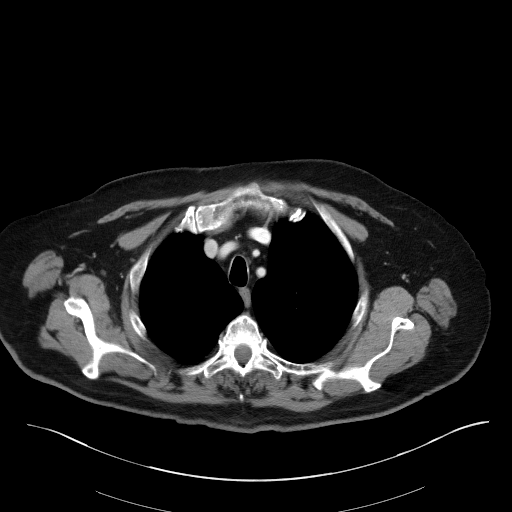
[im 127/140  lung]
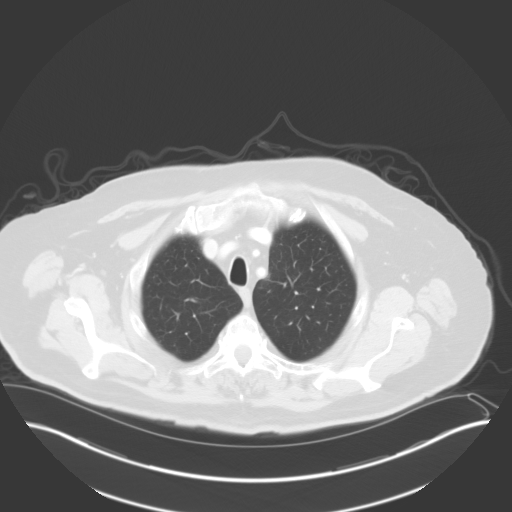

[Series 5: coronals · coronal · 0.83mm/px · 3 of 150 slices shown]
[im 30/150  lung]
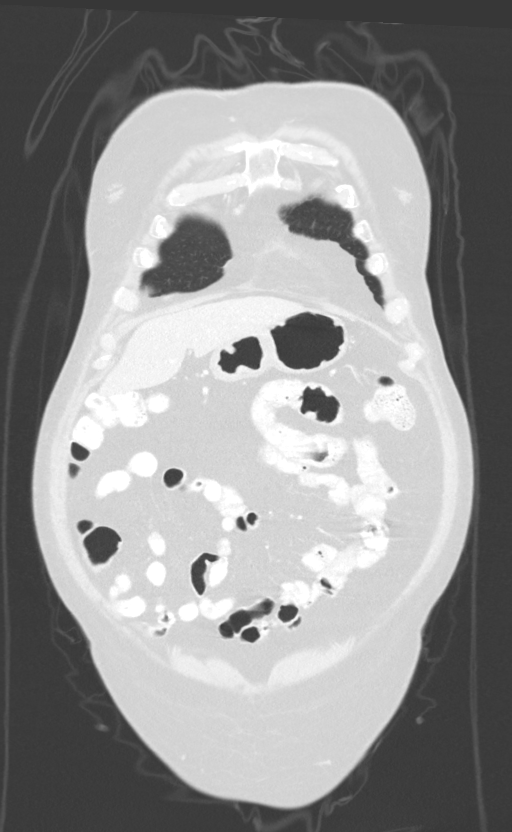
[im 60/150  lung]
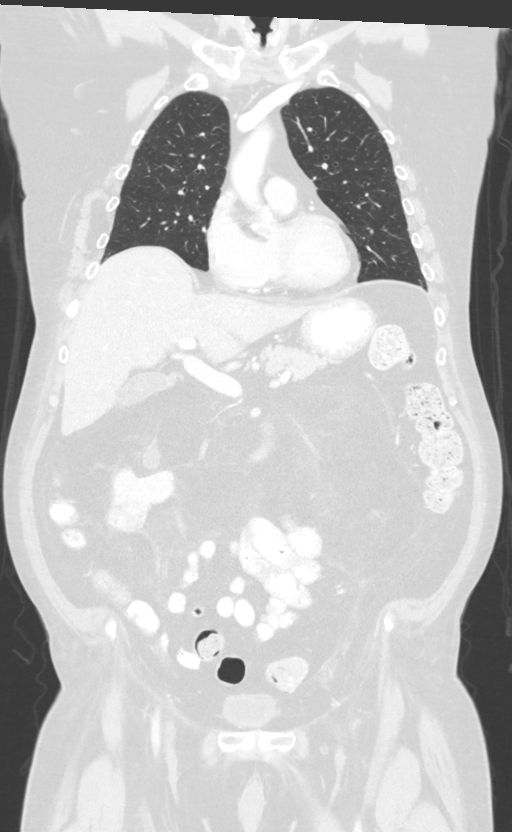
[im 90/150  lung]
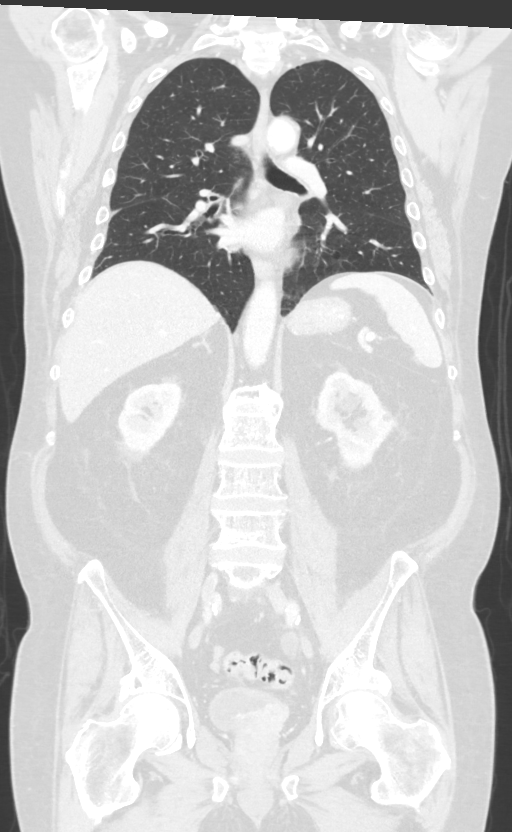

[12 of 36 positions shown; findings below may reference images not displayed]

FINDINGS: CT CHEST FINDINGS

Cardiovascular: Coronary, aortic arch, and branch vessel
atherosclerotic vascular disease.

Mediastinum/Nodes: Small subcarinal lymph nodes are present. No
pathologic thoracic adenopathy.

Lungs/Pleura: 0.5 by 0.4 cm left upper lobe nodule on image 72/4
posteriorly.

Musculoskeletal: Abnormal sclerosis in the right eighth rib with
questionable pathologic fracture on image 123/4. Subtle nearby
sclerosis in the right seventh rib and right ninth rib very subtle
sclerosis laterally in the left seventh rib. Thoracic spondylosis.

CT ABDOMEN PELVIS FINDINGS

Hepatobiliary: Multiple small gallstones layering dependently in the
gallbladder. Otherwise unremarkable.

Pancreas: Unremarkable

Spleen: Unremarkable

Adrenals/Urinary Tract: 3.3 by 3.6 by 3.3 cm enhancing partially
exophytic mass in the right kidney lower pole favoring renal cell
carcinoma.

Bilateral renal cysts. Slightly non rotated right kidney. Adrenal
glands normal.

Moderate left hydronephrosis and hydroureter extending down she to a
mass of the left UVJ region which appears contiguous with the
prostate gland. The overall mass extending from the prostate
measures approximately 4.3 by 2.6 by 3.0 cm. Appearance concerning
for direct prostate cancer invasion of the left UVJ and resulting in
obstruction. Bladder tumor is considered less likely.

Stomach/Bowel: Unremarkable

Vascular/Lymphatic: Aortoiliac atherosclerotic vascular disease.
Chronic mural thrombus anteriorly at the bifurcation.

Reproductive: As noted above, there is an enhancing mass extending
from the base of the prostate gland into the left posterior urinary
bladder and left UVJ, measuring about 4.3 by 2.6 by 3.0 cm.

Other: No supplemental non-categorized findings.

Musculoskeletal: Lipoma along the lateral margin of the right distal
iliopsoas.

There is degenerative disc disease and facet spurring causing
impingement at L4-5 and L5-S1.

The lesion on bone scan along the right anterior iliac crests is not
have a well-defined CT correlate.

The lesion in the left sacrum seen on bone scan does not have a
well-defined CT correlate.

The subtle lesion along the right side of the L5 vertebral body on
the bone scan does not have an apparent CT correlate.
IMPRESSION: 1. A locally invasive mass from the left prostate base extends up
into the urinary bladder and appears to invade the left UVJ, causing
moderate left hydronephrosis and hydroureter.
2. Right kidney lower pole mass measures 3.6 cm in diameter and
probably represents a synchronous renal cell carcinoma. No tumor
thrombus in the right renal vein. No associated periaortic
adenopathy.
3. Abnormal sclerosis in the right eighth rib with possible small
pathologic fracture. Subtle adjacent sclerotic lesions in the
seventh and ninth ribs. Appearance suspicious for prostate
metastatic disease. Other lesions seen on today's bone scan along
the right iliac crest, left sacrum, and right side of the L5
vertebral body do not have a definite CT correlate.
4. Small left upper lobe pulmonary nodule at 5 by 4 mm, quite likely
benign but meriting surveillance given the patient's active prostate
cancer.
5. Other imaging findings of potential clinical significance: Aortic
Atherosclerosis ([id number]). Coronary atherosclerosis.
Cholelithiasis. Bilateral renal cysts. The lumbar spondylosis and
degenerative disc disease cause impingement at L4-5 and L5-S1.

## 2019-08-12 IMAGING — NM NM BONE WHOLE BODY
2 series · 10 of 10 positions shown · non-contrast
Comparison: 12/15/2013

Radiographic correlation:  CT chest abdomen pelvis 11/12/2016

CLINICAL DATA: Metastatic prostate cancer

EXAM:
NUCLEAR MEDICINE WHOLE BODY BONE SCAN
TECHNIQUE: Whole body anterior and posterior images were obtained approximately
3 hours after intravenous injection of radiopharmaceutical.
RADIOPHARMACEUTICALS:  23.69 mCi 7echnetium-XXm MDP IV

[Series 1000: statics · 2.40mm/px · 4 acquisitions, 8 frames shown]
[im 1/4]
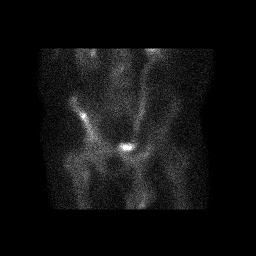
[im 1/4]
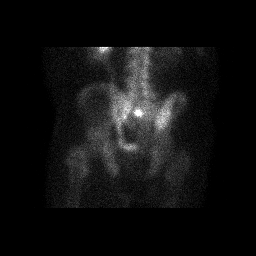
[im 2/4]
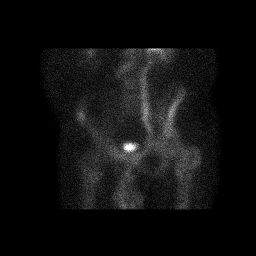
[im 2/4]
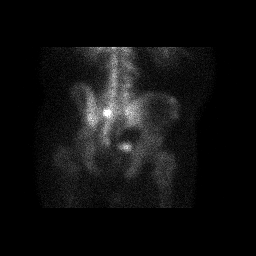
[im 3/4]
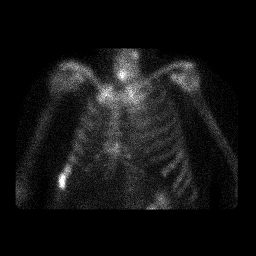
[im 3/4]
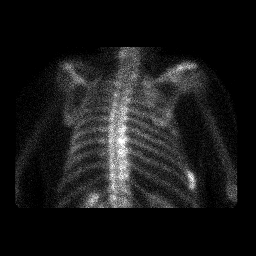
[im 4/4]
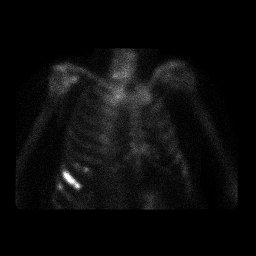
[im 4/4]
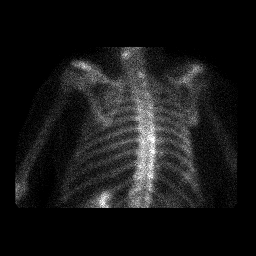

[Series 1000: 3 hr wholebody · 2.40mm/px · 2 of 2 frames shown]
[frame 1/2]
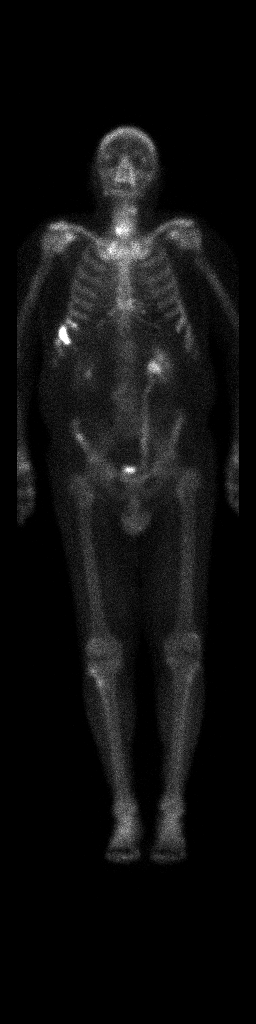
[frame 2/2]
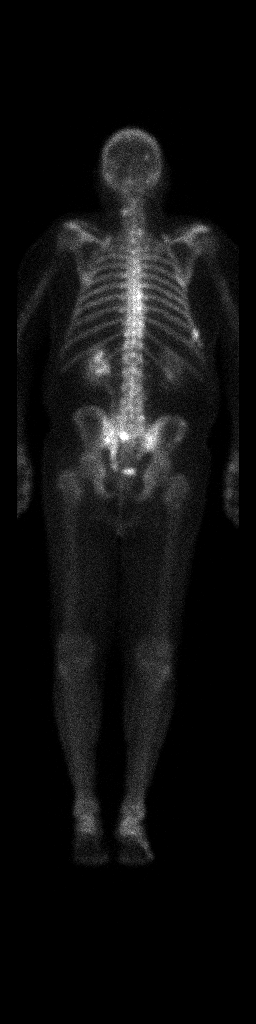

[10 of 10 positions shown; findings below may reference images not displayed]

FINDINGS: Multiple foci of abnormal osseous tracer accumulation are
identified.

These include lower anterolateral RIGHT ribs, sacrum, LEFT SI joint,
RIGHT iliac crest, and calvarium consistent with osseous metastasis.

The sites at the sacrum, lower RIGHT ribs, LEFT SI joint, and RIGHT
iliac bone are new.

Minimal degenerative type uptake laterally in the cervical and
lumbar spine.

No additional sites of concerning abnormal osseous tracer
accumulation are identified.

LEFT hydronephrosis and hydroureter new since prior study, also
identified on CT.

Remaining urinary tract and soft tissue distribution of tracer is
unremarkable.
IMPRESSION: New sites of abnormal osseous tracer accumulation identified in
lower RIGHT ribs, sacrum, LEFT SI joint, and RIGHT iliac bone
consistent with osseous metastases.

LEFT hydronephrosis and hydroureter corresponding to distal LEFT
ureteral obstruction identified on prior CT.
